# Patient Record
Sex: Female | Born: 1970
Health system: Southern US, Community
[De-identification: ages and names within clinical notes are randomized; demographics above are authoritative.]

## PROBLEM LIST (undated history)

## (undated) DIAGNOSIS — Z803 Family history of malignant neoplasm of breast: Secondary | ICD-10-CM

## (undated) DIAGNOSIS — Z923 Personal history of irradiation: Secondary | ICD-10-CM

## (undated) DIAGNOSIS — C50919 Malignant neoplasm of unspecified site of unspecified female breast: Secondary | ICD-10-CM

## (undated) DIAGNOSIS — O24419 Gestational diabetes mellitus in pregnancy, unspecified control: Secondary | ICD-10-CM

## (undated) HISTORY — PX: BREAST EXCISIONAL BIOPSY: SUR124

## (undated) HISTORY — DX: Malignant neoplasm of unspecified site of unspecified female breast: C50.919

## (undated) HISTORY — PX: BREAST BIOPSY: SHX20

## (undated) HISTORY — DX: Family history of malignant neoplasm of breast: Z80.3

---

## 2018-03-30 DIAGNOSIS — C50919 Malignant neoplasm of unspecified site of unspecified female breast: Secondary | ICD-10-CM

## 2018-03-30 HISTORY — DX: Malignant neoplasm of unspecified site of unspecified female breast: C50.919

## 2018-08-03 ENCOUNTER — Other Ambulatory Visit: Payer: Self-pay | Admitting: Family Medicine

## 2018-08-03 DIAGNOSIS — N6310 Unspecified lump in the right breast, unspecified quadrant: Secondary | ICD-10-CM

## 2018-08-09 ENCOUNTER — Other Ambulatory Visit: Payer: Self-pay

## 2018-08-12 ENCOUNTER — Other Ambulatory Visit: Payer: Self-pay

## 2018-08-30 ENCOUNTER — Ambulatory Visit
Admission: RE | Admit: 2018-08-30 | Discharge: 2018-08-30 | Disposition: A | Discharge status: Home / Self Care | Payer: Self-pay | Source: Ambulatory Visit | Attending: Family Medicine | Admitting: Family Medicine

## 2018-08-30 ENCOUNTER — Other Ambulatory Visit: Payer: Self-pay | Admitting: Family Medicine

## 2018-08-30 ENCOUNTER — Other Ambulatory Visit: Payer: Self-pay

## 2018-08-30 DIAGNOSIS — N6310 Unspecified lump in the right breast, unspecified quadrant: Secondary | ICD-10-CM

## 2018-08-30 DIAGNOSIS — R921 Mammographic calcification found on diagnostic imaging of breast: Secondary | ICD-10-CM

## 2018-08-30 IMAGING — MG DIGITAL DIAGNOSTIC BILATERAL MAMMOGRAM WITH TOMO AND CAD
8 of 11 series · 8 of 27 positions shown · non-contrast
Comparison: Outside mammogram [DATE]

CLINICAL DATA: 48-year-old patient presents for palpable area of
concern in the axillary tail of the right breast. She has cyclical
bilateral breast tenderness. Most recent mammogram [DATE].

EXAM:
DIGITAL DIAGNOSTIC BILATERAL MAMMOGRAM WITH CAD AND TOMO
ULTRASOUND RIGHT BREAST

[L CC (1 of 2)]
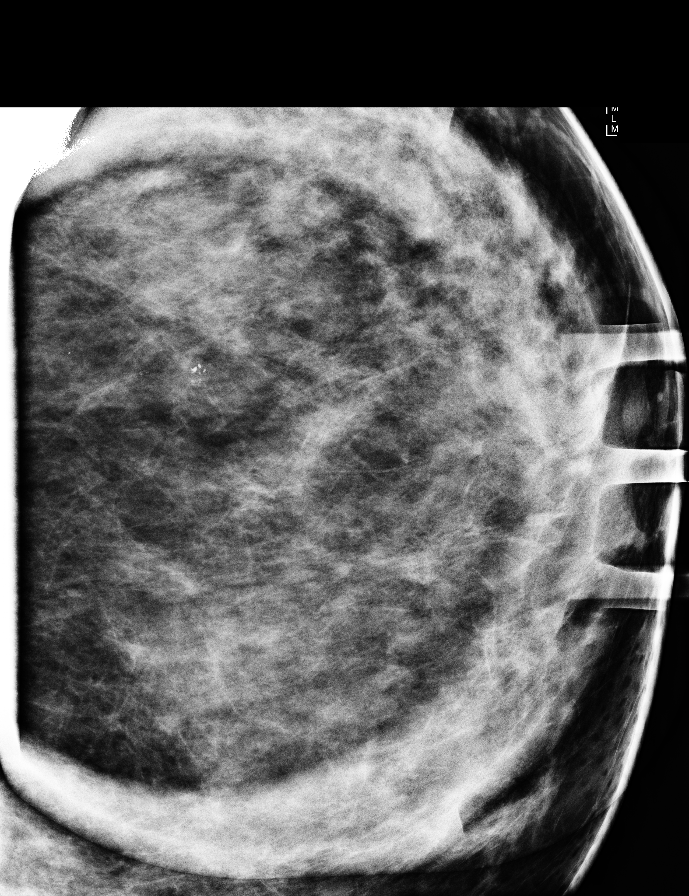

[L ML]
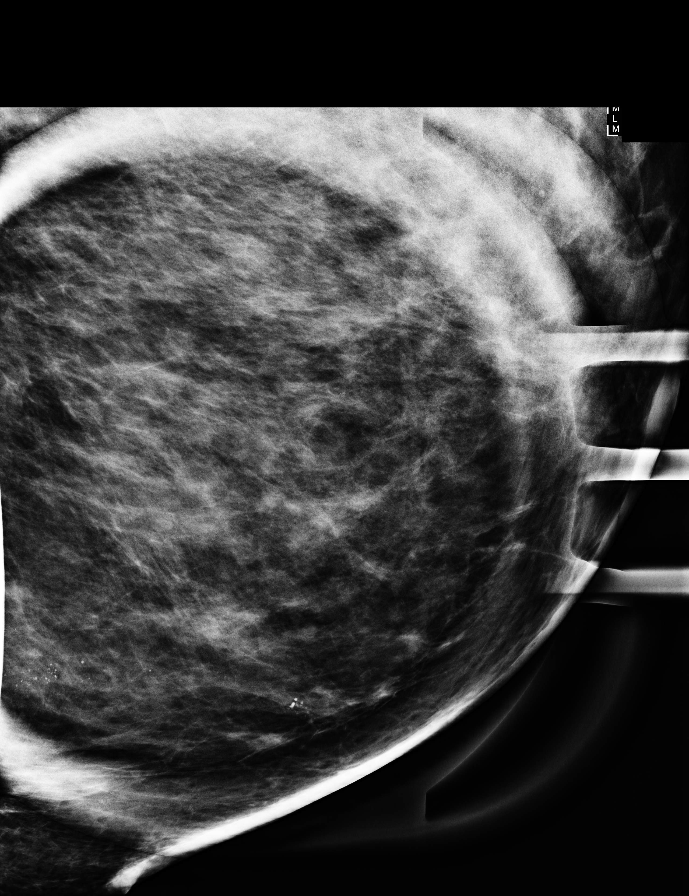

[L CC (2 of 2)]
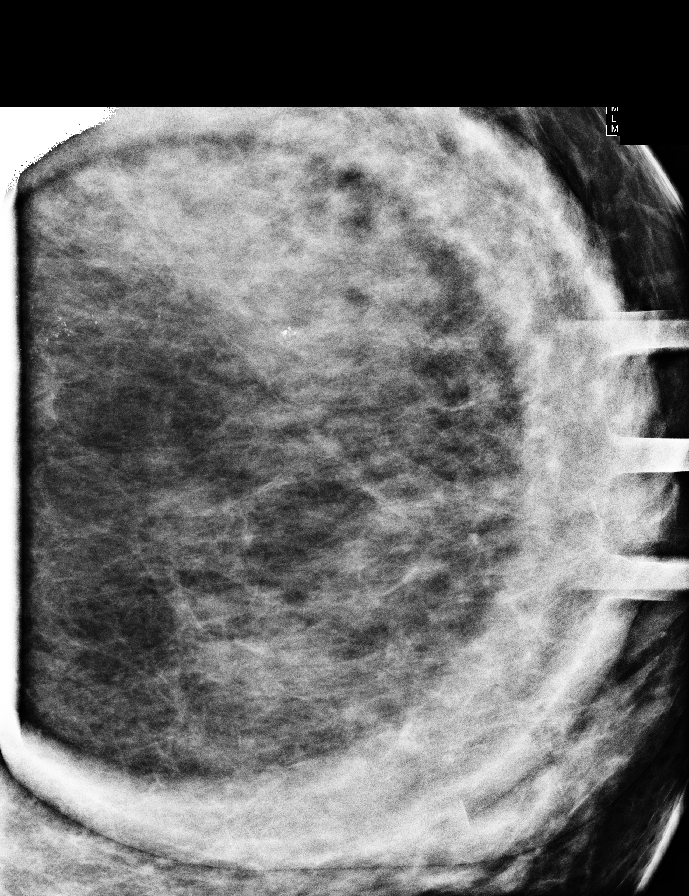

[R TAN synth-2D]
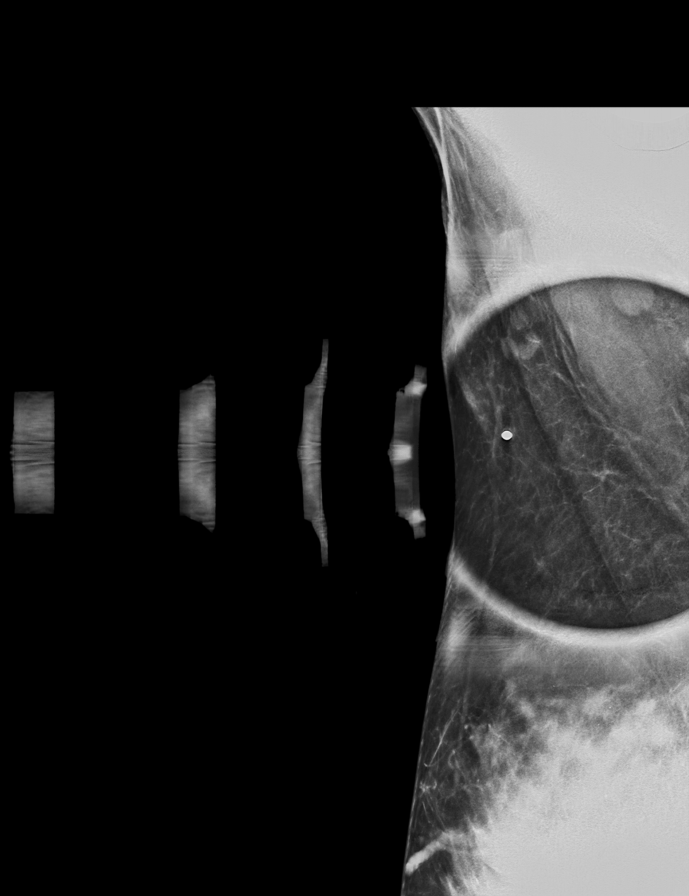

[R CC synth-2D]
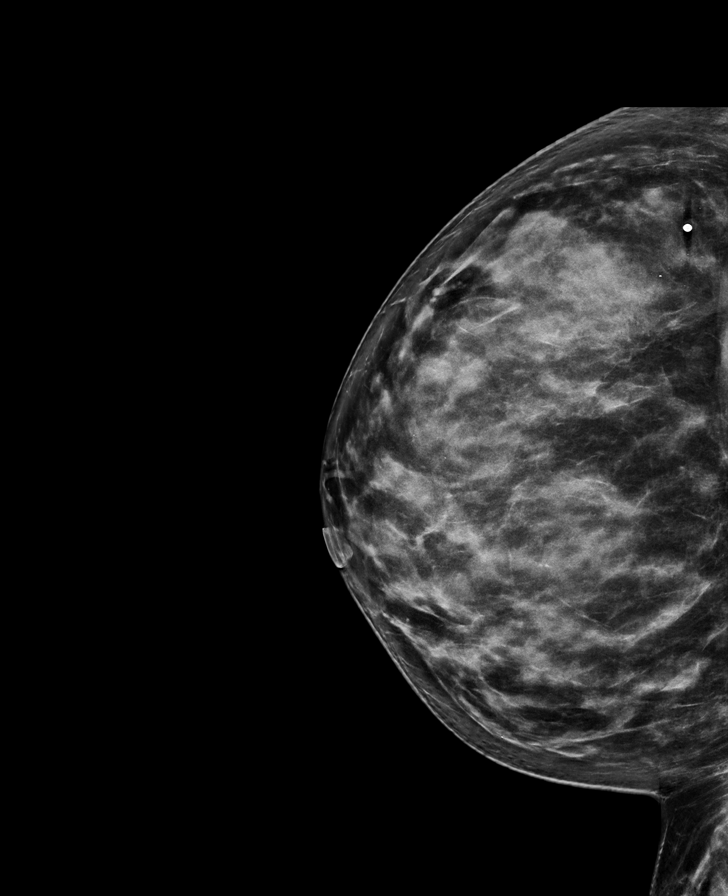

[L MLO synth-2D]
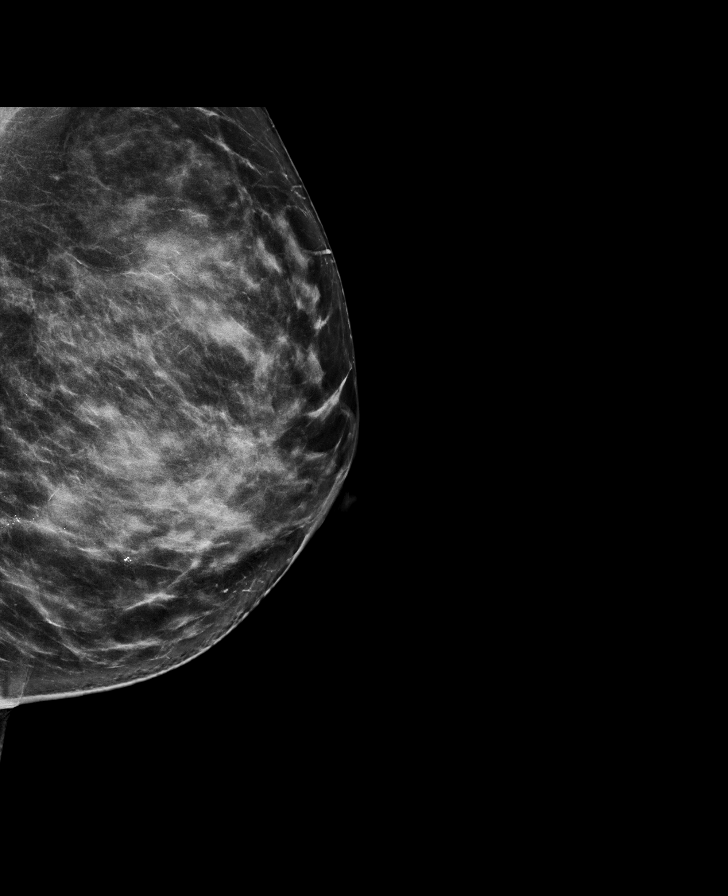

[L CC synth-2D]
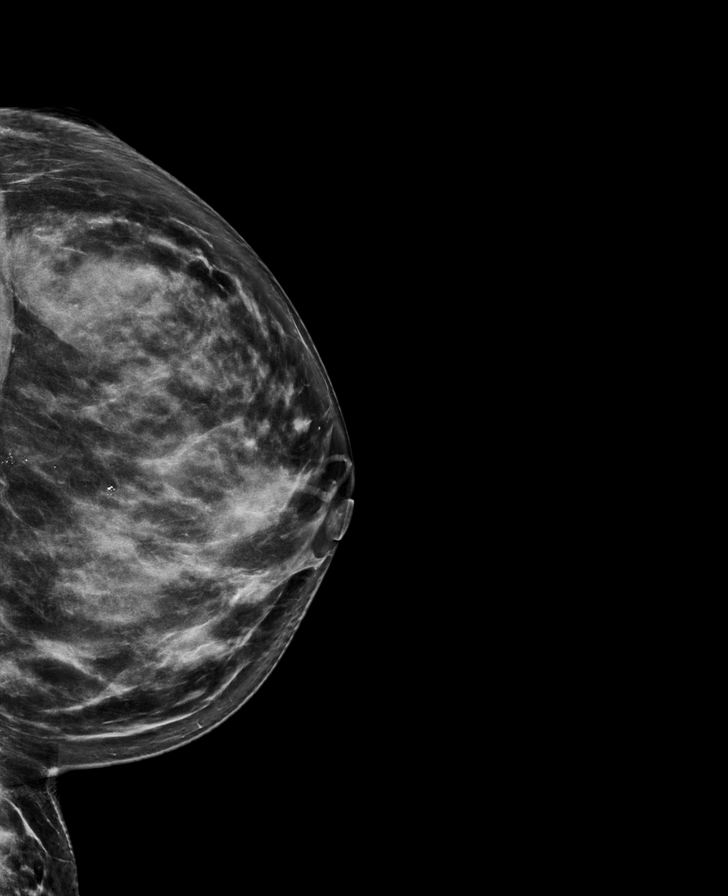

[L CC tomo · tomo slice 43/84.0]
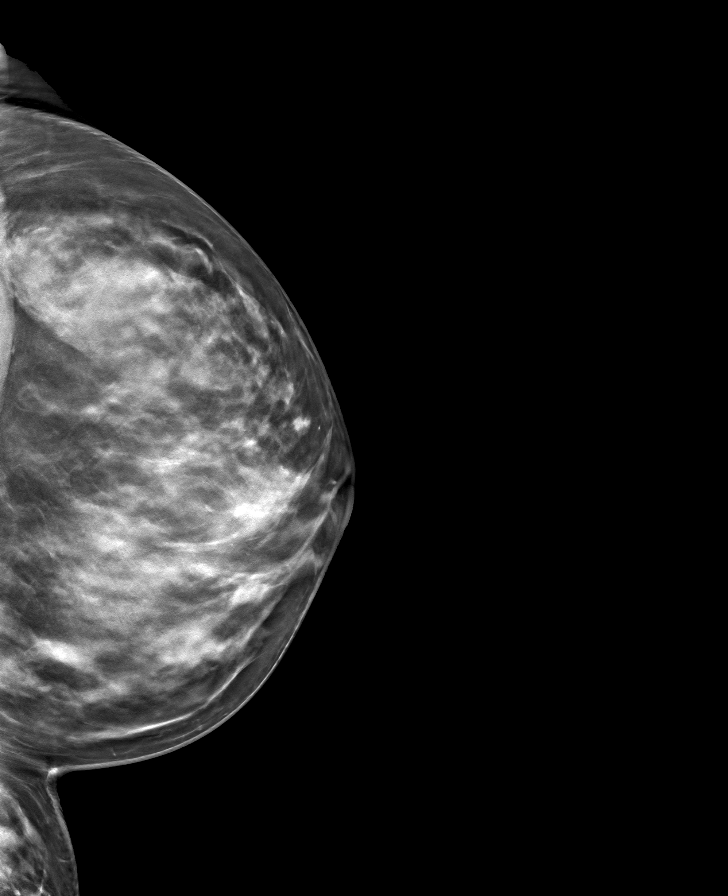

[8 of 27 positions shown; findings below may reference images not displayed]

ACR Breast Density Category c: The breast tissue is heterogeneously
dense, which may obscure small masses.
FINDINGS: Metallic skin marker was placed in the region of patient concern in
the right axillary tail. Spot compression view of this region shows
normal predominately fatty tissue and more superiorly are normal
appearing of right axillary lymph nodes. No mass, suspicious
microcalcification, or architectural distortion is identified in the
right breast.

No mass or distortion is identified on the left. Magnification views
were performed of calcifications identified posteriorly in the
inferior central/slightly outer left breast. There is a 0.3 cm group
of coarse heterogeneous calcifications in the middle third of the
breast parenchyma. Directly posterior to this is a larger group of
heterogeneous calcifications, spanning 1.2 cm, and there are a few
calcifications positioned in between the two discrete groups. These
calcifications are in a ductal distribution and in total span 4 cm
anterior to posterior diameter.

Mammographic images were processed with CAD.

On physical exam, I palpate a smooth, oval mobile mass in
approximately 11 o'clock position right breast 11 cm from the
nipple.

Targeted ultrasound is performed, showing a circumscribed oval fatty
mass parallel to the chest wall at approximately 11 o'clock position
11 cm from the nipple. This mass measures 2.9 x 3.1 x 0.7 cm. This
palpable mass has imaging features consistent with a benign lipoma.
IMPRESSION: Suspicious heterogeneous microcalcifications in a ductal
distribution in the inferior central/slightly outer left breast for
which ductal carcinoma in situ cannot be excluded.

3.1 cm benign palpable lipoma in the axillary tail of the right
breast.

No evidence of malignancy in the right breast.

RECOMMENDATION:
Two left breast stereotactic biopsies are recommended to sample the
anterior and posterior extent of the suspicious microcalcifications.
These biopsies are being scheduled for the patient.

I have discussed the findings and recommendations with the patient.
Results were also provided in writing at the conclusion of the
visit. If applicable, a reminder letter will be sent to the patient
regarding the next appointment.

BI-RADS CATEGORY  4: Suspicious.

## 2018-08-30 IMAGING — US ULTRASOUND RIGHT BREAST LIMITED
1 series · 6 of 6 positions shown · non-contrast
Comparison: Outside mammogram [DATE]

CLINICAL DATA: 48-year-old patient presents for palpable area of
concern in the axillary tail of the right breast. She has cyclical
bilateral breast tenderness. Most recent mammogram [DATE].

EXAM:
DIGITAL DIAGNOSTIC BILATERAL MAMMOGRAM WITH CAD AND TOMO
ULTRASOUND RIGHT BREAST

[Series 1: ultrasound right breast limited · 0.06mm/px · 6 of 6 slices shown]
[im 1/6]
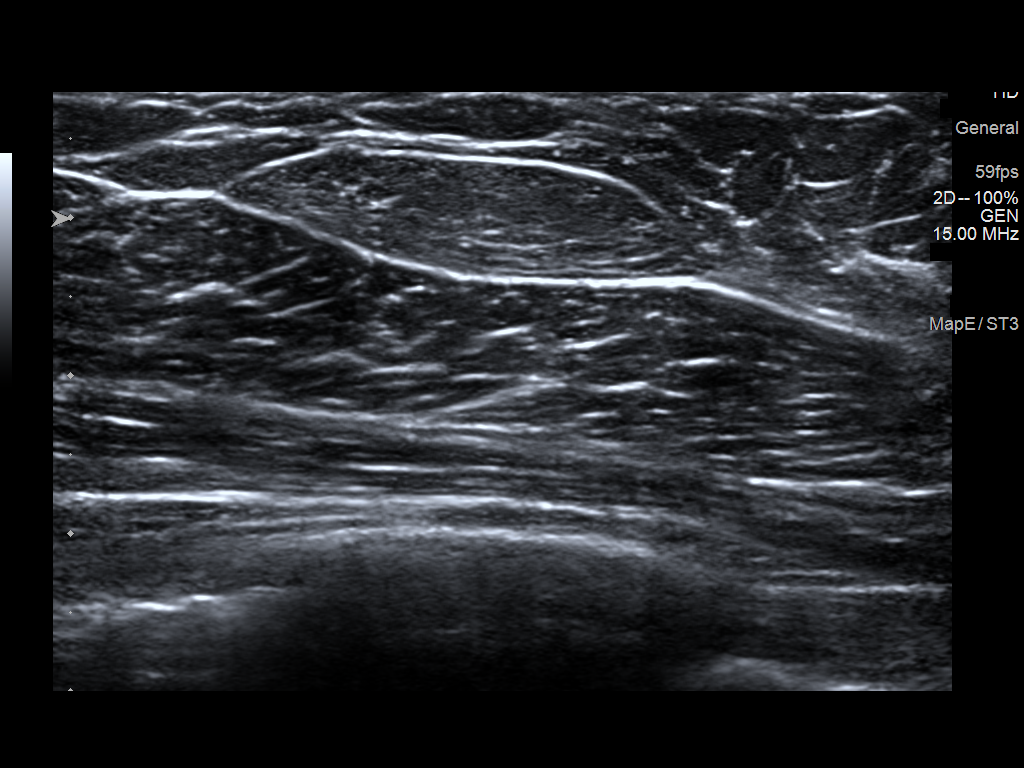
[im 2/6]
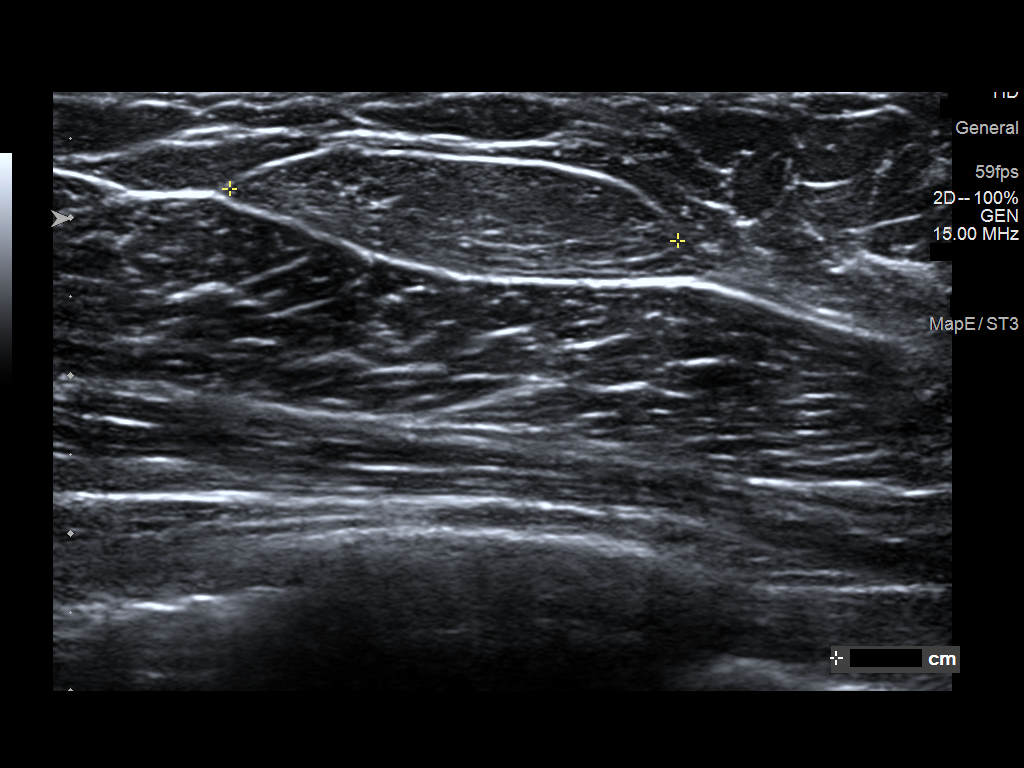
[im 3/6]
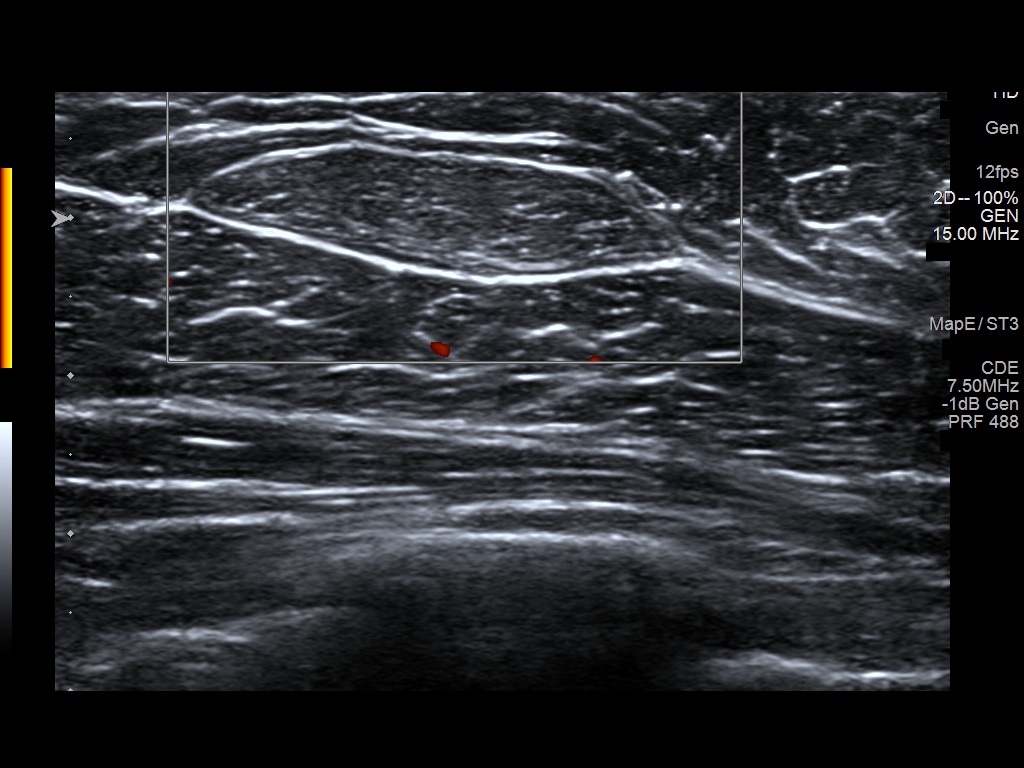
[im 4/6]
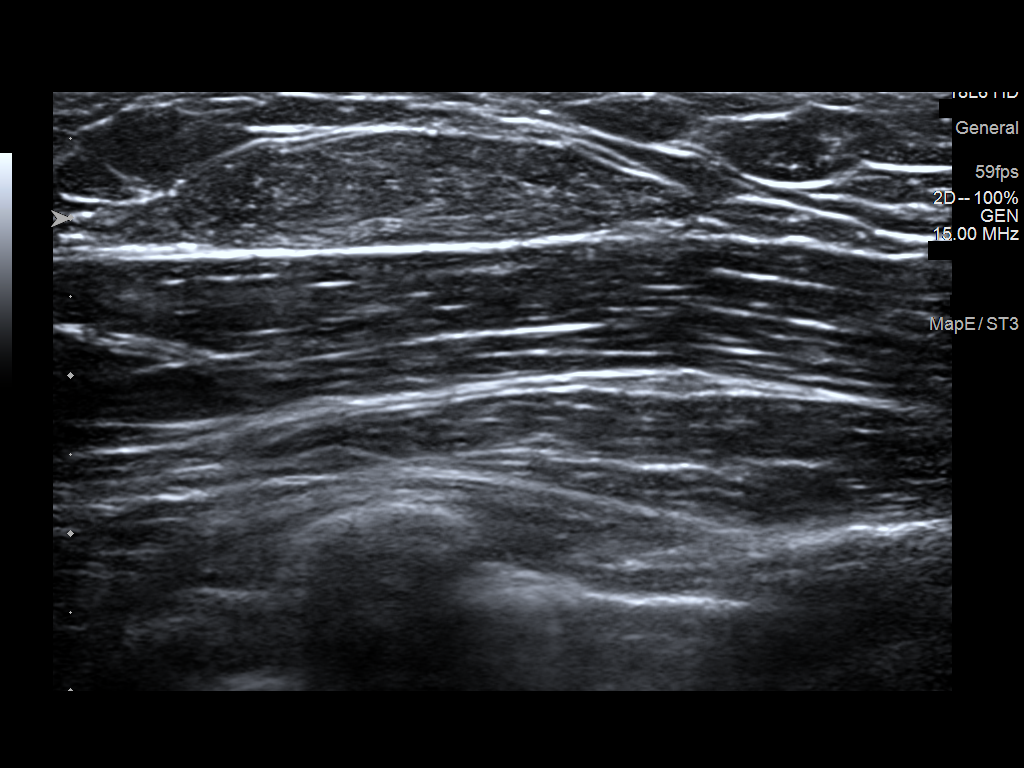
[im 5/6]
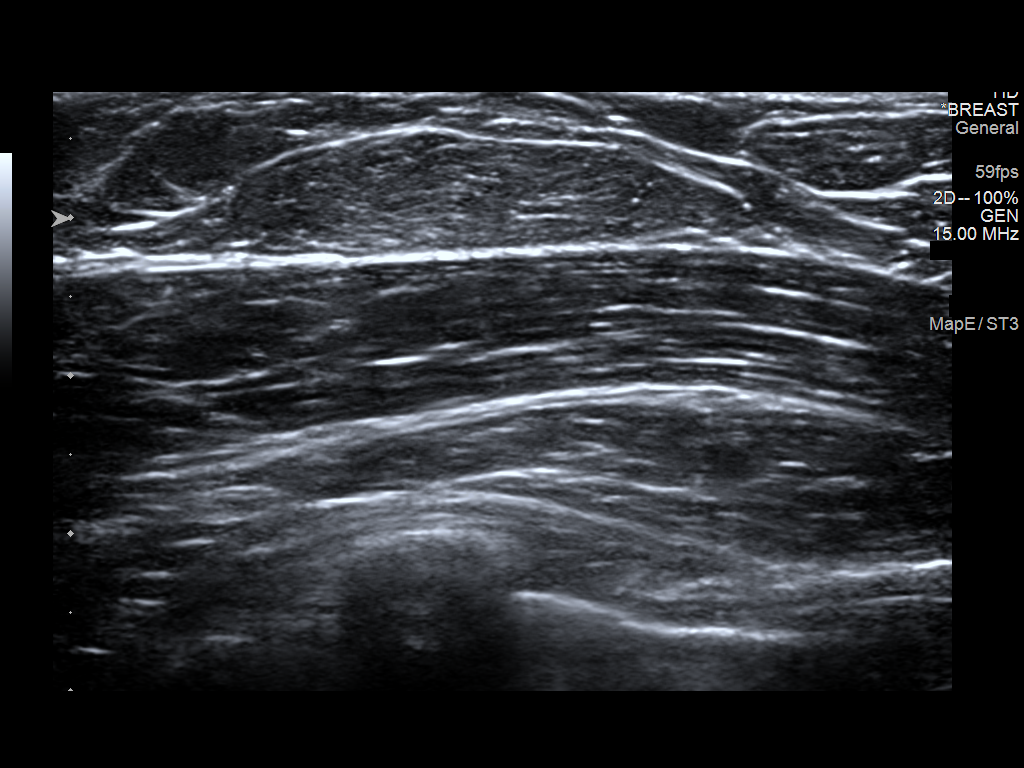
[im 6/6]
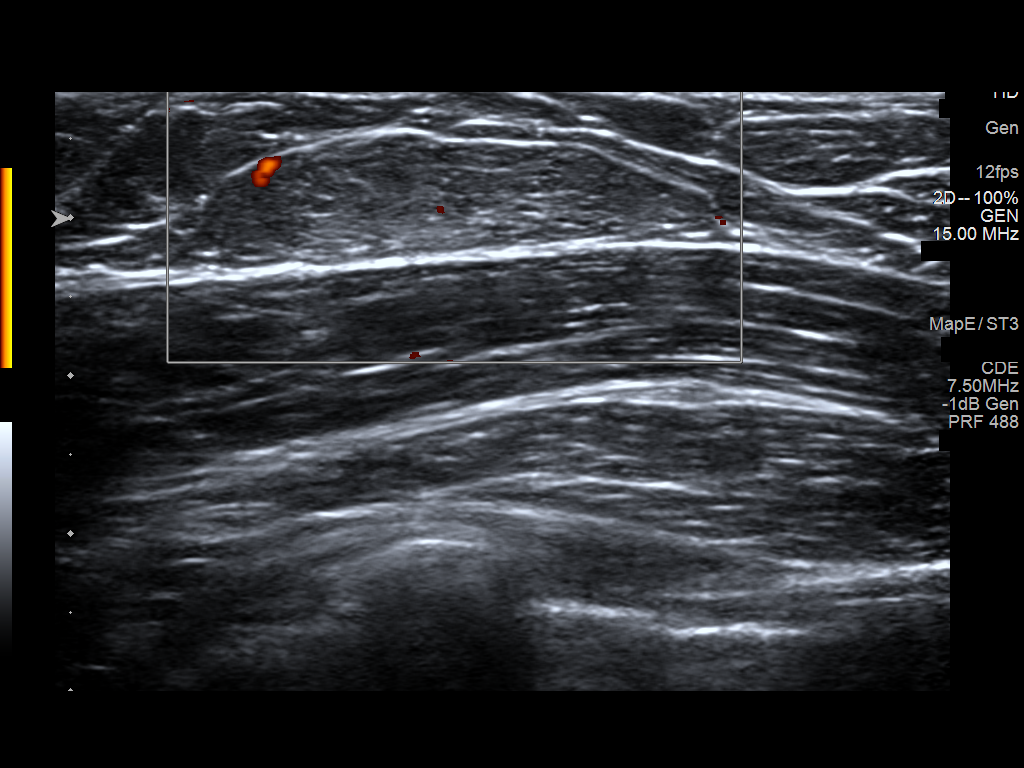

[6 of 6 positions shown; findings below may reference images not displayed]

ACR Breast Density Category c: The breast tissue is heterogeneously
dense, which may obscure small masses.
FINDINGS: Metallic skin marker was placed in the region of patient concern in
the right axillary tail. Spot compression view of this region shows
normal predominately fatty tissue and more superiorly are normal
appearing of right axillary lymph nodes. No mass, suspicious
microcalcification, or architectural distortion is identified in the
right breast.

No mass or distortion is identified on the left. Magnification views
were performed of calcifications identified posteriorly in the
inferior central/slightly outer left breast. There is a 0.3 cm group
of coarse heterogeneous calcifications in the middle third of the
breast parenchyma. Directly posterior to this is a larger group of
heterogeneous calcifications, spanning 1.2 cm, and there are a few
calcifications positioned in between the two discrete groups. These
calcifications are in a ductal distribution and in total span 4 cm
anterior to posterior diameter.

Mammographic images were processed with CAD.

On physical exam, I palpate a smooth, oval mobile mass in
approximately 11 o'clock position right breast 11 cm from the
nipple.

Targeted ultrasound is performed, showing a circumscribed oval fatty
mass parallel to the chest wall at approximately 11 o'clock position
11 cm from the nipple. This mass measures 2.9 x 3.1 x 0.7 cm. This
palpable mass has imaging features consistent with a benign lipoma.
IMPRESSION: Suspicious heterogeneous microcalcifications in a ductal
distribution in the inferior central/slightly outer left breast for
which ductal carcinoma in situ cannot be excluded.

3.1 cm benign palpable lipoma in the axillary tail of the right
breast.

No evidence of malignancy in the right breast.

RECOMMENDATION:
Two left breast stereotactic biopsies are recommended to sample the
anterior and posterior extent of the suspicious microcalcifications.
These biopsies are being scheduled for the patient.

I have discussed the findings and recommendations with the patient.
Results were also provided in writing at the conclusion of the
visit. If applicable, a reminder letter will be sent to the patient
regarding the next appointment.

BI-RADS CATEGORY  4: Suspicious.

## 2018-09-01 ENCOUNTER — Other Ambulatory Visit: Payer: Self-pay

## 2018-09-01 ENCOUNTER — Ambulatory Visit
Admission: RE | Admit: 2018-09-01 | Discharge: 2018-09-01 | Disposition: A | Discharge status: Home / Self Care | Payer: 59 | Source: Ambulatory Visit | Attending: Family Medicine | Admitting: Family Medicine

## 2018-09-01 DIAGNOSIS — R921 Mammographic calcification found on diagnostic imaging of breast: Secondary | ICD-10-CM

## 2018-09-01 IMAGING — MG MM BREAST BX W LOC DEV EA AD LESION IMG BX SPEC STEREO GUIDE*L*
5 of 8 series · 5 of 24 positions shown · non-contrast
Comparison: Previous exams.
COMPARISON: Previous exams.

Addendum:
CLINICAL DATA: Patient presents for stereotactic core needle biopsy
of 2 groups of microcalcifications over the lower central left
breast

EXAM:
LEFT BREAST STEREOTACTIC CORE NEEDLE BIOPSY

[L LM (1 of 4)]
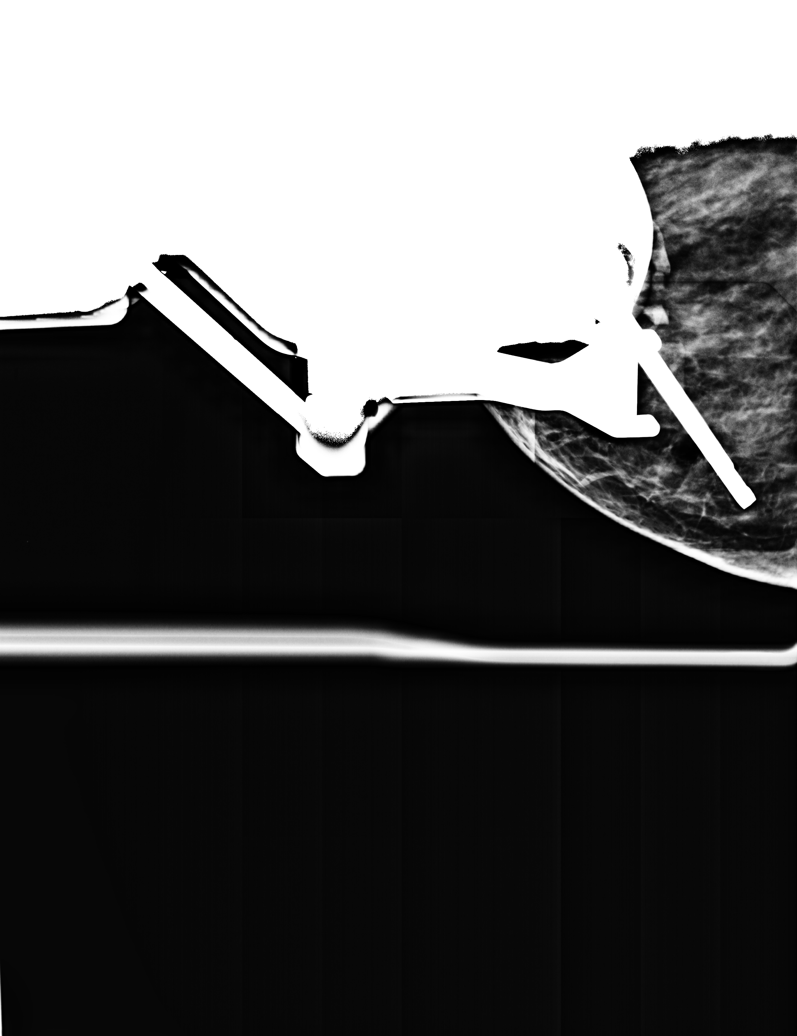

[L LM (2 of 4)]
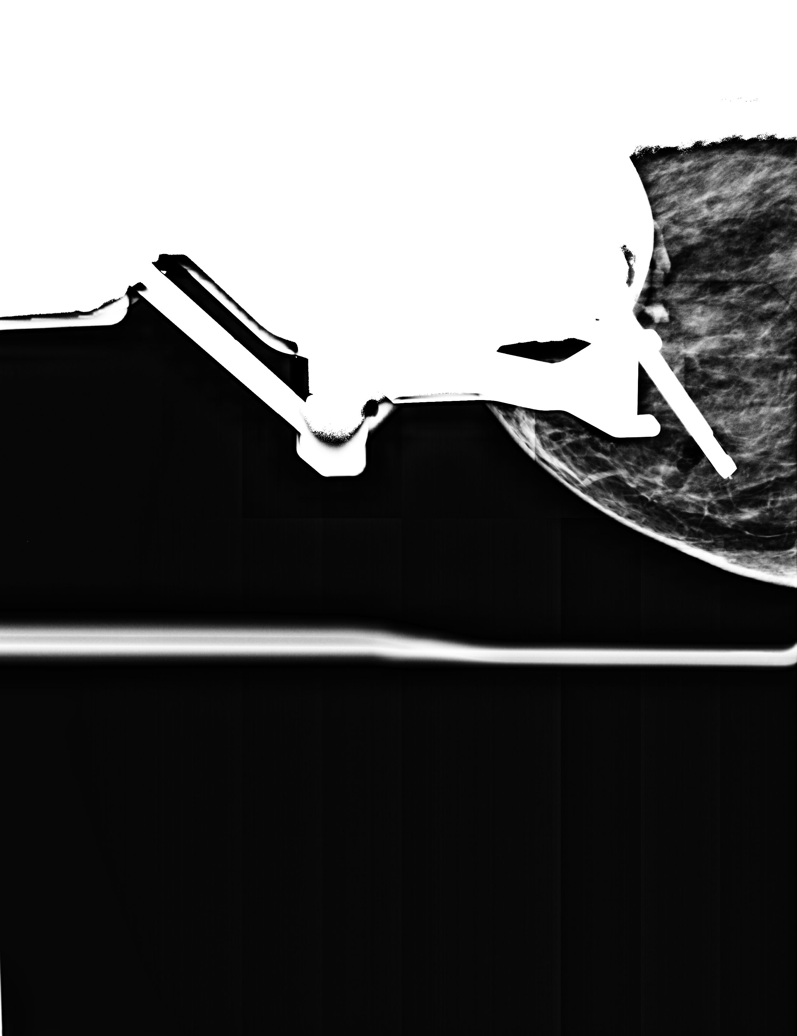

[L LM (3 of 4)]
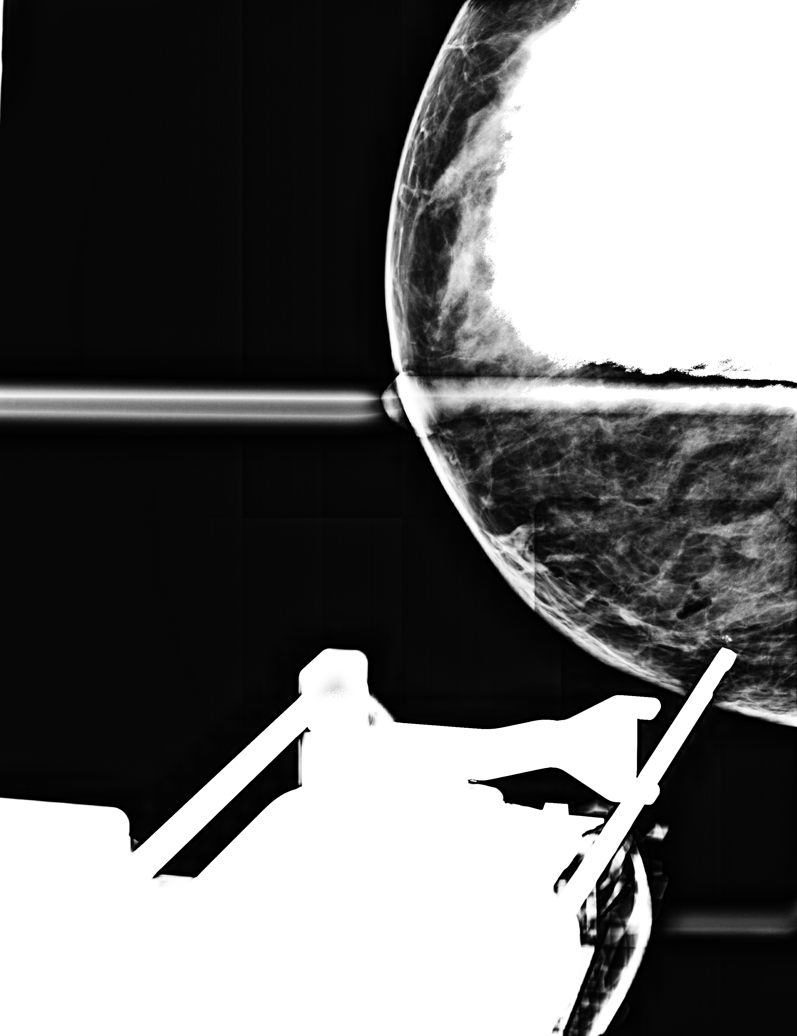

[L LM (4 of 4)]
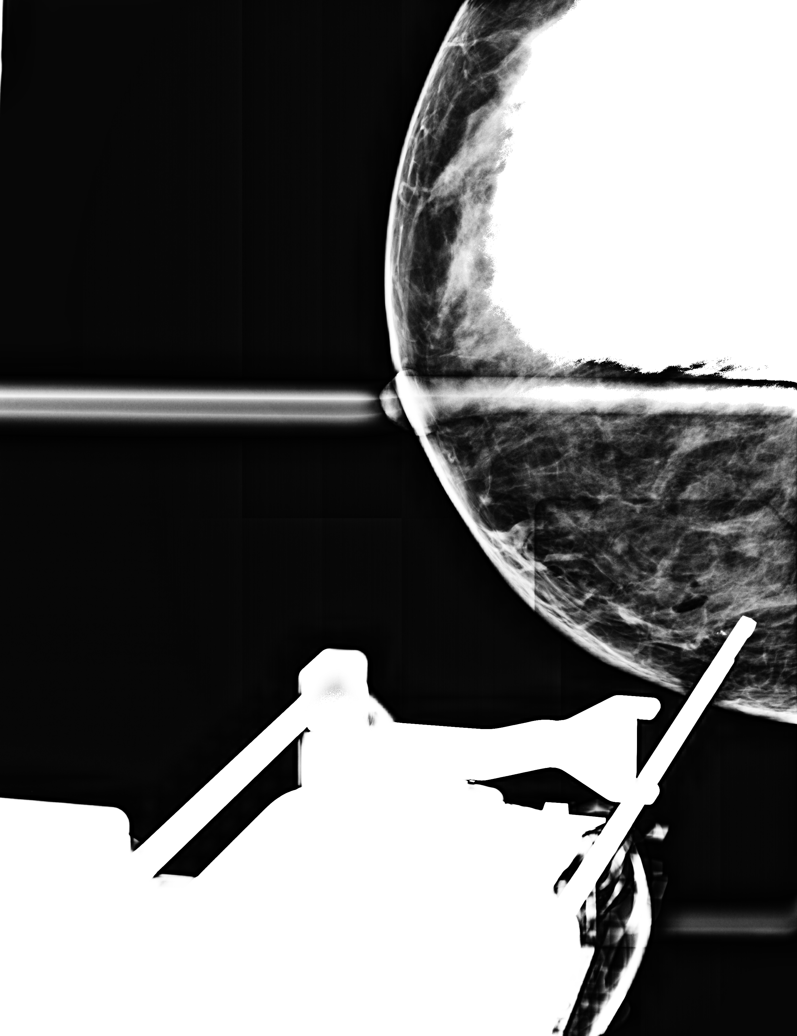

[L LM tomo · tomo slice 29/56.0]
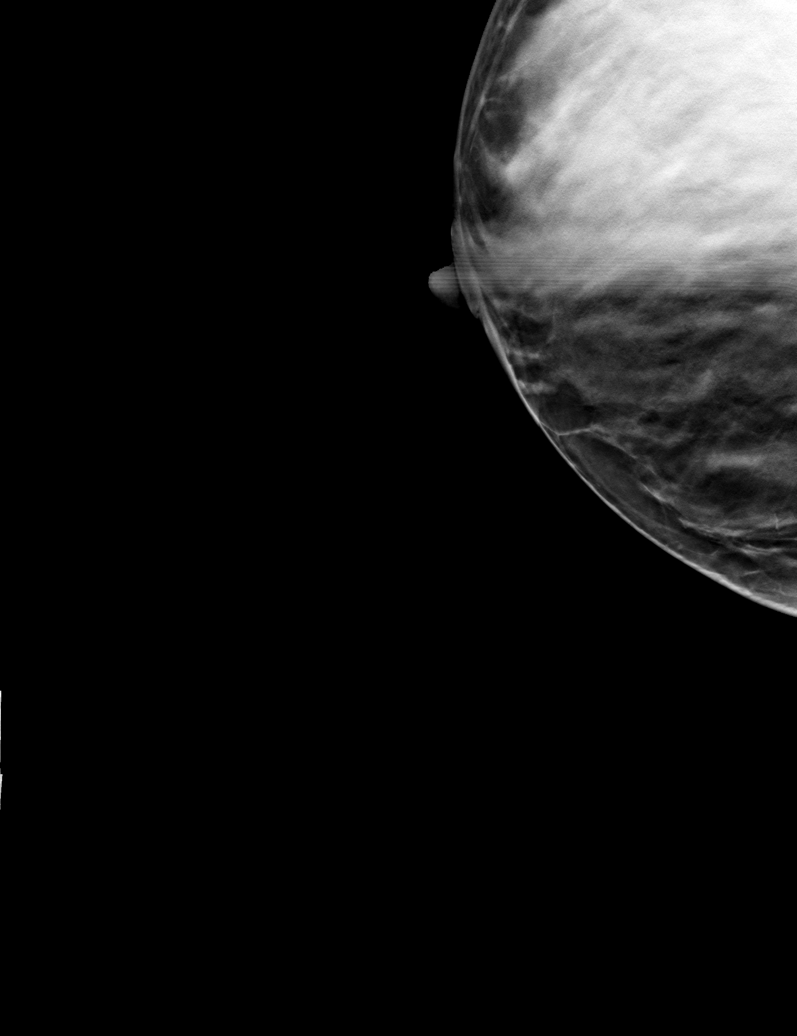

[5 of 24 positions shown; findings below may reference images not displayed]



Using sterile technique and 1% Lidocaine as local anesthetic, under
stereotactic guidance, a 9 gauge vacuum assisted device was used to
perform core needle biopsy of the targeted microcalcifications over
the posterior third of the lower central left breast using a lateral
to medial approach. Specimen radiograph was performed showing
multiple of the targeted microcalcifications. Specimens with
calcifications are identified for pathology.

Lesion quadrant: Left lower inner quadrant (6-7 o'clock position).

At the conclusion of the procedure, a coil shaped tissue marker clip
was deployed into the biopsy cavity. Follow-up 2-view mammogram was
performed and dictated separately.

Using sterile technique and 1% Lidocaine as local anesthetic, under
stereotactic guidance, a 9 gauge vacuum assisted device was used to
perform core needle biopsy of the targeted microcalcifications over
the middle third of the lower central left breast using a lateral to
medial approach. Specimen radiograph was performed showing multiple
of the targeted microcalcifications. Specimens with calcifications
are identified for pathology.

Lesion quadrant: Left lower inner quadrant (6-7 o'clock position).

At the conclusion of the procedure, a X shaped tissue marker clip
was deployed into the biopsy cavity. Follow-up 2-view mammogram was
performed and dictated separately.
IMPRESSION: Stereotactic-guided biopsy of 2 groups of microcalcifications over
the lower central left breast as described. No apparent
complications.

ADDENDUM:
Pathology revealed DUCTAL CARCINOMA IN SITU, CALCIFICATIONS of the
Left breast, both locations, lower central, anterior and posterior.
This was found to be concordant by Dr. CALEN.

Pathology results were discussed with the patient and her husband by
telephone. The patient reported doing well after the biopsies with
tenderness at the sites. Post biopsy instructions and care were
reviewed and questions were answered. The patient was encouraged to
call The [REDACTED] for any additional
concerns.

The patient was referred to [REDACTED]
[REDACTED] at [REDACTED] on
[DATE]

Pathology results reported by CALEN, RN on [DATE].



Using sterile technique and 1% Lidocaine as local anesthetic, under
stereotactic guidance, a 9 gauge vacuum assisted device was used to
perform core needle biopsy of the targeted microcalcifications over
the posterior third of the lower central left breast using a lateral
to medial approach. Specimen radiograph was performed showing
multiple of the targeted microcalcifications. Specimens with
calcifications are identified for pathology.

Lesion quadrant: Left lower inner quadrant (6-7 o'clock position).

At the conclusion of the procedure, a coil shaped tissue marker clip
was deployed into the biopsy cavity. Follow-up 2-view mammogram was
performed and dictated separately.

Using sterile technique and 1% Lidocaine as local anesthetic, under
stereotactic guidance, a 9 gauge vacuum assisted device was used to
perform core needle biopsy of the targeted microcalcifications over
the middle third of the lower central left breast using a lateral to
medial approach. Specimen radiograph was performed showing multiple
of the targeted microcalcifications. Specimens with calcifications
are identified for pathology.

Lesion quadrant: Left lower inner quadrant (6-7 o'clock position).

At the conclusion of the procedure, a X shaped tissue marker clip
was deployed into the biopsy cavity. Follow-up 2-view mammogram was
performed and dictated separately.
IMPRESSION: Stereotactic-guided biopsy of 2 groups of microcalcifications over
the lower central left breast as described. No apparent
complications.

## 2018-09-01 IMAGING — MG STEREOTACTIC CORE NEEDLE BIOPSY
3 series · 3 of 3 positions shown · non-contrast
Comparison: Previous exams.
COMPARISON: Previous exams.

Addendum:
CLINICAL DATA: Patient presents for stereotactic core needle biopsy
of 2 groups of microcalcifications over the lower central left
breast

EXAM:
LEFT BREAST STEREOTACTIC CORE NEEDLE BIOPSY

[L (1 of 3)]
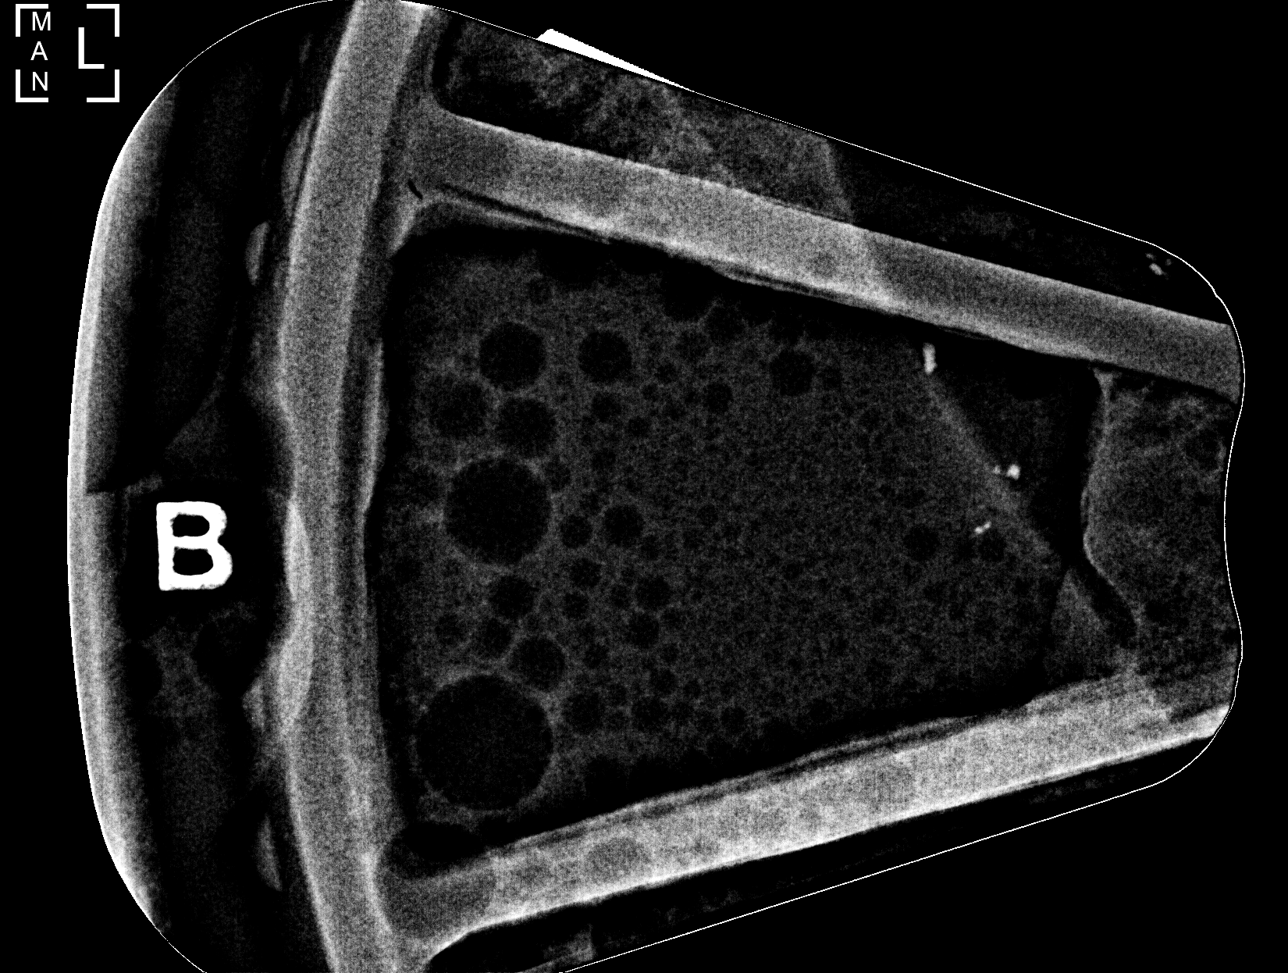

[L (2 of 3)]
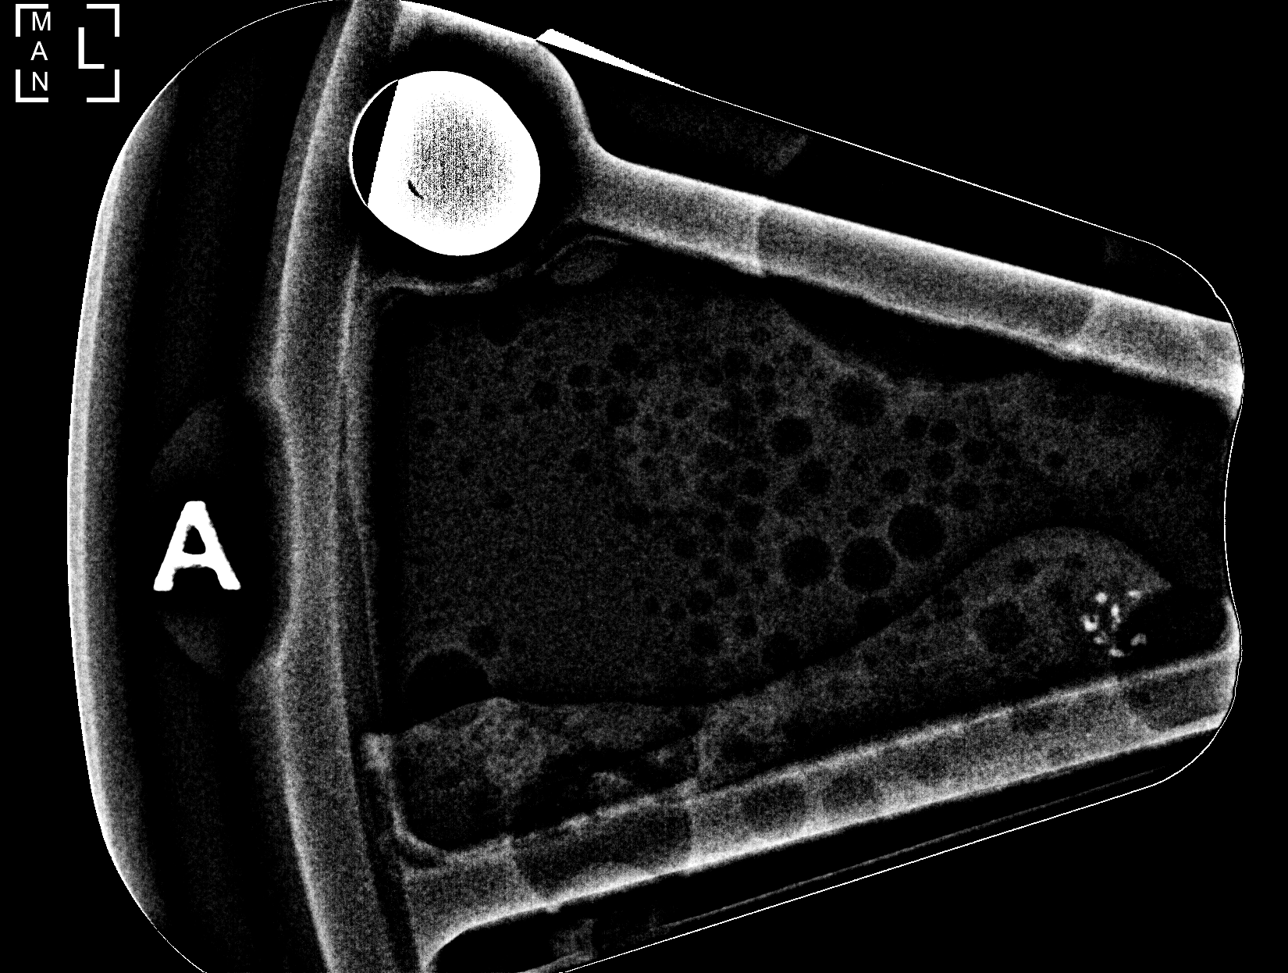

[L (3 of 3)]
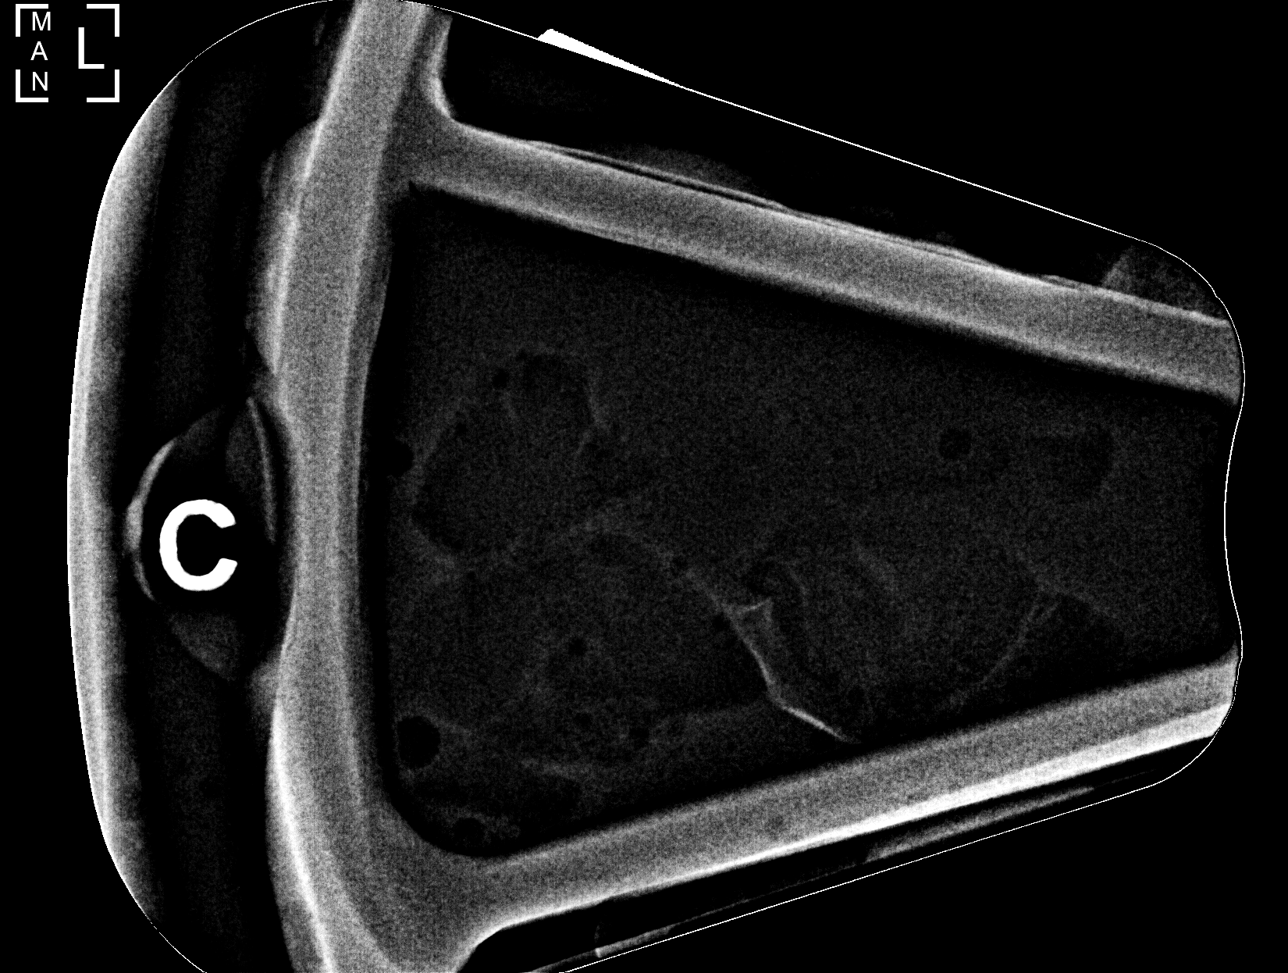

[3 of 3 positions shown; findings below may reference images not displayed]



Using sterile technique and 1% Lidocaine as local anesthetic, under
stereotactic guidance, a 9 gauge vacuum assisted device was used to
perform core needle biopsy of the targeted microcalcifications over
the posterior third of the lower central left breast using a lateral
to medial approach. Specimen radiograph was performed showing
multiple of the targeted microcalcifications. Specimens with
calcifications are identified for pathology.

Lesion quadrant: Left lower inner quadrant (6-7 o'clock position).

At the conclusion of the procedure, a coil shaped tissue marker clip
was deployed into the biopsy cavity. Follow-up 2-view mammogram was
performed and dictated separately.

Using sterile technique and 1% Lidocaine as local anesthetic, under
stereotactic guidance, a 9 gauge vacuum assisted device was used to
perform core needle biopsy of the targeted microcalcifications over
the middle third of the lower central left breast using a lateral to
medial approach. Specimen radiograph was performed showing multiple
of the targeted microcalcifications. Specimens with calcifications
are identified for pathology.

Lesion quadrant: Left lower inner quadrant (6-7 o'clock position).

At the conclusion of the procedure, a X shaped tissue marker clip
was deployed into the biopsy cavity. Follow-up 2-view mammogram was
performed and dictated separately.
IMPRESSION: Stereotactic-guided biopsy of 2 groups of microcalcifications over
the lower central left breast as described. No apparent
complications.

ADDENDUM:
Pathology revealed DUCTAL CARCINOMA IN SITU, CALCIFICATIONS of the
Left breast, both locations, lower central, anterior and posterior.
This was found to be concordant by Dr. CALEN.

Pathology results were discussed with the patient and her husband by
telephone. The patient reported doing well after the biopsies with
tenderness at the sites. Post biopsy instructions and care were
reviewed and questions were answered. The patient was encouraged to
call The [REDACTED] for any additional
concerns.

The patient was referred to [REDACTED]
[REDACTED] at [REDACTED] on
[DATE]

Pathology results reported by CALEN, RN on [DATE].



Using sterile technique and 1% Lidocaine as local anesthetic, under
stereotactic guidance, a 9 gauge vacuum assisted device was used to
perform core needle biopsy of the targeted microcalcifications over
the posterior third of the lower central left breast using a lateral
to medial approach. Specimen radiograph was performed showing
multiple of the targeted microcalcifications. Specimens with
calcifications are identified for pathology.

Lesion quadrant: Left lower inner quadrant (6-7 o'clock position).

At the conclusion of the procedure, a coil shaped tissue marker clip
was deployed into the biopsy cavity. Follow-up 2-view mammogram was
performed and dictated separately.

Using sterile technique and 1% Lidocaine as local anesthetic, under
stereotactic guidance, a 9 gauge vacuum assisted device was used to
perform core needle biopsy of the targeted microcalcifications over
the middle third of the lower central left breast using a lateral to
medial approach. Specimen radiograph was performed showing multiple
of the targeted microcalcifications. Specimens with calcifications
are identified for pathology.

Lesion quadrant: Left lower inner quadrant (6-7 o'clock position).

At the conclusion of the procedure, a X shaped tissue marker clip
was deployed into the biopsy cavity. Follow-up 2-view mammogram was
performed and dictated separately.
IMPRESSION: Stereotactic-guided biopsy of 2 groups of microcalcifications over
the lower central left breast as described. No apparent
complications.

## 2018-09-01 IMAGING — MG MM BREAST BX W LOC DEV EA AD LESION IMG BX SPEC STEREO GUIDE*L*
6 series · 6 of 6 positions shown · non-contrast
Comparison: Previous exams.
COMPARISON: Previous exams.

Addendum:
CLINICAL DATA: Patient presents for stereotactic core needle biopsy
of 2 groups of microcalcifications over the lower central left
breast

EXAM:
LEFT BREAST STEREOTACTIC CORE NEEDLE BIOPSY

[L (1 of 6)]
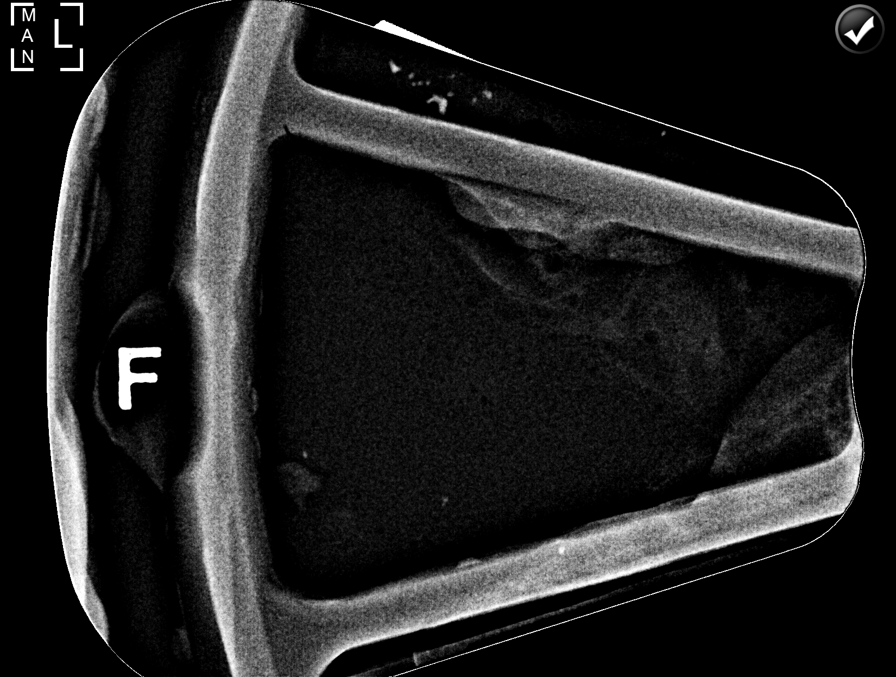

[L (2 of 6)]
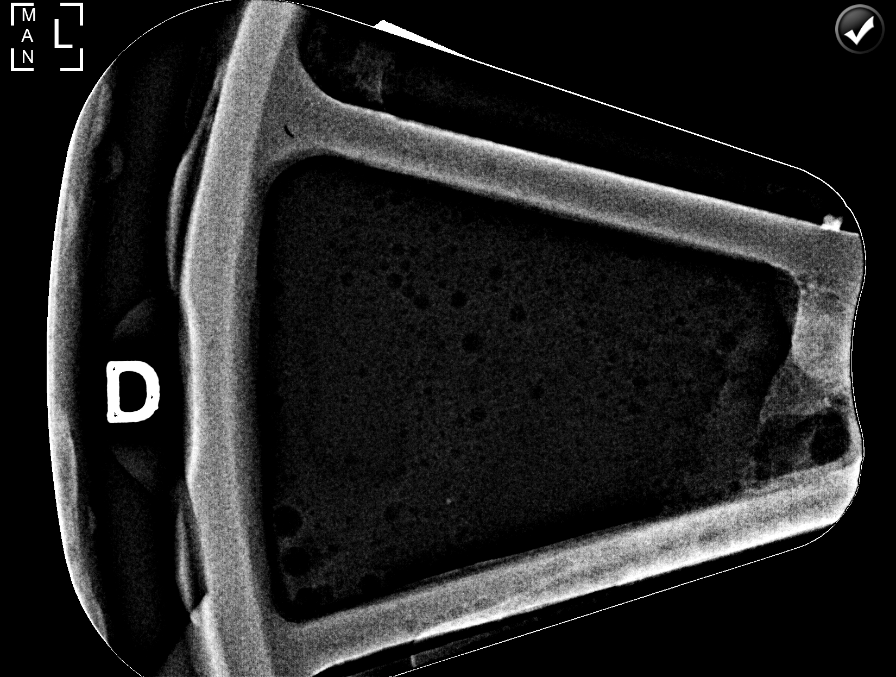

[L (3 of 6)]
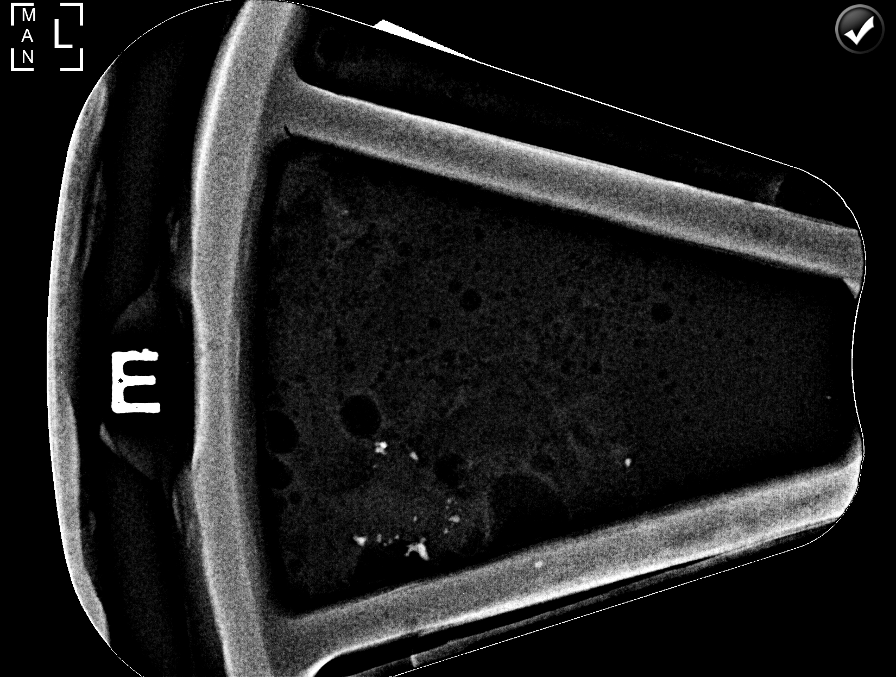

[L (4 of 6)]
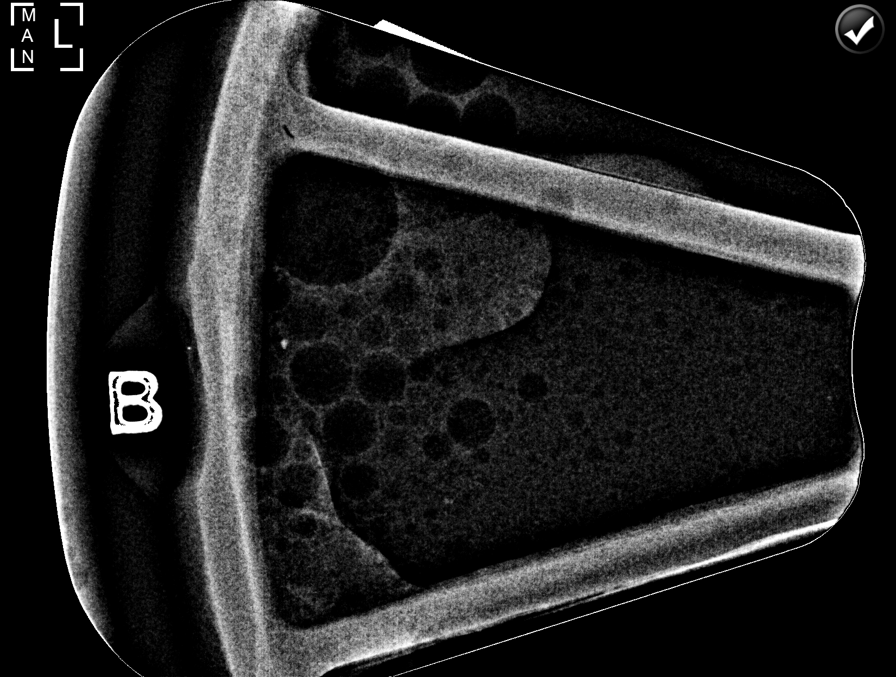

[L (5 of 6)]
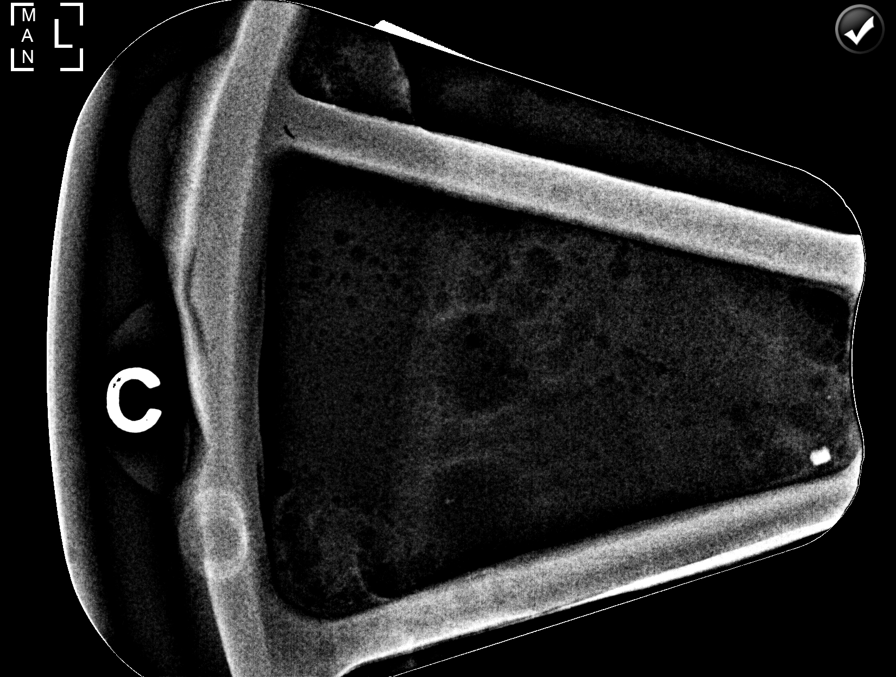

[L (6 of 6)]
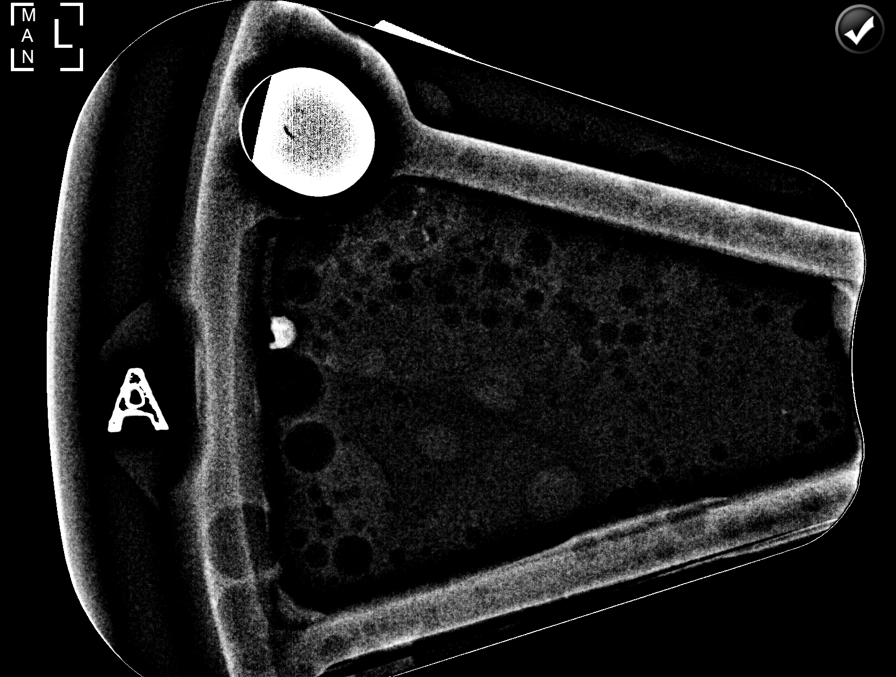

[6 of 6 positions shown; findings below may reference images not displayed]



Using sterile technique and 1% Lidocaine as local anesthetic, under
stereotactic guidance, a 9 gauge vacuum assisted device was used to
perform core needle biopsy of the targeted microcalcifications over
the posterior third of the lower central left breast using a lateral
to medial approach. Specimen radiograph was performed showing
multiple of the targeted microcalcifications. Specimens with
calcifications are identified for pathology.

Lesion quadrant: Left lower inner quadrant (6-7 o'clock position).

At the conclusion of the procedure, a coil shaped tissue marker clip
was deployed into the biopsy cavity. Follow-up 2-view mammogram was
performed and dictated separately.

Using sterile technique and 1% Lidocaine as local anesthetic, under
stereotactic guidance, a 9 gauge vacuum assisted device was used to
perform core needle biopsy of the targeted microcalcifications over
the middle third of the lower central left breast using a lateral to
medial approach. Specimen radiograph was performed showing multiple
of the targeted microcalcifications. Specimens with calcifications
are identified for pathology.

Lesion quadrant: Left lower inner quadrant (6-7 o'clock position).

At the conclusion of the procedure, a X shaped tissue marker clip
was deployed into the biopsy cavity. Follow-up 2-view mammogram was
performed and dictated separately.
IMPRESSION: Stereotactic-guided biopsy of 2 groups of microcalcifications over
the lower central left breast as described. No apparent
complications.

ADDENDUM:
Pathology revealed DUCTAL CARCINOMA IN SITU, CALCIFICATIONS of the
Left breast, both locations, lower central, anterior and posterior.
This was found to be concordant by Dr. CALEN.

Pathology results were discussed with the patient and her husband by
telephone. The patient reported doing well after the biopsies with
tenderness at the sites. Post biopsy instructions and care were
reviewed and questions were answered. The patient was encouraged to
call The [REDACTED] for any additional
concerns.

The patient was referred to [REDACTED]
[REDACTED] at [REDACTED] on
[DATE]

Pathology results reported by CALEN, RN on [DATE].



Using sterile technique and 1% Lidocaine as local anesthetic, under
stereotactic guidance, a 9 gauge vacuum assisted device was used to
perform core needle biopsy of the targeted microcalcifications over
the posterior third of the lower central left breast using a lateral
to medial approach. Specimen radiograph was performed showing
multiple of the targeted microcalcifications. Specimens with
calcifications are identified for pathology.

Lesion quadrant: Left lower inner quadrant (6-7 o'clock position).

At the conclusion of the procedure, a coil shaped tissue marker clip
was deployed into the biopsy cavity. Follow-up 2-view mammogram was
performed and dictated separately.

Using sterile technique and 1% Lidocaine as local anesthetic, under
stereotactic guidance, a 9 gauge vacuum assisted device was used to
perform core needle biopsy of the targeted microcalcifications over
the middle third of the lower central left breast using a lateral to
medial approach. Specimen radiograph was performed showing multiple
of the targeted microcalcifications. Specimens with calcifications
are identified for pathology.

Lesion quadrant: Left lower inner quadrant (6-7 o'clock position).

At the conclusion of the procedure, a X shaped tissue marker clip
was deployed into the biopsy cavity. Follow-up 2-view mammogram was
performed and dictated separately.
IMPRESSION: Stereotactic-guided biopsy of 2 groups of microcalcifications over
the lower central left breast as described. No apparent
complications.

## 2018-09-01 IMAGING — MG MM CLIP PLACEMENT
9 of 14 series · 9 of 38 positions shown · non-contrast
Comparison: Previous exam(s).

CLINICAL DATA: Patient is post stereotactic core needle biopsy of 2
groups of microcalcifications over the lower central left breast.

EXAM:
DIAGNOSTIC left MAMMOGRAM POST stereotactic BIOPSY

[L LMO (1 of 4)]
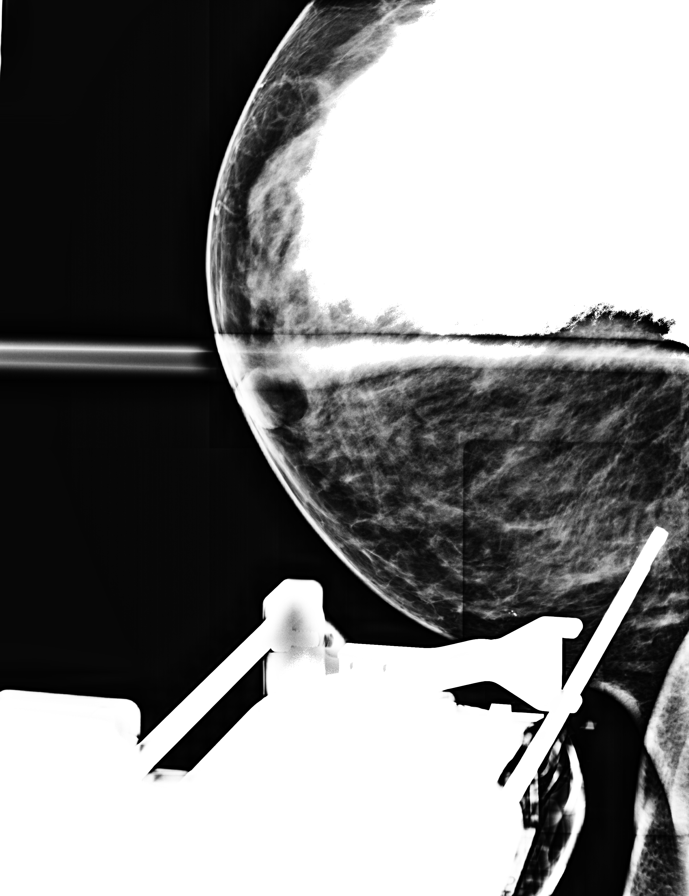

[L LM (1 of 2)]
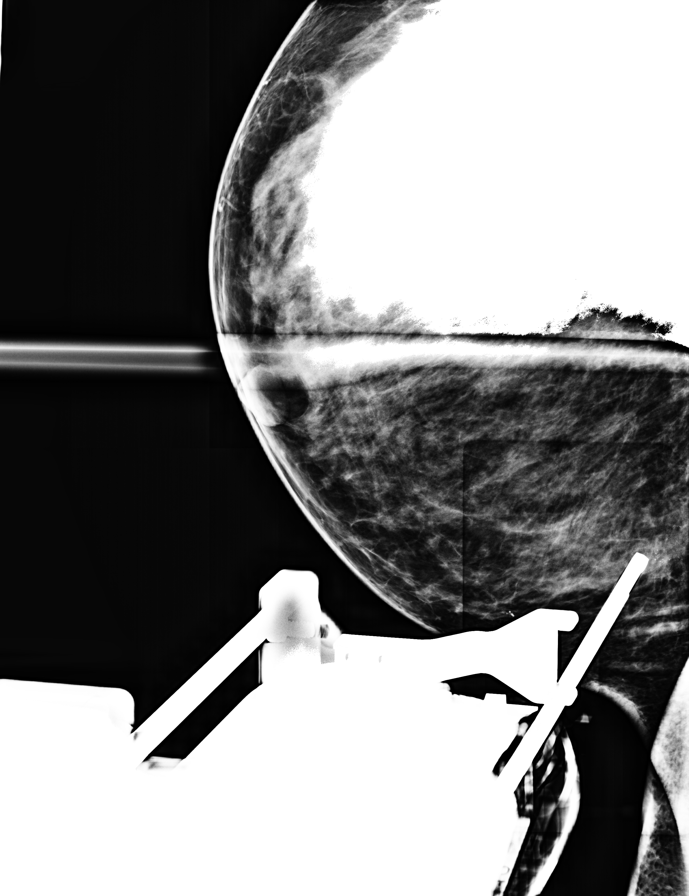

[L LMO (2 of 4)]
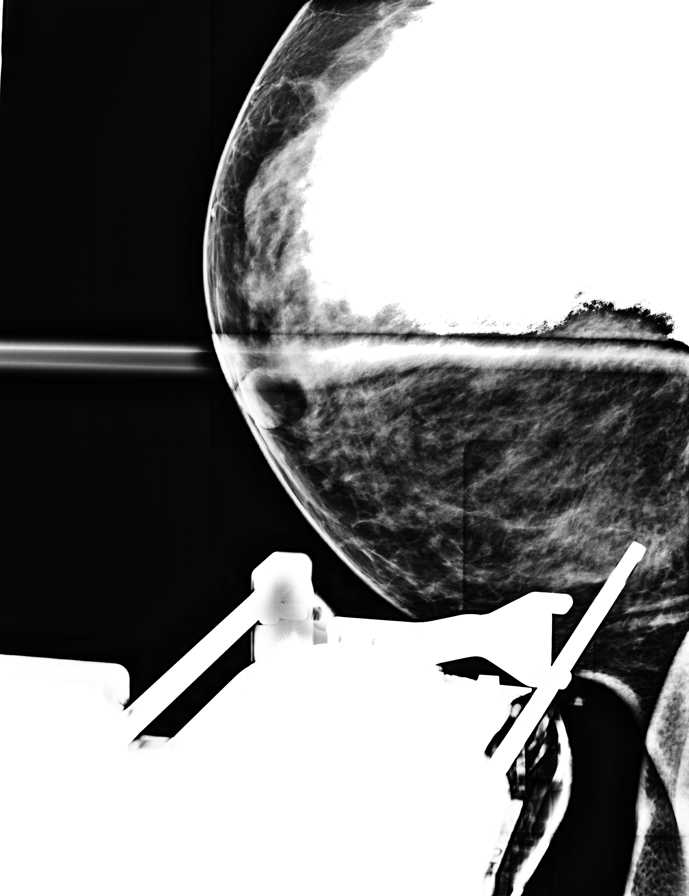

[L LMO (3 of 4)]
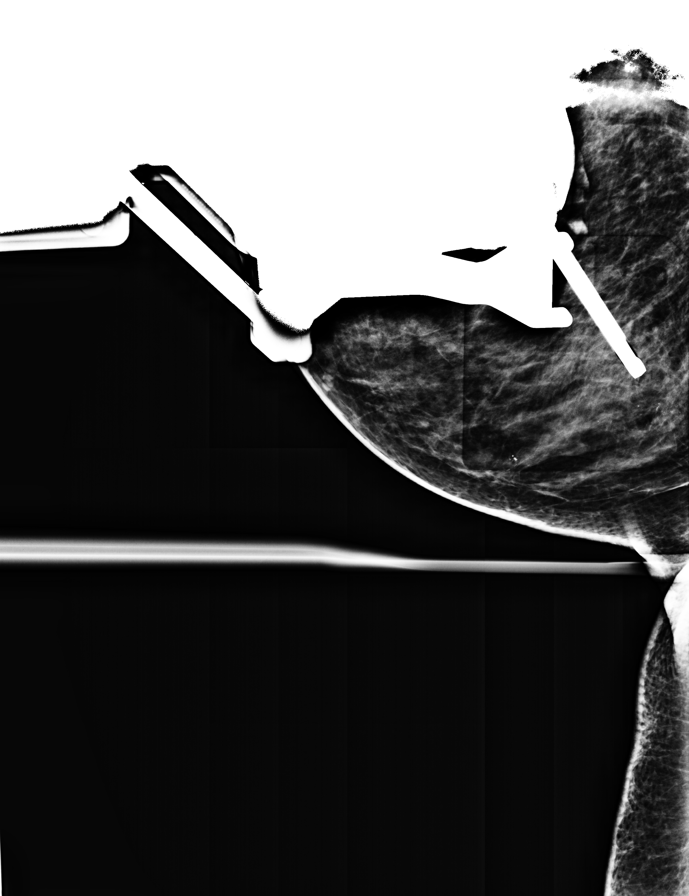

[L LMO (4 of 4)]
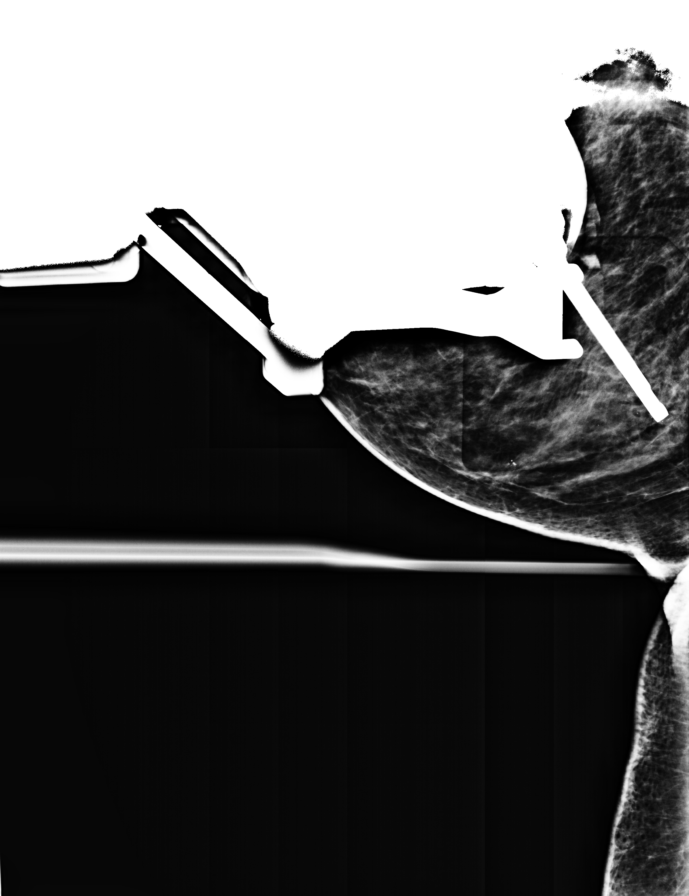

[L LM (2 of 2)]
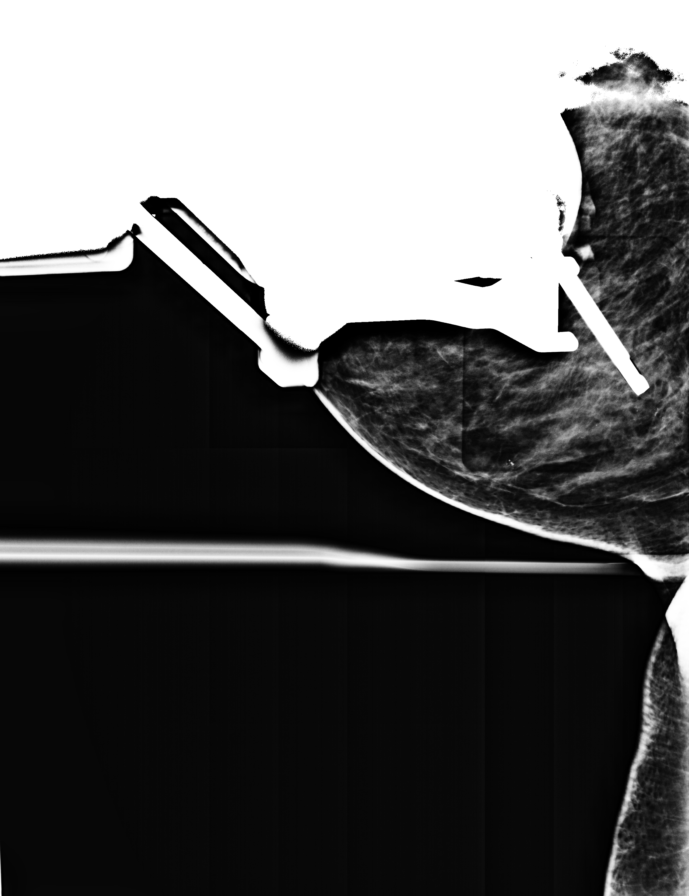

[L LM synth-2D]
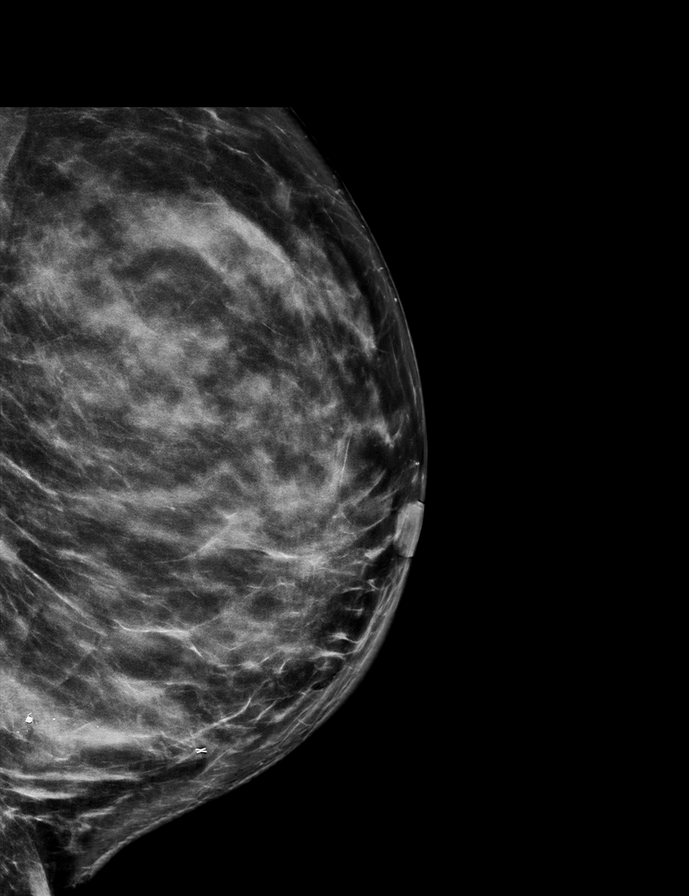

[L CC synth-2D]
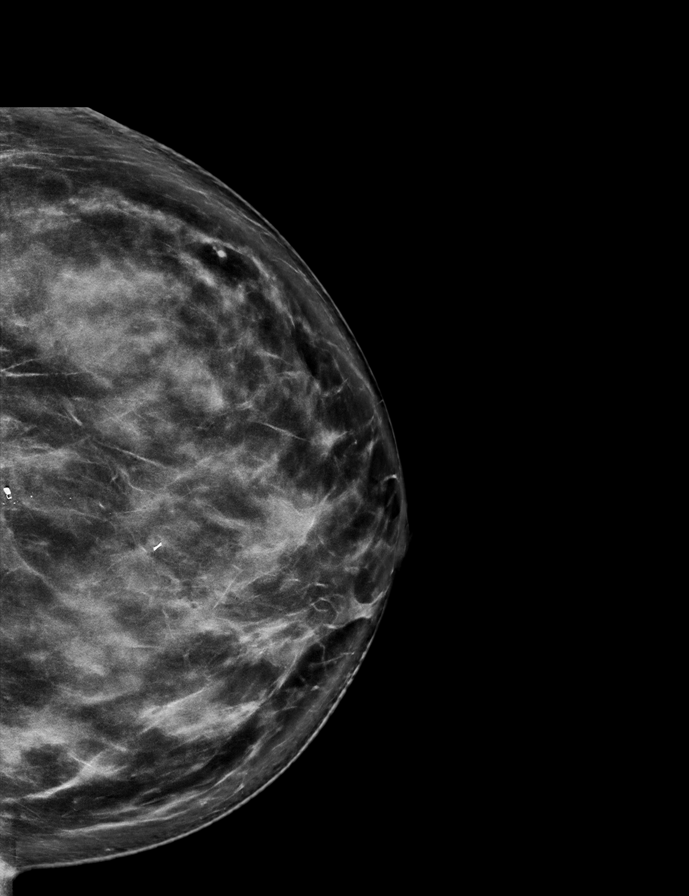

[L LMO TOMO_SCOUT BREAST TOMOSYNTHESIS IM tomo · tomo slice 38/75.0]
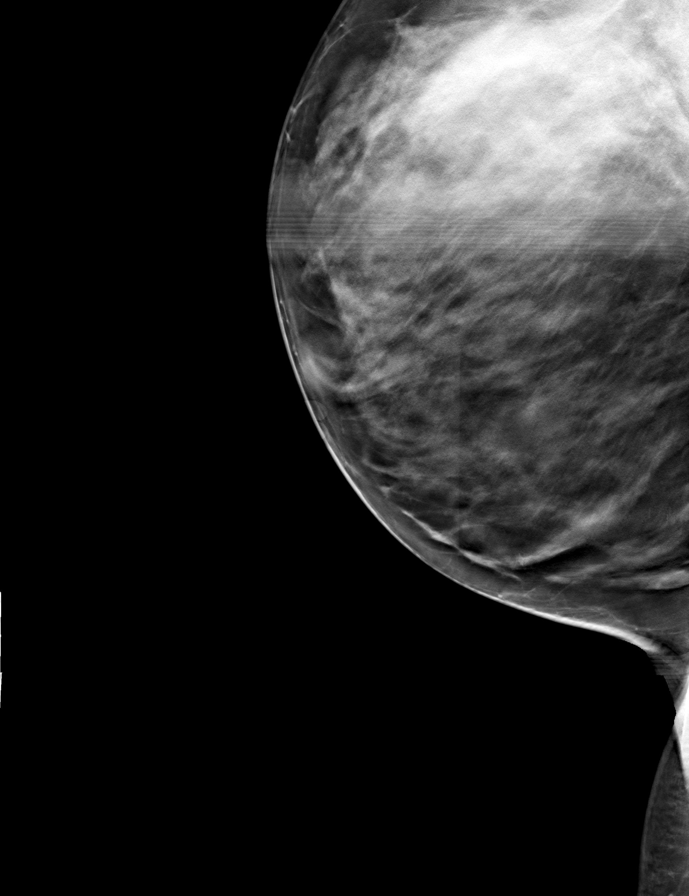

[9 of 38 positions shown; findings below may reference images not displayed]

FINDINGS: Mammographic images were obtained following stereotactic guided
biopsy of 2 groups of microcalcifications over the lower central
left breast. Exam demonstrates satisfactory placement of a coil
shaped metallic clip over the more posterior group of
microcalcifications and satisfactory placement of an X shaped
metallic clip over the more anterior group of microcalcifications.
IMPRESSION: Satisfactory clip placement post stereotactic core biopsy of 2
groups of microcalcifications over the lower central left breast.

Final Assessment: Post Procedure Mammograms for Marker Placement

## 2018-09-02 ENCOUNTER — Encounter: Payer: Self-pay | Admitting: *Deleted

## 2018-09-05 ENCOUNTER — Other Ambulatory Visit: Payer: Self-pay | Admitting: *Deleted

## 2018-09-05 ENCOUNTER — Telehealth: Payer: Self-pay | Admitting: Oncology

## 2018-09-05 DIAGNOSIS — D0512 Intraductal carcinoma in situ of left breast: Secondary | ICD-10-CM

## 2018-09-05 NOTE — Telephone Encounter (Signed)
Spoke to patient to confirm morning [facility] appointment for 6/10, packet emailed to patient

## 2018-09-06 NOTE — Progress Notes (Signed)
[place]  Telephone:([phone]) [phone] Fax:(336) [phone]     ID: Shelley Andrews DOB: 10-13-1970  MR#: [id number]  BPZ#:[id number]  Patient Care Team: Aretta Nip, MD as PCP - General (Family Medicine) Rockwell Germany, RN as Oncology Nurse Navigator Mauro Kaufmann, RN as Oncology Nurse Navigator [place] Carreno, Virgie Dad, MD as Consulting Physician (Oncology) Stark Klein, MD as Consulting Physician (General Surgery) Eppie Gibson, MD as Attending Physician (Radiation Oncology) Chauncey Cruel, MD OTHER MD:  CHIEF COMPLAINT: Ductal carcinoma in situ, estrogen receptor negative  CURRENT TREATMENT: Awaiting definitive surgery   HISTORY OF CURRENT ILLNESS: "Shelley Andrews" presented with a palpable area of concern in the axillary tail of the right breast, as well as cyclical bilateral breast tenderness. She underwent bilateral diagnostic mammography with tomography and right breast ultrasonography at [facility] on 08/30/2018 showing: breast density category C; suspicious heterogeneous microcalcifications in a ductal distribution in the inferior central/slightly outer left breast spanning a total of 4 cm; 3.1 cm benign palpable lipoma in the axillary tail of the right breast; no evidence of malignancy in the right breast, normal appearing lymph nodes.  Accordingly on 09/01/2018 she proceeded to biopsy of the left breast area in question. The pathology from this procedure ([id number]) showed: ductal carcinoma in situ with calcifications, grade 3 [per discussion at conference]. Prognostic indicators significant for: estrogen receptor, 0% negative and progesterone receptor, 0% negative.   The patient's subsequent history is as detailed below.   INTERVAL HISTORY: Shelley Andrews was evaluated in the multidisciplinary breast cancer clinic on 09/07/2018. Her case was also presented at the multidisciplinary breast cancer conference on the same day. At that time a preliminary plan was  proposed: MRI, consider genetics; no antiestrogens, consider adjuvant radiation   REVIEW OF SYSTEMS: Shelley Andrews reports having no current issues aside from the palpable lump in her breast. The patient denies unusual headaches, visual changes, nausea, vomiting, stiff neck, dizziness, or gait imbalance. There has been no cough, phlegm production, or pleurisy, no chest pain or pressure, and no change in bowel or bladder habits. The patient denies fever, rash, bleeding, unexplained fatigue or unexplained weight loss. A detailed review of systems was otherwise entirely negative.   PAST MEDICAL HISTORY: Past Medical History:  Diagnosis Date   Breast cancer ([place])    Family history of breast cancer     PAST SURGICAL HISTORY: Past Surgical History:  Procedure Laterality Date   CESAREAN SECTION      FAMILY HISTORY: Family History  Problem Relation Age of Onset   Stroke Mother    Lung cancer Father        unsure, possibly TB and not lung ca   Breast cancer Maternal Aunt    Patient's father was 88 years old when he died from lung problems. Patient is unsure if it was lung cancer or tuberculosis as he was a smoker and "had water in his lungs." Patient's mother died from a stroke at age 33. The patient notes a family hx of breast cancer in a maternal aunt who was diagnosed with breast cancer at an older age.  The patient has 9 siblings, 2 brothers and 7 sisters.  None of them have had cancer that she knows of.  She denies a family history of ovarian cancer.  GYNECOLOGIC HISTORY:  Patient's last menstrual period was 09/02/2018 (approximate).  Her periods are regular. Menarche: 48 years old Age at first live birth: 48 years old [place] P 1 LMP 09/02/2018, regular Contraceptive never used  SOCIAL HISTORY: (updated 09/07/2018)  [place]on[place] originally from [place]. She is currently working in Therapist, art. She is married. Husband Legrand Como is a Hotel manager. He was born in [place], but they moved here due  to expenses. She lives at home with her husband and son. Son Edison Nasuti is 73 years old.    ADVANCED DIRECTIVES: not in place; in the absence of any documents to the contrary her husband is her healthcare power of attorney   HEALTH MAINTENANCE: Social History   Tobacco Use   Smoking status: Former Smoker    Packs/day: 1.00    Last attempt to quit: 07/08/2018    Years since quitting: 0.1   Smokeless tobacco: Never Used  Substance Use Topics   Alcohol use: Not Currently   Drug use: Never     Colonoscopy: never done  PAP: 11/09/2011  Bone density: never done   No Known Allergies  No current outpatient medications on file.   No current facility-administered medications for this visit.     OBJECTIVE: Middle-aged Asian woman in no acute distress  Vitals:   09/07/18 0826  BP: 108/64  Pulse: 79  Resp: 18  Temp: 98.2 F (36.8 C)  SpO2: 99%     Body mass index is 23.92 kg/m.   Wt Readings from Last 3 Encounters:  09/07/18 122 lb 8 oz (55.6 kg)      ECOG FS:0 - Asymptomatic  Ocular: Sclerae unicteric, pupils round and equal Wearing a mask Lymphatic: No cervical or supraclavicular adenopathy Lungs no rales or rhonchi Heart regular rate and rhythm Abd soft, nontender, positive bowel sounds MSK no focal spinal tenderness, no joint edema Neuro: non-focal, well-oriented, appropriate affect Breasts: There is a lipoma in the axillary tail which I found difficult to palpate.  The right breast is otherwise unremarkable.  The left breast is status post recent biopsy.  There is a palpable mass associated with the Steri-Strips but this may be secondary to the procedure.  There are no skin or nipple changes of concern otherwise.  Both axillae are benign.   LAB RESULTS:  CMP     Component Value Date/Time   NA 141 09/07/2018 0804   K 3.6 09/07/2018 0804   CL 106 09/07/2018 0804   CO2 23 09/07/2018 0804   GLUCOSE 96 09/07/2018 0804   BUN 11 09/07/2018 0804   CREATININE 0.82  09/07/2018 0804   CALCIUM 9.1 09/07/2018 0804   PROT 7.7 09/07/2018 0804   ALBUMIN 4.4 09/07/2018 0804   AST 19 09/07/2018 0804   ALT 20 09/07/2018 0804   ALKPHOS 52 09/07/2018 0804   BILITOT 0.6 09/07/2018 0804   GFRNONAA >60 09/07/2018 0804   GFRAA >60 09/07/2018 0804    No results found for: TOTALPROTELP, ALBUMINELP, A1GS, A2GS, BETS, BETA2SER, GAMS, MSPIKE, SPEI  No results found for: KPAFRELGTCHN, LAMBDASER, KAPLAMBRATIO  Lab Results  Component Value Date   WBC 5.9 09/07/2018   NEUTROABS 3.6 09/07/2018   HGB 13.4 09/07/2018   HCT 41.1 09/07/2018   MCV 92.8 09/07/2018   PLT 321 09/07/2018    _0 @  No results found for: LABCA2  No components found for: [id number]  No results for input(s): INR in the last 168 hours.  No results found for: LABCA2  No results found for: TIR443  No results found for: XVQ008  No results found for: QPY195  No results found for: CA2729  No components found for: HGQUANT  No results found for: CEA1 / No results found for: CEA1  No results found for: AFPTUMOR  No results found for: [place]  No results found for: PSA1  Appointment on 09/07/2018  Component Date Value Ref Range Status   Sodium 09/07/2018 141  135 - 145 mmol/L Final   Potassium 09/07/2018 3.6  3.5 - 5.1 mmol/L Final   Chloride 09/07/2018 106  98 - 111 mmol/L Final   CO2 09/07/2018 23  22 - 32 mmol/L Final   Glucose, Bld 09/07/2018 96  70 - 99 mg/dL Final   BUN 09/07/2018 11  6 - 20 mg/dL Final   Creatinine 09/07/2018 0.82  0.44 - 1.00 mg/dL Final   Calcium 09/07/2018 9.1  8.9 - 10.3 mg/dL Final   Total Protein 09/07/2018 7.7  6.5 - 8.1 g/dL Final   Albumin 09/07/2018 4.4  3.5 - 5.0 g/dL Final   AST 09/07/2018 19  15 - 41 U/L Final   ALT 09/07/2018 20  0 - 44 U/L Final   Alkaline Phosphatase 09/07/2018 52  38 - 126 U/L Final   Total Bilirubin 09/07/2018 0.6  0.3 - 1.2 mg/dL Final   GFR, Est Non Af Am 09/07/2018 >60  >60 mL/min  Final   GFR, Est AFR Am 09/07/2018 >60  >60 mL/min Final   Anion gap 09/07/2018 12  5 - 15 Final   Performed at [facility], [place] [address], [place], [place] [address]   WBC Count 09/07/2018 5.9  4.0 - 10.5 K/uL Final   RBC 09/07/2018 4.43  3.87 - 5.11 MIL/uL Final   Hemoglobin 09/07/2018 13.4  12.0 - 15.0 g/dL Final   HCT 09/07/2018 41.1  36.0 - 46.0 % Final   MCV 09/07/2018 92.8  80.0 - 100.0 fL Final   MCH 09/07/2018 30.2  26.0 - 34.0 pg Final   MCHC 09/07/2018 32.6  30.0 - 36.0 g/dL Final   RDW 09/07/2018 12.5  11.5 - 15.5 % Final   Platelet Count 09/07/2018 321  150 - 400 K/uL Final   nRBC 09/07/2018 0.0  0.0 - 0.2 % Final   Neutrophils Relative % 09/07/2018 60  % Final   Neutro Abs 09/07/2018 3.6  1.7 - 7.7 K/uL Final   Lymphocytes Relative 09/07/2018 33  % Final   Lymphs Abs 09/07/2018 2.0  0.7 - 4.0 K/uL Final   Monocytes Relative 09/07/2018 5  % Final   Monocytes Absolute 09/07/2018 0.3  0.1 - 1.0 K/uL Final   Eosinophils Relative 09/07/2018 1  % Final   Eosinophils Absolute 09/07/2018 0.0  0.0 - 0.5 K/uL Final   Basophils Relative 09/07/2018 1  % Final   Basophils Absolute 09/07/2018 0.0  0.0 - 0.1 K/uL Final   Immature Granulocytes 09/07/2018 0  % Final   Abs Immature Granulocytes 09/07/2018 0.01  0.00 - 0.07 K/uL Final   Performed at [facility], [place] [address], [place], [place] [address]    (this displays the last labs from the last 3 days)  No results found for: TOTALPROTELP, ALBUMINELP, A1GS, A2GS, BETS, BETA2SER, GAMS, MSPIKE, SPEI (this displays SPEP labs)  No results found for: KPAFRELGTCHN, LAMBDASER, KAPLAMBRATIO (kappa/lambda light chains)  No results found for: HGBA, HGBA2QUANT, HGBFQUANT, HGBSQUAN (Hemoglobinopathy evaluation)   No results found for: LDH  No results found for: IRON, TIBC, IRONPCTSAT (Iron and TIBC)  No results found for: FERRITIN  Urinalysis No results  found for: COLORURINE, APPEARANCEUR, LABSPEC, [place], GLUCOSEU, [place], BILIRUBINUR, [place], [place], UROBILINOGEN, NITRITE, LEUKOCYTESUR   STUDIES: US Breast Ltd Uni Right Inc  Axilla  Result Date: 08/30/2018 CLINICAL DATA:  48 year old patient presents for palpable area of concern in the axillary tail of the right breast. She has cyclical bilateral breast tenderness. Most recent mammogram June 2018. EXAM: DIGITAL DIAGNOSTIC BILATERAL MAMMOGRAM WITH CAD AND TOMO ULTRASOUND RIGHT BREAST COMPARISON:  Outside mammogram August 29, 2016 ACR Breast Density Category c: The breast tissue is heterogeneously dense, which may obscure small masses. FINDINGS: Metallic skin marker was placed in the region of patient concern in the right axillary tail. Spot compression view of this region shows normal predominately fatty tissue and more superiorly are normal appearing of right axillary lymph nodes. No mass, suspicious microcalcification, or architectural distortion is identified in the right breast. No mass or distortion is identified on the left. Magnification views were performed of calcifications identified posteriorly in the inferior central/slightly outer left breast. There is a 0.3 cm group of coarse heterogeneous calcifications in the middle third of the breast parenchyma. Directly posterior to this is a larger group of heterogeneous calcifications, spanning 1.2 cm, and there are a few calcifications positioned in between the two discrete groups. These calcifications are in a ductal distribution and in total span 4 cm anterior to posterior diameter. Mammographic images were processed with CAD. On physical exam, I palpate a smooth, oval mobile mass in approximately 11 o'clock position right breast 11 cm from the nipple. Targeted ultrasound is performed, showing a circumscribed oval fatty mass parallel to the chest wall at approximately 11 o'clock position 11 cm from the nipple. This mass measures 2.9 x 3.1 x 0.7 cm.  This palpable mass has imaging features consistent with a benign lipoma. IMPRESSION: Suspicious heterogeneous microcalcifications in a ductal distribution in the inferior central/slightly outer left breast for which ductal carcinoma in situ cannot be excluded. 3.1 cm benign palpable lipoma in the axillary tail of the right breast. No evidence of malignancy in the right breast. RECOMMENDATION: Two left breast stereotactic biopsies are recommended to sample the anterior and posterior extent of the suspicious microcalcifications. These biopsies are being scheduled for the patient. I have discussed the findings and recommendations with the patient. Results were also provided in writing at the conclusion of the visit. If applicable, a reminder letter will be sent to the patient regarding the next appointment. BI-RADS CATEGORY  4: Suspicious. Electronically Signed   By: Curlene Dolphin M.D.   On: 08/30/2018 08:46   Mm Diag Breast Tomo Bilateral  Result Date: 08/30/2018 CLINICAL DATA:  48 year old patient presents for palpable area of concern in the axillary tail of the right breast. She has cyclical bilateral breast tenderness. Most recent mammogram June 2018. EXAM: DIGITAL DIAGNOSTIC BILATERAL MAMMOGRAM WITH CAD AND TOMO ULTRASOUND RIGHT BREAST COMPARISON:  Outside mammogram August 29, 2016 ACR Breast Density Category c: The breast tissue is heterogeneously dense, which may obscure small masses. FINDINGS: Metallic skin marker was placed in the region of patient concern in the right axillary tail. Spot compression view of this region shows normal predominately fatty tissue and more superiorly are normal appearing of right axillary lymph nodes. No mass, suspicious microcalcification, or architectural distortion is identified in the right breast. No mass or distortion is identified on the left. Magnification views were performed of calcifications identified posteriorly in the inferior central/slightly outer left breast. There  is a 0.3 cm group of coarse heterogeneous calcifications in the middle third of the breast parenchyma. Directly posterior to this is a larger group of heterogeneous calcifications, spanning 1.2 cm, and there are a few  calcifications positioned in between the two discrete groups. These calcifications are in a ductal distribution and in total span 4 cm anterior to posterior diameter. Mammographic images were processed with CAD. On physical exam, I palpate a smooth, oval mobile mass in approximately 11 o'clock position right breast 11 cm from the nipple. Targeted ultrasound is performed, showing a circumscribed oval fatty mass parallel to the chest wall at approximately 11 o'clock position 11 cm from the nipple. This mass measures 2.9 x 3.1 x 0.7 cm. This palpable mass has imaging features consistent with a benign lipoma. IMPRESSION: Suspicious heterogeneous microcalcifications in a ductal distribution in the inferior central/slightly outer left breast for which ductal carcinoma in situ cannot be excluded. 3.1 cm benign palpable lipoma in the axillary tail of the right breast. No evidence of malignancy in the right breast. RECOMMENDATION: Two left breast stereotactic biopsies are recommended to sample the anterior and posterior extent of the suspicious microcalcifications. These biopsies are being scheduled for the patient. I have discussed the findings and recommendations with the patient. Results were also provided in writing at the conclusion of the visit. If applicable, a reminder letter will be sent to the patient regarding the next appointment. BI-RADS CATEGORY  4: Suspicious. Electronically Signed   By: Curlene Dolphin M.D.   On: 08/30/2018 08:46   Mm Clip Placement Left  Result Date: 09/01/2018 CLINICAL DATA:  Patient is post stereotactic core needle biopsy of 2 groups of microcalcifications over the lower central left breast. EXAM: DIAGNOSTIC left MAMMOGRAM POST stereotactic BIOPSY COMPARISON:  Previous  exam(s). FINDINGS: Mammographic images were obtained following stereotactic guided biopsy of 2 groups of microcalcifications over the lower central left breast. Exam demonstrates satisfactory placement of a coil shaped metallic clip over the more posterior group of microcalcifications and satisfactory placement of an X shaped metallic clip over the more anterior group of microcalcifications. IMPRESSION: Satisfactory clip placement post stereotactic core biopsy of 2 groups of microcalcifications over the lower central left breast. Final Assessment: Post Procedure Mammograms for Marker Placement Electronically Signed   By: Marin Olp M.D.   On: 09/01/2018 09:55   Mm Lt Breast Bx W Loc Dev 1st Lesion Image Bx Spec Stereo Guide  Addendum Date: 09/02/2018   ADDENDUM REPORT: 09/02/2018 13:36 ADDENDUM: Pathology revealed DUCTAL CARCINOMA IN SITU, CALCIFICATIONS of the Left breast, both locations, lower central, anterior and posterior. This was found to be concordant by Dr. Marin Olp. Pathology results were discussed with the patient and her husband by telephone. The patient reported doing well after the biopsies with tenderness at the sites. Post biopsy instructions and care were reviewed and questions were answered. The patient was encouraged to call [facility] for any additional concerns. The patient was referred to [facility] on September 07, 2018 Pathology results reported by Terie Purser, RN on 09/02/2018. Electronically Signed   By: Marin Olp M.D.   On: 09/02/2018 13:36   Result Date: 09/02/2018 CLINICAL DATA:  Patient presents for stereotactic core needle biopsy of 2 groups of microcalcifications over the lower central left breast EXAM: LEFT BREAST STEREOTACTIC CORE NEEDLE BIOPSY COMPARISON:  Previous exams. FINDINGS: The patient and I discussed the procedure of stereotactic-guided biopsy including  benefits and alternatives. We discussed the high likelihood of a successful procedure. We discussed the risks of the procedure including infection, bleeding, tissue injury, clip migration, and inadequate sampling. Informed written consent was given. The usual  time out protocol was performed immediately prior to the procedure. Using sterile technique and 1% Lidocaine as local anesthetic, under stereotactic guidance, a 9 gauge vacuum assisted device was used to perform core needle biopsy of the targeted microcalcifications over the posterior third of the lower central left breast using a lateral to medial approach. Specimen radiograph was performed showing multiple of the targeted microcalcifications. Specimens with calcifications are identified for pathology. Lesion quadrant: Left lower inner quadrant (6-7 o'clock position). At the conclusion of the procedure, a coil shaped tissue marker clip was deployed into the biopsy cavity. Follow-up 2-view mammogram was performed and dictated separately. Using sterile technique and 1% Lidocaine as local anesthetic, under stereotactic guidance, a 9 gauge vacuum assisted device was used to perform core needle biopsy of the targeted microcalcifications over the middle third of the lower central left breast using a lateral to medial approach. Specimen radiograph was performed showing multiple of the targeted microcalcifications. Specimens with calcifications are identified for pathology. Lesion quadrant: Left lower inner quadrant (6-7 o'clock position). At the conclusion of the procedure, a X shaped tissue marker clip was deployed into the biopsy cavity. Follow-up 2-view mammogram was performed and dictated separately. IMPRESSION: Stereotactic-guided biopsy of 2 groups of microcalcifications over the lower central left breast as described. No apparent complications. Electronically Signed: By: Marin Olp M.D. On: 09/01/2018 10:03   Mm Lt Breast Bx W Loc Dev Ea Ad Lesion Img Bx  Spec Stereo Guide  Addendum Date: 09/02/2018   ADDENDUM REPORT: 09/02/2018 13:36 ADDENDUM: Pathology revealed DUCTAL CARCINOMA IN SITU, CALCIFICATIONS of the Left breast, both locations, lower central, anterior and posterior. This was found to be concordant by Dr. Marin Olp. Pathology results were discussed with the patient and her husband by telephone. The patient reported doing well after the biopsies with tenderness at the sites. Post biopsy instructions and care were reviewed and questions were answered. The patient was encouraged to call [facility] for any additional concerns. The patient was referred to [facility] on September 07, 2018 Pathology results reported by Terie Purser, RN on 09/02/2018. Electronically Signed   By: Marin Olp M.D.   On: 09/02/2018 13:36   Result Date: 09/02/2018 CLINICAL DATA:  Patient presents for stereotactic core needle biopsy of 2 groups of microcalcifications over the lower central left breast EXAM: LEFT BREAST STEREOTACTIC CORE NEEDLE BIOPSY COMPARISON:  Previous exams. FINDINGS: The patient and I discussed the procedure of stereotactic-guided biopsy including benefits and alternatives. We discussed the high likelihood of a successful procedure. We discussed the risks of the procedure including infection, bleeding, tissue injury, clip migration, and inadequate sampling. Informed written consent was given. The usual time out protocol was performed immediately prior to the procedure. Using sterile technique and 1% Lidocaine as local anesthetic, under stereotactic guidance, a 9 gauge vacuum assisted device was used to perform core needle biopsy of the targeted microcalcifications over the posterior third of the lower central left breast using a lateral to medial approach. Specimen radiograph was performed showing multiple of the targeted microcalcifications. Specimens with  calcifications are identified for pathology. Lesion quadrant: Left lower inner quadrant (6-7 o'clock position). At the conclusion of the procedure, a coil shaped tissue marker clip was deployed into the biopsy cavity. Follow-up 2-view mammogram was performed and dictated separately. Using sterile technique and 1% Lidocaine as local anesthetic, under stereotactic guidance, a 9 gauge vacuum assisted device was used to  perform core needle biopsy of the targeted microcalcifications over the middle third of the lower central left breast using a lateral to medial approach. Specimen radiograph was performed showing multiple of the targeted microcalcifications. Specimens with calcifications are identified for pathology. Lesion quadrant: Left lower inner quadrant (6-7 o'clock position). At the conclusion of the procedure, a X shaped tissue marker clip was deployed into the biopsy cavity. Follow-up 2-view mammogram was performed and dictated separately. IMPRESSION: Stereotactic-guided biopsy of 2 groups of microcalcifications over the lower central left breast as described. No apparent complications. Electronically Signed: By: Marin Olp M.D. On: 09/01/2018 10:03    ELIGIBLE FOR AVAILABLE RESEARCH PROTOCOL:no  ASSESSMENT: 48 y.o. [place], [place] status post left breast biopsy 09/01/2022 ductal carcinoma in situ, grade 3, measuring clinically 4.1 cm, estrogen and progesterone receptor negative  (1) definitive surgery pending  (2) adjuvant radiation as appropriate  (3) genetics testing drawn 09/07/2018  PLAN: I spent approximately 60 minutes face to face with Shelley Andrews  with more than 50% of that time spent in counseling and coordination of care. Specifically we reviewed the biology of the patient's diagnosis and the specifics of her situation.  Shelley Andrews understands that in noninvasive ductal carcinoma, also called ductal carcinoma in situ ("DCIS") the breast cancer cells remain trapped in the ducts were they  started. They cannot travel to a vital organ. For that reason these cancers in themselves are not life-threatening.  If the whole breast is removed then all the ducts are removed and since the cancer cells are trapped in the ducts, the cure rate with mastectomy for noninvasive breast cancer is approximately 99%. Nevertheless we recommend lumpectomy if cosmetically feasible, because there is no survival advantage to mastectomy and because the cosmetic result is generally superior with breast conservation.  Since the patient is planning on keeping her breasts, there will be some risk of recurrence. The recurrence can only be in the same breast since, again, the cells are trapped in the ducts. There is no connection from one breast to the other. The risk of local recurrence is cut by more than half with radiation, which is standard in this situation.  Most breast cancers are estrogen receptor positive, meaning that they "eat" estrogen and are estrogen dependent.  However that is not the case here and antiestrogens would not be useful in the treatment of this cancer.  They could be used prophylactically to prevent another breast cancer from developing in the future.  Accordingly the overall plan is for surgery, followed by radiation, then a discussion of anti-estrogens.  Shelley Andrews does qualify for genetics testing. In patients who carry a deleterious mutation [for example in a  BRCA gene], the risk of a new breast cancer developing in the future may be sufficiently great that the patient may choose bilateral mastectomies. However if she wishes to keep her breasts in that situation it is safe to do so. That would require intensified screening, which generally means not only yearly mammography but a yearly breast MRI as well.   The plan then is to start with MRI and genetics and follow-up with definitive surgery, which may be curative.  She will see me in a few months to discuss antiestrogen  prophylaxis  Shelley Andrews has a good understanding of the overall plan. She agrees with it. She knows the goal of treatment in her case is cure. She will call with any problems that may develop before her next visit here.   Chauncey Cruel, MD   09/07/2018  11:04 AM Medical Oncology and Hematology [facility] [address] [place], [place] [address] Tel. [phone]    Fax. [phone]   This document serves as a record of services personally performed by Lurline Del, MD. It was created on his behalf by Wilburn Mylar, a trained medical scribe. The creation of this record is based on the scribe's personal observations and the provider's statements to them.   I, Lurline Del MD, have reviewed the above documentation for accuracy and completeness, and I agree with the above.

## 2018-09-07 ENCOUNTER — Other Ambulatory Visit: Payer: Self-pay

## 2018-09-07 ENCOUNTER — Other Ambulatory Visit: Payer: Self-pay | Admitting: *Deleted

## 2018-09-07 ENCOUNTER — Encounter: Payer: Self-pay | Admitting: Radiation Oncology

## 2018-09-07 ENCOUNTER — Encounter: Payer: Self-pay | Admitting: Oncology

## 2018-09-07 ENCOUNTER — Encounter: Payer: Self-pay | Admitting: Licensed Clinical Social Worker

## 2018-09-07 ENCOUNTER — Ambulatory Visit (HOSPITAL_BASED_OUTPATIENT_CLINIC_OR_DEPARTMENT_OTHER): Payer: 59 | Admitting: Licensed Clinical Social Worker

## 2018-09-07 ENCOUNTER — Ambulatory Visit
Admission: RE | Admit: 2018-09-07 | Discharge: 2018-09-07 | Disposition: A | Discharge status: Home / Self Care | Payer: 59 | Source: Ambulatory Visit | Attending: Radiation Oncology | Admitting: Radiation Oncology

## 2018-09-07 ENCOUNTER — Inpatient Hospital Stay: Payer: 59

## 2018-09-07 ENCOUNTER — Ambulatory Visit: Payer: 59 | Admitting: Physical Therapy

## 2018-09-07 ENCOUNTER — Inpatient Hospital Stay: Payer: 59 | Attending: Oncology | Admitting: Oncology

## 2018-09-07 VITALS — BP 108/64 | HR 79 | Temp 98.2°F | Resp 18 | Ht 60.0 in | Wt 122.5 lb

## 2018-09-07 DIAGNOSIS — Z803 Family history of malignant neoplasm of breast: Secondary | ICD-10-CM

## 2018-09-07 DIAGNOSIS — D171 Benign lipomatous neoplasm of skin and subcutaneous tissue of trunk: Secondary | ICD-10-CM | POA: Insufficient documentation

## 2018-09-07 DIAGNOSIS — Z1371 Encounter for nonprocreative screening for genetic disease carrier status: Secondary | ICD-10-CM | POA: Diagnosis not present

## 2018-09-07 DIAGNOSIS — Z809 Family history of malignant neoplasm, unspecified: Secondary | ICD-10-CM | POA: Diagnosis not present

## 2018-09-07 DIAGNOSIS — D0512 Intraductal carcinoma in situ of left breast: Secondary | ICD-10-CM

## 2018-09-07 DIAGNOSIS — Z87891 Personal history of nicotine dependence: Secondary | ICD-10-CM

## 2018-09-07 LAB — CBC WITH DIFFERENTIAL (CANCER CENTER ONLY)
Abs Immature Granulocytes: 0.01 10*3/uL (ref 0.00–0.07)
Basophils Absolute: 0 10*3/uL (ref 0.0–0.1)
Basophils Relative: 1 %
Eosinophils Absolute: 0 10*3/uL (ref 0.0–0.5)
Eosinophils Relative: 1 %
HCT: 41.1 % (ref 36.0–46.0)
Hemoglobin: 13.4 g/dL (ref 12.0–15.0)
Immature Granulocytes: 0 %
Lymphocytes Relative: 33 %
Lymphs Abs: 2 10*3/uL (ref 0.7–4.0)
MCH: 30.2 pg (ref 26.0–34.0)
MCHC: 32.6 g/dL (ref 30.0–36.0)
MCV: 92.8 fL (ref 80.0–100.0)
Monocytes Absolute: 0.3 10*3/uL (ref 0.1–1.0)
Monocytes Relative: 5 %
Neutro Abs: 3.6 10*3/uL (ref 1.7–7.7)
Neutrophils Relative %: 60 %
Platelet Count: 321 10*3/uL (ref 150–400)
RBC: 4.43 MIL/uL (ref 3.87–5.11)
RDW: 12.5 % (ref 11.5–15.5)
WBC Count: 5.9 10*3/uL (ref 4.0–10.5)
nRBC: 0 % (ref 0.0–0.2)

## 2018-09-07 LAB — CMP (CANCER CENTER ONLY)
ALT: 20 U/L (ref 0–44)
AST: 19 U/L (ref 15–41)
Albumin: 4.4 g/dL (ref 3.5–5.0)
Alkaline Phosphatase: 52 U/L (ref 38–126)
Anion gap: 12 (ref 5–15)
BUN: 11 mg/dL (ref 6–20)
CO2: 23 mmol/L (ref 22–32)
Calcium: 9.1 mg/dL (ref 8.9–10.3)
Chloride: 106 mmol/L (ref 98–111)
Creatinine: 0.82 mg/dL (ref 0.44–1.00)
GFR, Est AFR Am: >60 mL/min (ref >60)
GFR, Estimated: >60 mL/min (ref >60)
Glucose, Bld: 96 mg/dL (ref 70–99)
Potassium: 3.6 mmol/L (ref 3.5–5.1)
Sodium: 141 mmol/L (ref 135–145)
Total Bilirubin: 0.6 mg/dL (ref 0.3–1.2)
Total Protein: 7.7 g/dL (ref 6.5–8.1)

## 2018-09-07 NOTE — Progress Notes (Signed)
REFERRING PROVIDER: Chauncey Cruel, MD [address] [place], [place] [address]  PRIMARY PROVIDER:  No primary care provider on file.  PRIMARY REASON FOR VISIT:  1. Ductal carcinoma in situ (DCIS) of left breast   2. Family history of breast cancer    I connected with Ms. Paganelli on 09/07/2018 at 9:30 AM EDT by Jackquline Denmark and verified that I am speaking with the correct person using two identifiers.    Patient location:clinic Provider location: clinic  HISTORY OF PRESENT ILLNESS:   Shelley Andrews, a 48 y.o. female, was seen for a [place] cancer genetics consultation at the request of Dr. No ref. provider found due to a personal and family history of cancer.  Ms. Shults presents to clinic today to discuss the possibility of a hereditary predisposition to cancer, genetic testing, and to further clarify her future cancer risks, as well as potential cancer risks for family members.   In 2020, at the age of 43, Ms. Cavey was diagnosed with ductal carcinoma in situ of the left breast, ER-, PR-. The treatment plan includes surgery, possible radiation and antiestrogens.  RISK FACTORS:  Menarche was at age 17.  First live birth at age 70.  OCP use for approximately 0 years.  Premenopausal. Any excessive radiation exposure in the past: no  Past Medical History:  Diagnosis Date  . Family history of breast cancer     Past Surgical History:  Procedure Laterality Date  . CESAREAN SECTION      Social History   Socioeconomic History  . Marital status: Unknown    Spouse name: Not on file  . Number of children: Not on file  . Years of education: Not on file  . Highest education level: Not on file  Occupational History  . Not on file  Social Needs  . Financial resource strain: Not on file  . Food insecurity:    Worry: Not on file    Inability: Not on file  . Transportation needs:    Medical: Not on file    Non-medical: Not on file  Tobacco Use  . Smoking status: Former  Smoker    Last attempt to quit: 07/08/2018    Years since quitting: 0.1  Substance and Sexual Activity  . Alcohol use: Not Currently  . Drug use: Never  . Sexual activity: Not on file  Lifestyle  . Physical activity:    Days per week: Not on file    Minutes per session: Not on file  . Stress: Not on file  Relationships  . Social connections:    Talks on phone: Not on file    Gets together: Not on file    Attends religious service: Not on file    Active member of club or organization: Not on file    Attends meetings of clubs or organizations: Not on file    Relationship status: Not on file  Other Topics Concern  . Not on file  Social History Narrative  . Not on file     FAMILY HISTORY:  We obtained a detailed, 4-generation family history.  Significant diagnoses are listed below: Family History  Problem Relation Age of Onset  . Lung cancer Father   . Breast cancer Maternal Aunt    Ms. Edrington has one son, age 51. She has 2 brothers and 7 sisters, no history of cancer. No cancers in nieces/nephews.  Ms. Kinner mother died at 71, no history of cancer. The patient had 2 maternal aunts, 1  maternal uncle. One of her aunts had breast cancer, unsure of age of diagnosis. No cancers in cousins she is aware of. Her maternal grandmother died at 23, she is unsure of grandfather's age of death. She has limited knowledge about this side of the family in general.  Ms. Kerkman father died at 63 and possibly had lung cancer, he did have a history of smoking and COPD. The patient had 2 paternal uncles, 6 paternal aunts, no cancers she is aware of. No cancers in paternal cousins. Her paternal grandfather died at 49, unsure of age of death for her paternal grandmother. Again, she has limited knowledge about this side of the family.   Ms. Hofmeister is unaware of previous family history of genetic testing for hereditary cancer risks. Patient is from [place]. There is no reported Ashkenazi Jewish  ancestry. There is no known consanguinity.  GENETIC COUNSELING ASSESSMENT: Ms. Hiraldo is a 48 y.o. female with a personal and family which is somewhat suggestive of a hereditary cancer syndrome and predisposition to cancer. We, therefore, discussed and recommended the following at today's visit.   DISCUSSION: We discussed that 5 - 10% of breast cancer is hereditary, with most cases associated with the BRCA1/BRCA2 genes.  There are other genes that can be associated with hereditary breast cancer syndromes.We reviewed the characteristics, features and inheritance patterns of hereditary cancer syndromes. We also discussed genetic testing, including the appropriate family members to test, the process of testing, insurance coverage and turn-around-time for results. We discussed the implications of a negative, positive and/or variant of uncertain significant result. We recommended Ms. Eckstrom pursue genetic testing for the [facility] STAT Breast Cancer Panel + Common Hereditary Cancers gene panel.   The STAT Breast cancer panel offered by [facility] includes sequencing and rearrangement analysis for the following 9 genes:  ATM, BRCA1, BRCA2, CDH1, CHEK2, PALB2, PTEN, STK11 and TP53.    The Common Hereditary Cancers Panel offered by [facility] includes sequencing and/or deletion duplication testing of the following 48 genes: APC, ATM, AXIN2, BARD1, BMPR1A, BRCA1, BRCA2, BRIP1, CDH1, CDKN2A (p14ARF), CDKN2A (p16INK4a), CKD4, CHEK2, CTNNA1, DICER1, EPCAM (Deletion/duplication testing only), GREM1 (promoter region deletion/duplication testing only), KIT, MEN1, MLH1, MSH2, MSH3, MSH6, MUTYH, NBN, NF1, NHTL1, PALB2, PDGFRA, PMS2, POLD1, POLE, PTEN, RAD50, RAD51C, RAD51D, RNF43, SDHB, SDHC, SDHD, SMAD4, SMARCA4. STK11, TP53, TSC1, TSC2, and VHL.  The following genes were evaluated for sequence changes only: SDHA and HOXB13 c.251G>A variant only.  Based on Ms. Villela's personal and family history of cancer, she meets medical  criteria for genetic testing. Despite that she meets criteria, she may still have an out of pocket cost.   PLAN: After considering the risks, benefits, and limitations, Ms. Quant provided informed consent to pursue genetic testing and the blood sample was sent to [facility] for analysis of the Common Hereditary Cancers Panel. Initial results should be available within approximately 1 weeks' time, at which point they will be disclosed by telephone to Ms. Brownlee, as will any additional recommendations warranted by these results. Ms. Uriarte will receive a summary of her genetic counseling visit and a copy of her results once available. This information will also be available in Epic.   Lastly, we encouraged Ms. Mutz to remain in contact with cancer genetics annually so that we can continuously update the family history and inform her of any changes in cancer genetics and testing that may be of benefit for this family.   Ms. Rossano questions were answered to her satisfaction today. Our  contact information was provided should additional questions or concerns arise. Thank you for the referral and allowing [place] to share in the care of your patient.   Faith Rogue, MS Genetic Counselor [email or web address] Phone: [phone]  The patient was seen for a total of 20 minutes in virtual genetic counseling.  Drs. Magrinat, Lindi Adie and/or Burr Medico were available for discussion regarding this case.

## 2018-09-07 NOTE — Progress Notes (Addendum)
Radiation Oncology         [phone]) [phone] ________________________________  Initial outpatient Consultation-patient seen in person  Name: Shelley Andrews MRN: [id number]  Date: 09/07/2018  DOB: [birth year]  LM:[id number], Bill Salinas, MD  Stark Klein, MD   REFERRING PHYSICIAN: Stark Klein, MD  DIAGNOSIS:    ICD-10-CM   1. Ductal carcinoma in situ (DCIS) of left breast D05.12    Cancer Staging Ductal carcinoma in situ (DCIS) of left breast Staging form: Breast, AJCC 8th Edition - Clinical stage from 09/07/2018: Stage 0 (cTis (DCIS), cN0, cM0, ER-, PR-, HER2: Not Assessed) - Unsigned   CHIEF COMPLAINT: Here to discuss management of LEFT breast DCIS  HISTORY OF PRESENT ILLNESS::Shelley Andrews is a 48 y.o. female who presented with a palpable mass in the right axillary tail.  Further work-up determined this to be a lipoma.  However left breast mammogram revealed suspicious calcifications in the central/outer left breast.  2 biopsies in this area were performed.  The pathology revealed high-grade DCIS, ER PR negative.    She reports that she is otherwise in her usual state of health.  PREVIOUS RADIATION THERAPY: No  PAST MEDICAL HISTORY:  has a past medical history of Breast cancer ([place]) and Family history of breast cancer.    PAST SURGICAL HISTORY: Past Surgical History:  Procedure Laterality Date   CESAREAN SECTION      FAMILY HISTORY: family history includes Breast cancer in her maternal aunt; Lung cancer in her father; Stroke in her mother.  SOCIAL HISTORY:  reports that she quit smoking about 2 months ago. She smoked 1.00 pack per day. She has never used smokeless tobacco. She reports previous alcohol use. She reports that she does not use drugs.  ALLERGIES: Patient has no known allergies.  MEDICATIONS:  No current outpatient medications on file.   No current facility-administered medications for this encounter.     REVIEW OF SYSTEMS: A 10+ POINT REVIEW OF  SYSTEMS WAS OBTAINED including neurology, dermatology, psychiatry, cardiac, respiratory, lymph, extremities, GI, GU, Musculoskeletal, constitutional, breasts, reproductive, HEENT.  All pertinent positives are noted in the HPI.  All others are negative.   PHYSICAL EXAM:  Vitals with Age-Percentiles 09/07/2018  Length 920.1 cm  Systolic 007  Diastolic 64  Pulse 79  Respiration 18  Weight 55.566 kg  BMI 23.92  VISIT REPORT    General: Alert and oriented, in no acute distress Skin notable for bruising in lower outer quadrant of left breast Extremities without any edema Psychiatric: Judgment and insight are intact. Affect is appropriate. Breasts: There is swelling and firmness in the lower outer quadrant of the left breast consistent with biopsy changes.  There is a soft tissue mass consistent with a lipoma that is approximately 4cm in dimension in the right upper outer quadrant /axilla  ECOG = 0  0 - Asymptomatic (Fully active, able to carry on all predisease activities without restriction)  1 - Symptomatic but completely ambulatory (Restricted in physically strenuous activity but ambulatory and able to carry out work of a light or sedentary nature. For example, light housework, office work)  2 - Symptomatic, <50% in bed during the day (Ambulatory and capable of all self care but unable to carry out any work activities. Up and about more than 50% of waking hours)  3 - Symptomatic, >50% in bed, but not bedbound (Capable of only limited self-care, confined to bed or chair 50% or more of waking hours)  4 - Bedbound (Completely disabled. Cannot carry on any  self-care. Totally confined to bed or chair)  5 - Death   Eustace Pen MM, Creech RH, Tormey DC, et al. [phone]). "Toxicity and response criteria of the [facility]". [place] Oncol. 5 (6): 649-55   LABORATORY DATA:  Lab Results  Component Value Date   WBC 5.9 09/07/2018   HGB 13.4 09/07/2018   HCT 41.1 09/07/2018    MCV 92.8 09/07/2018   PLT 321 09/07/2018   CMP     Component Value Date/Time   NA 141 09/07/2018 0804   K 3.6 09/07/2018 0804   CL 106 09/07/2018 0804   CO2 23 09/07/2018 0804   GLUCOSE 96 09/07/2018 0804   BUN 11 09/07/2018 0804   CREATININE 0.82 09/07/2018 0804   CALCIUM 9.1 09/07/2018 0804   PROT 7.7 09/07/2018 0804   ALBUMIN 4.4 09/07/2018 0804   AST 19 09/07/2018 0804   ALT 20 09/07/2018 0804   ALKPHOS 52 09/07/2018 0804   BILITOT 0.6 09/07/2018 0804   GFRNONAA >60 09/07/2018 0804   GFRAA >60 09/07/2018 0804         RADIOGRAPHY: US Breast Ltd Uni Right Inc Axilla  Result Date: 08/30/2018 CLINICAL DATA:  48 year old patient presents for palpable area of concern in the axillary tail of the right breast. She has cyclical bilateral breast tenderness. Most recent mammogram June 2018. EXAM: DIGITAL DIAGNOSTIC BILATERAL MAMMOGRAM WITH CAD AND TOMO ULTRASOUND RIGHT BREAST COMPARISON:  Outside mammogram August 29, 2016 ACR Breast Density Category c: The breast tissue is heterogeneously dense, which may obscure small masses. FINDINGS: Metallic skin marker was placed in the region of patient concern in the right axillary tail. Spot compression view of this region shows normal predominately fatty tissue and more superiorly are normal appearing of right axillary lymph nodes. No mass, suspicious microcalcification, or architectural distortion is identified in the right breast. No mass or distortion is identified on the left. Magnification views were performed of calcifications identified posteriorly in the inferior central/slightly outer left breast. There is a 0.3 cm group of coarse heterogeneous calcifications in the middle third of the breast parenchyma. Directly posterior to this is a larger group of heterogeneous calcifications, spanning 1.2 cm, and there are a few calcifications positioned in between the two discrete groups. These calcifications are in a ductal distribution and in total span  4 cm anterior to posterior diameter. Mammographic images were processed with CAD. On physical exam, I palpate a smooth, oval mobile mass in approximately 11 o'clock position right breast 11 cm from the nipple. Targeted ultrasound is performed, showing a circumscribed oval fatty mass parallel to the chest wall at approximately 11 o'clock position 11 cm from the nipple. This mass measures 2.9 x 3.1 x 0.7 cm. This palpable mass has imaging features consistent with a benign lipoma. IMPRESSION: Suspicious heterogeneous microcalcifications in a ductal distribution in the inferior central/slightly outer left breast for which ductal carcinoma in situ cannot be excluded. 3.1 cm benign palpable lipoma in the axillary tail of the right breast. No evidence of malignancy in the right breast. RECOMMENDATION: Two left breast stereotactic biopsies are recommended to sample the anterior and posterior extent of the suspicious microcalcifications. These biopsies are being scheduled for the patient. I have discussed the findings and recommendations with the patient. Results were also provided in writing at the conclusion of the visit. If applicable, a reminder letter will be sent to the patient regarding the next appointment. BI-RADS CATEGORY  4: Suspicious. Electronically Signed   By: Manuela Schwartz  Turner M.D.   On: 08/30/2018 08:46   Mm Diag Breast Tomo Bilateral  Result Date: 08/30/2018 CLINICAL DATA:  48 year old patient presents for palpable area of concern in the axillary tail of the right breast. She has cyclical bilateral breast tenderness. Most recent mammogram June 2018. EXAM: DIGITAL DIAGNOSTIC BILATERAL MAMMOGRAM WITH CAD AND TOMO ULTRASOUND RIGHT BREAST COMPARISON:  Outside mammogram August 29, 2016 ACR Breast Density Category c: The breast tissue is heterogeneously dense, which may obscure small masses. FINDINGS: Metallic skin marker was placed in the region of patient concern in the right axillary tail. Spot compression view of  this region shows normal predominately fatty tissue and more superiorly are normal appearing of right axillary lymph nodes. No mass, suspicious microcalcification, or architectural distortion is identified in the right breast. No mass or distortion is identified on the left. Magnification views were performed of calcifications identified posteriorly in the inferior central/slightly outer left breast. There is a 0.3 cm group of coarse heterogeneous calcifications in the middle third of the breast parenchyma. Directly posterior to this is a larger group of heterogeneous calcifications, spanning 1.2 cm, and there are a few calcifications positioned in between the two discrete groups. These calcifications are in a ductal distribution and in total span 4 cm anterior to posterior diameter. Mammographic images were processed with CAD. On physical exam, I palpate a smooth, oval mobile mass in approximately 11 o'clock position right breast 11 cm from the nipple. Targeted ultrasound is performed, showing a circumscribed oval fatty mass parallel to the chest wall at approximately 11 o'clock position 11 cm from the nipple. This mass measures 2.9 x 3.1 x 0.7 cm. This palpable mass has imaging features consistent with a benign lipoma. IMPRESSION: Suspicious heterogeneous microcalcifications in a ductal distribution in the inferior central/slightly outer left breast for which ductal carcinoma in situ cannot be excluded. 3.1 cm benign palpable lipoma in the axillary tail of the right breast. No evidence of malignancy in the right breast. RECOMMENDATION: Two left breast stereotactic biopsies are recommended to sample the anterior and posterior extent of the suspicious microcalcifications. These biopsies are being scheduled for the patient. I have discussed the findings and recommendations with the patient. Results were also provided in writing at the conclusion of the visit. If applicable, a reminder letter will be sent to the  patient regarding the next appointment. BI-RADS CATEGORY  4: Suspicious. Electronically Signed   By: Curlene Dolphin M.D.   On: 08/30/2018 08:46   Mm Clip Placement Left  Result Date: 09/01/2018 CLINICAL DATA:  Patient is post stereotactic core needle biopsy of 2 groups of microcalcifications over the lower central left breast. EXAM: DIAGNOSTIC left MAMMOGRAM POST stereotactic BIOPSY COMPARISON:  Previous exam(s). FINDINGS: Mammographic images were obtained following stereotactic guided biopsy of 2 groups of microcalcifications over the lower central left breast. Exam demonstrates satisfactory placement of a coil shaped metallic clip over the more posterior group of microcalcifications and satisfactory placement of an X shaped metallic clip over the more anterior group of microcalcifications. IMPRESSION: Satisfactory clip placement post stereotactic core biopsy of 2 groups of microcalcifications over the lower central left breast. Final Assessment: Post Procedure Mammograms for Marker Placement Electronically Signed   By: Marin Olp M.D.   On: 09/01/2018 09:55   Mm Lt Breast Bx W Loc Dev 1st Lesion Image Bx Spec Stereo Guide  Addendum Date: 09/02/2018   ADDENDUM REPORT: 09/02/2018 13:36 ADDENDUM: Pathology revealed DUCTAL CARCINOMA IN SITU, CALCIFICATIONS of the Left breast, both locations,  lower central, anterior and posterior. This was found to be concordant by Dr. Marin Olp. Pathology results were discussed with the patient and her husband by telephone. The patient reported doing well after the biopsies with tenderness at the sites. Post biopsy instructions and care were reviewed and questions were answered. The patient was encouraged to call [facility] for any additional concerns. The patient was referred to [facility] on September 07, 2018 Pathology results reported by Terie Purser, RN on 09/02/2018.  Electronically Signed   By: Marin Olp M.D.   On: 09/02/2018 13:36   Result Date: 09/02/2018 CLINICAL DATA:  Patient presents for stereotactic core needle biopsy of 2 groups of microcalcifications over the lower central left breast EXAM: LEFT BREAST STEREOTACTIC CORE NEEDLE BIOPSY COMPARISON:  Previous exams. FINDINGS: The patient and I discussed the procedure of stereotactic-guided biopsy including benefits and alternatives. We discussed the high likelihood of a successful procedure. We discussed the risks of the procedure including infection, bleeding, tissue injury, clip migration, and inadequate sampling. Informed written consent was given. The usual time out protocol was performed immediately prior to the procedure. Using sterile technique and 1% Lidocaine as local anesthetic, under stereotactic guidance, a 9 gauge vacuum assisted device was used to perform core needle biopsy of the targeted microcalcifications over the posterior third of the lower central left breast using a lateral to medial approach. Specimen radiograph was performed showing multiple of the targeted microcalcifications. Specimens with calcifications are identified for pathology. Lesion quadrant: Left lower inner quadrant (6-7 o'clock position). At the conclusion of the procedure, a coil shaped tissue marker clip was deployed into the biopsy cavity. Follow-up 2-view mammogram was performed and dictated separately. Using sterile technique and 1% Lidocaine as local anesthetic, under stereotactic guidance, a 9 gauge vacuum assisted device was used to perform core needle biopsy of the targeted microcalcifications over the middle third of the lower central left breast using a lateral to medial approach. Specimen radiograph was performed showing multiple of the targeted microcalcifications. Specimens with calcifications are identified for pathology. Lesion quadrant: Left lower inner quadrant (6-7 o'clock position). At the conclusion of the  procedure, a X shaped tissue marker clip was deployed into the biopsy cavity. Follow-up 2-view mammogram was performed and dictated separately. IMPRESSION: Stereotactic-guided biopsy of 2 groups of microcalcifications over the lower central left breast as described. No apparent complications. Electronically Signed: By: Marin Olp M.D. On: 09/01/2018 10:03   Mm Lt Breast Bx W Loc Dev Ea Ad Lesion Img Bx Spec Stereo Guide  Addendum Date: 09/02/2018   ADDENDUM REPORT: 09/02/2018 13:36 ADDENDUM: Pathology revealed DUCTAL CARCINOMA IN SITU, CALCIFICATIONS of the Left breast, both locations, lower central, anterior and posterior. This was found to be concordant by Dr. Marin Olp. Pathology results were discussed with the patient and her husband by telephone. The patient reported doing well after the biopsies with tenderness at the sites. Post biopsy instructions and care were reviewed and questions were answered. The patient was encouraged to call [facility] for any additional concerns. The patient was referred to [facility] at [facility] on September 07, 2018 Pathology results reported by Terie Purser, RN on 09/02/2018. Electronically Signed   By: Marin Olp M.D.   On: 09/02/2018 13:36   Result Date: 09/02/2018 CLINICAL DATA:  Patient presents for stereotactic core needle biopsy of 2  groups of microcalcifications over the lower central left breast EXAM: LEFT BREAST STEREOTACTIC CORE NEEDLE BIOPSY COMPARISON:  Previous exams. FINDINGS: The patient and I discussed the procedure of stereotactic-guided biopsy including benefits and alternatives. We discussed the high likelihood of a successful procedure. We discussed the risks of the procedure including infection, bleeding, tissue injury, clip migration, and inadequate sampling. Informed written consent was given. The usual time out protocol was performed immediately prior to the  procedure. Using sterile technique and 1% Lidocaine as local anesthetic, under stereotactic guidance, a 9 gauge vacuum assisted device was used to perform core needle biopsy of the targeted microcalcifications over the posterior third of the lower central left breast using a lateral to medial approach. Specimen radiograph was performed showing multiple of the targeted microcalcifications. Specimens with calcifications are identified for pathology. Lesion quadrant: Left lower inner quadrant (6-7 o'clock position). At the conclusion of the procedure, a coil shaped tissue marker clip was deployed into the biopsy cavity. Follow-up 2-view mammogram was performed and dictated separately. Using sterile technique and 1% Lidocaine as local anesthetic, under stereotactic guidance, a 9 gauge vacuum assisted device was used to perform core needle biopsy of the targeted microcalcifications over the middle third of the lower central left breast using a lateral to medial approach. Specimen radiograph was performed showing multiple of the targeted microcalcifications. Specimens with calcifications are identified for pathology. Lesion quadrant: Left lower inner quadrant (6-7 o'clock position). At the conclusion of the procedure, a X shaped tissue marker clip was deployed into the biopsy cavity. Follow-up 2-view mammogram was performed and dictated separately. IMPRESSION: Stereotactic-guided biopsy of 2 groups of microcalcifications over the lower central left breast as described. No apparent complications. Electronically Signed: By: Marin Olp M.D. On: 09/01/2018 10:03      IMPRESSION/PLAN: The patient has been discussed at multidisciplinary tumor board and I saw her in the multidisciplinary breast clinic.  Recommendations have been made for breast MRI.  Depending on these results she may be a candidate for lumpectomy.  If the lumpectomy is performed I do recommend adjuvant radiotherapy.  It was a pleasure meeting the  patient today. We discussed the risks, benefits, and side effects of radiotherapy.  We discussed that radiation would take approximately 4 weeks to complete and that I would give the patient a few weeks to heal following surgery before starting treatment planning. We spoke about acute effects including skin irritation and fatigue as well as much less common late effects including internal organ injury or irritation. We spoke about the latest technology that is used to minimize the risk of late effects for patients undergoing radiotherapy to the breast or chest wall. No guarantees of treatment were given. The patient is enthusiastic about proceeding with treatment. I look forward to participating in the patient's care if warranted.  I will await her referral back to me for postoperative follow-up and eventual CT simulation/treatment planning.   __________________________________________   Eppie Gibson, MD

## 2018-09-08 ENCOUNTER — Telehealth: Payer: Self-pay | Admitting: Oncology

## 2018-09-08 NOTE — Telephone Encounter (Signed)
I talk with patient regarding schedule  

## 2018-09-09 ENCOUNTER — Telehealth: Payer: Self-pay

## 2018-09-09 NOTE — Telephone Encounter (Signed)
Nutrition  RD working remotely.  Patient with new diagnosis of breast cancer DCIS.  Chart reviewed.  Patient attended [facility] on 6/10.    Spoke with patient via phone and phone handed off to husband as patient reports she is working and can't talk right now.  Introduce myself to husband and service at [facility].  Patient was given nutrition packet on 6/10.  Contact information provided.   Husband appreciative of call.  Requested note placed in chart to indicate calls need to be made to patient before 12:30 if possible as she works from 1-9pm and unable to except calls at that time.  RD will send message to nurse navigators regarding request.  Shawnette Augello B. Zenia Resides, [place], [place] Registered Dietitian [phone] (pager)

## 2018-09-13 ENCOUNTER — Ambulatory Visit: Payer: Self-pay | Admitting: Licensed Clinical Social Worker

## 2018-09-13 ENCOUNTER — Telehealth: Payer: Self-pay | Admitting: Licensed Clinical Social Worker

## 2018-09-13 ENCOUNTER — Encounter: Payer: Self-pay | Admitting: General Practice

## 2018-09-13 ENCOUNTER — Encounter: Payer: Self-pay | Admitting: Licensed Clinical Social Worker

## 2018-09-13 DIAGNOSIS — Z1379 Encounter for other screening for genetic and chromosomal anomalies: Secondary | ICD-10-CM | POA: Insufficient documentation

## 2018-09-13 DIAGNOSIS — D0512 Intraductal carcinoma in situ of left breast: Secondary | ICD-10-CM

## 2018-09-13 DIAGNOSIS — Z803 Family history of malignant neoplasm of breast: Secondary | ICD-10-CM

## 2018-09-13 NOTE — Telephone Encounter (Signed)
Revealed negative genetic testing. This normal result is reassuring and indicates that it is unlikely Shelley Andrews's cancer is due to a hereditary cause.  It is unlikely that there is an increased risk of another cancer due to a mutation in one of these genes.  However, genetic testing is not perfect, and cannot definitively rule out a hereditary cause.  It will be important for her to keep in contact with genetics to learn if any additional testing may be needed in the future.

## 2018-09-13 NOTE — Progress Notes (Signed)
Miranda presented to [facility] to introduce [place] team/resources, completing distress screen per protocol.  The patient scored a [unspecified] on the Psychosocial Distress Thermometer which indicates [unspecified] distress.   ONCBCN DISTRESS SCREENING 09/13/2018  Screening Type Initial Screening  Practical problem type Housing;Insurance;Work/school  Family Problem type Partner;Children  Emotional problem type Adjusting to appearance changes  Information Concerns Type Lack of info about diagnosis;Lack of info about treatment;Lack of info about complementary therapy choices;Lack of info about maintaining fitness  Physical Problem type Pain  Referral to support programs Yes    Follow up needed: Yes.  Due to pt's preference for calls before 12:30pm, I plan to make a morning f/u call to assess for logistical and psychosocial needs, as well as to share information about [place] team and resources.   [place], [place], [facility] Pager [phone] Voicemail [phone]

## 2018-09-13 NOTE — Progress Notes (Signed)
HPI:  [name] was previously seen in the [facility] clinic due to a personal and family history of cancer and concerns regarding a hereditary predisposition to cancer. Please refer to our prior cancer genetics clinic note for more information regarding our discussion, assessment and recommendations, at the time. [name] recent genetic test results were disclosed to her, as were recommendations warranted by these results. These results and recommendations are discussed in more detail below.  CANCER HISTORY:  Oncology History   No history exists.    FAMILY HISTORY:  We obtained a detailed, 4-generation family history.  Significant diagnoses are listed below: Family History  Problem Relation Age of Onset  . Stroke Mother   . Lung cancer Father        unsure, possibly TB and not lung ca  . Breast cancer Maternal Aunt    [name] has one son, age 32. She has 2 brothers and 7 sisters, no history of cancer. No cancers in nieces/nephews.  [name] mother died at 53, no history of cancer. The patient had 2 maternal aunts, 1 maternal uncle. One of her aunts had breast cancer, unsure of age of diagnosis. No cancers in cousins she is aware of. Her maternal grandmother died at 63, she is unsure of grandfather's age of death. She has limited knowledge about this side of the family in general.  [name] father died at 32 and possibly had lung cancer, he did have a history of smoking and COPD. The patient had 2 paternal uncles, 6 paternal aunts, no cancers she is aware of. No cancers in paternal cousins. Her paternal grandfather died at 70, unsure of age of death for her paternal grandmother. Again, she has limited knowledge about this side of the family.   [name] is unaware of previous family history of genetic testing for hereditary cancer risks. Patient is from [place]. There is no reported Ashkenazi Jewish ancestry. There is no known consanguinity.  GENETIC  TEST RESULTS: Genetic testing reported out on 09/13/2018 through the [facility] STAT + Common Hereditary  cancer panel found no pathogenic mutations.   The STAT Breast cancer panel offered by [facility] includes sequencing and rearrangement analysis for the following 9 genes:  ATM, BRCA1, BRCA2, CDH1, CHEK2, PALB2, PTEN, STK11 and TP53.    The Common Hereditary Cancers Panel offered by [facility] includes sequencing and/or deletion duplication testing of the following 47 genes: APC, ATM, AXIN2, BARD1, BMPR1A, BRCA1, BRCA2, BRIP1, CDH1, CDKN2A (p14ARF), CDKN2A (p16INK4a), CKD4, CHEK2, CTNNA1, DICER1, EPCAM (Deletion/duplication testing only), GREM1 (promoter region deletion/duplication testing only), KIT, MEN1, MLH1, MSH2, MSH3, MSH6, MUTYH, NBN, NF1, NHTL1, PALB2, PDGFRA, PMS2, POLD1, POLE, PTEN, RAD50, RAD51C, RAD51D,  SDHB, SDHC, SDHD, SMAD4, SMARCA4. STK11, TP53, TSC1, TSC2, and VHL.  The following genes were evaluated for sequence changes only: SDHA and HOXB13 c.251G>A variant only.  The test report has been scanned into EPIC and is located under the Molecular Pathology section of the Results Review tab.  A portion of the result report is included below for reference.     We discussed with [name] that because current genetic testing is not perfect, it is possible there may be a gene mutation in one of these genes that current testing cannot detect, but that chance is small.  We also discussed, that there could be another gene that has not yet been discovered, or that we have not yet tested, that is responsible for the cancer diagnoses in the family. It is  also possible there is a hereditary cause for the cancer in the family that [name] did not inherit and therefore was not identified in her testing.  Therefore, it is important to remain in touch with cancer genetics in the future so that we can continue to offer [name] the most up to date genetic testing.   ADDITIONAL GENETIC TESTING:We  discussed with [name] that her genetic testing was fairly extensive.  If there are genes identified to increase cancer risk that can be analyzed in the future, we would be happy to discuss and coordinate this testing at that time.    CANCER SCREENING RECOMMENDATIONS: [name] test result is considered negative (normal).  This means that we have not identified a hereditary cause for her  personal and family history of cancer at this time. Most cancers happen by chance and this negative test suggests that her cancer may fall into this category.    While reassuring, this does not definitively rule out a hereditary predisposition to cancer. It is still possible that there could be genetic mutations that are undetectable by current technology. There could be genetic mutations in genes that have not been tested or identified to increase cancer risk.  Therefore, it is recommended she continue to follow the cancer management and screening guidelines provided by her oncology and primary healthcare provider.   An individual's cancer risk and medical management are not determined by genetic test results alone. Overall cancer risk assessment incorporates additional factors, including personal medical history, family history, and any available genetic information that may result in a personalized plan for cancer prevention and surveillance  RECOMMENDATIONS FOR FAMILY MEMBERS:  Relatives in this family might be at some increased risk of developing cancer, over the general population risk, simply due to the family history of cancer.  We recommended female relatives in this family have a yearly mammogram beginning at age 6, or 17 years younger than the earliest onset of cancer, an annual clinical breast exam, and perform monthly breast self-exams. Female relatives in this family should also have a gynecological exam as recommended by their primary provider. All family members should have a colonoscopy by age 72,  or as directed by their physicians.  FOLLOW-UP: Lastly, we discussed with [name] that cancer genetics is a rapidly advancing field and it is possible that new genetic tests will be appropriate for her and/or her family members in the future. We encouraged her to remain in contact with cancer genetics on an annual basis so we can update her personal and family histories and let her know of advances in cancer genetics that may benefit this family.   Our contact number was provided. [name] questions were answered to her satisfaction, and she knows she is welcome to call us at anytime with additional questions or concerns.   Faith Rogue, MS Genetic Counselor [email or web address] Phone: [phone]

## 2018-09-14 ENCOUNTER — Encounter: Payer: Self-pay | Admitting: General Practice

## 2018-09-14 ENCOUNTER — Other Ambulatory Visit: Payer: Self-pay

## 2018-09-14 ENCOUNTER — Ambulatory Visit (HOSPITAL_COMMUNITY)
Admission: RE | Admit: 2018-09-14 | Discharge: 2018-09-14 | Disposition: A | Discharge status: Home / Self Care | Payer: 59 | Source: Ambulatory Visit | Attending: General Surgery | Admitting: General Surgery

## 2018-09-14 DIAGNOSIS — D0512 Intraductal carcinoma in situ of left breast: Secondary | ICD-10-CM | POA: Diagnosis present

## 2018-09-14 IMAGING — MR MR BILATERAL BREAST WITHOUT AND WITH CONTRAST
6 of 15 series · 18 of 48 positions shown · IV contrast (Yes)
Comparison: Previous exam(s).

CLINICAL DATA: 48-year-old female with recently diagnosed left
breast ductal carcinoma in situ post stereotactic guided biopsy of 2
sites of calcifications in the lower central left breast. The biopsy
marking clips are located approximately 3.9 cm apart.

LABS:  Not applicable
EXAM:
BILATERAL BREAST MRI WITH AND WITHOUT CONTRAST
TECHNIQUE: Multiplanar, multisequence MR images of both breasts were obtained
prior to and following the intravenous administration of 5 ml of
Gadavist

[Series 1: (phone_number) · axial · 7.0mm · 1.56mm/px · 1 of 25 slices shown]
[im 1/25]
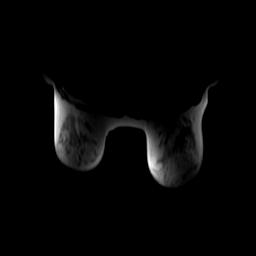

[Series 3: T1 · axial · non-contrast · 2.0mm · 0.70mm/px · z∈[-148,+99]mm · 5 of 248 slices shown]
[im 1/248]
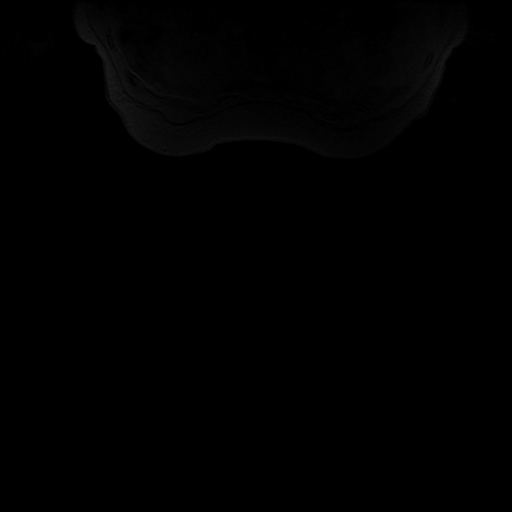
[im 62/248]
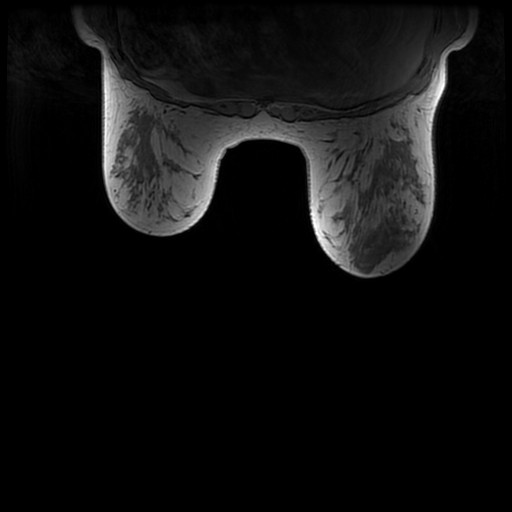
[im 124/248]
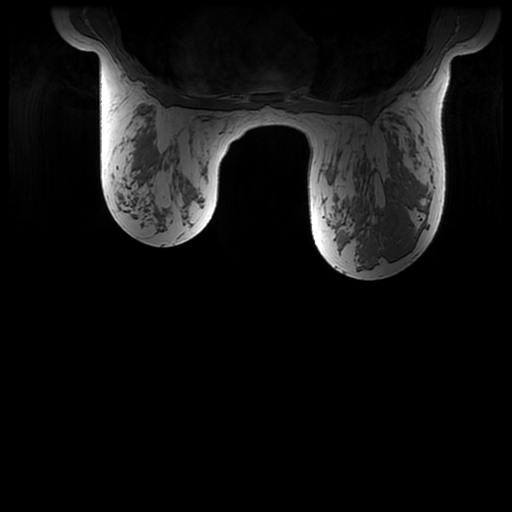
[im 186/248]
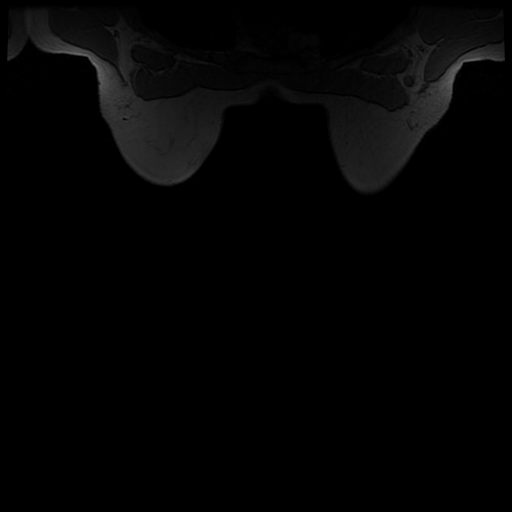
[im 248/248]
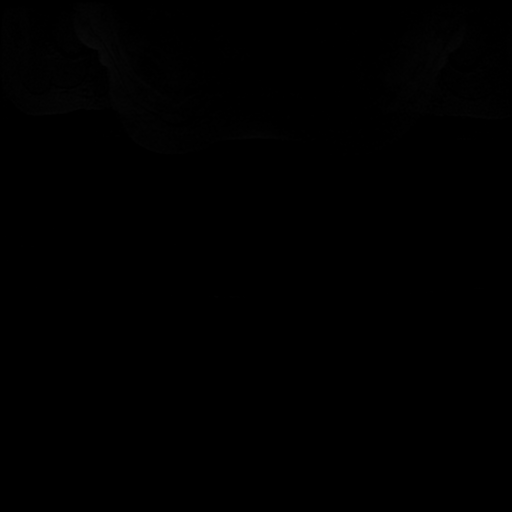

[Series 4: T2 · axial · 3.0mm · 0.70mm/px · 1 of 83 slices shown]
[im 1/83]
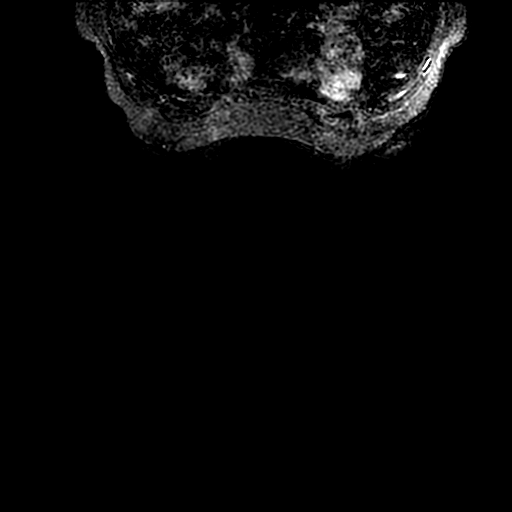

[Series 600: T1 fat-sat · axial · 2.0mm · 0.70mm/px · z∈[-148,+99]mm · 5 of 248 slices shown (1 of 3)]
[im 1/248]
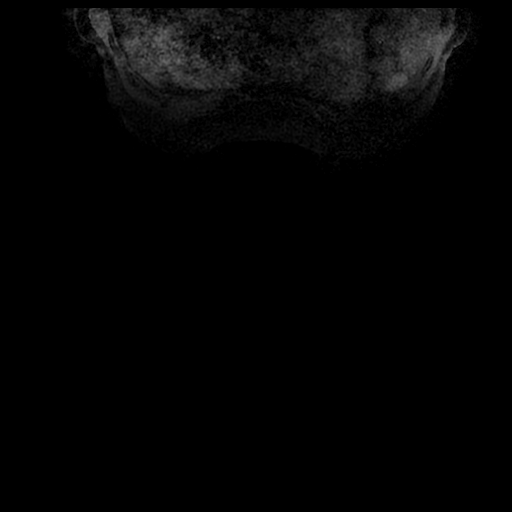
[im 62/248]
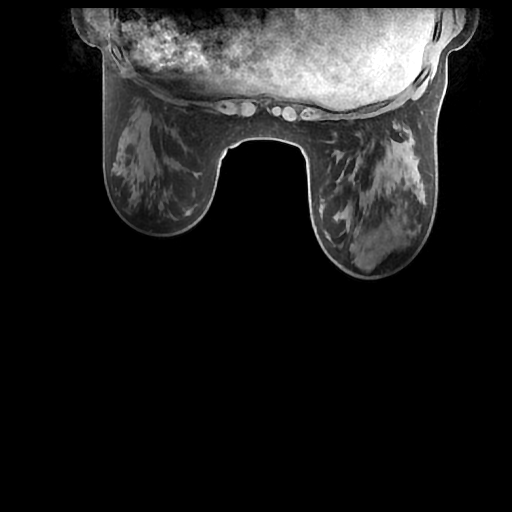
[im 124/248]
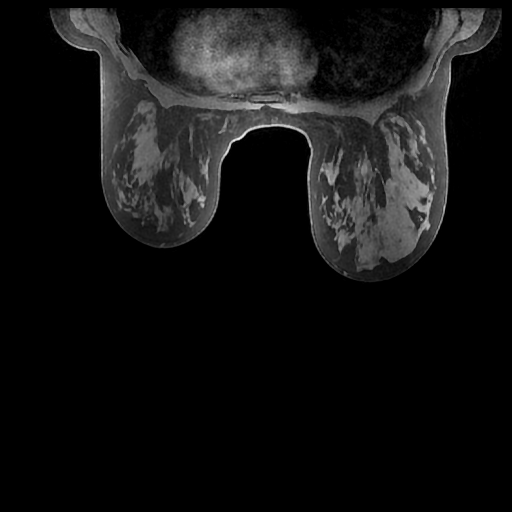
[im 186/248]
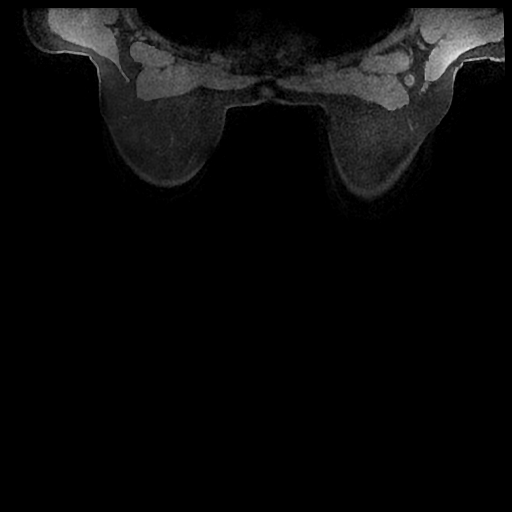
[im 248/248]
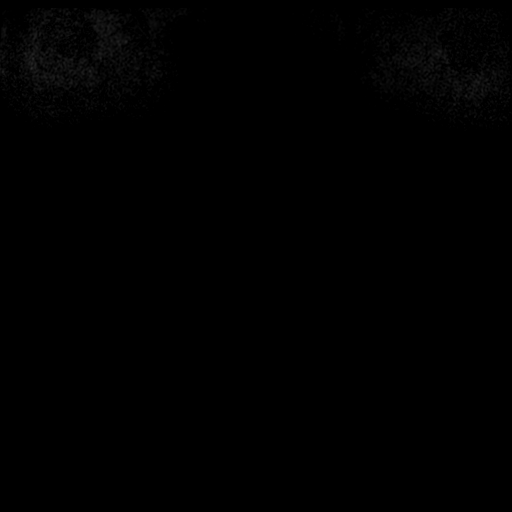

[Series 601: T1 fat-sat · axial · 2.0mm · 0.70mm/px · z∈[-148,+99]mm · 5 of 248 slices shown (2 of 3)]
[im 1/248]
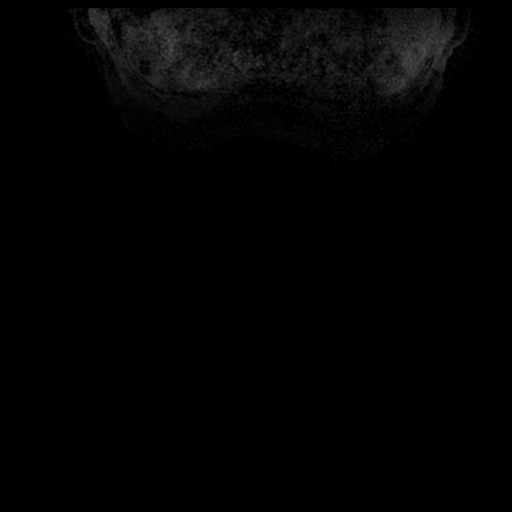
[im 62/248]
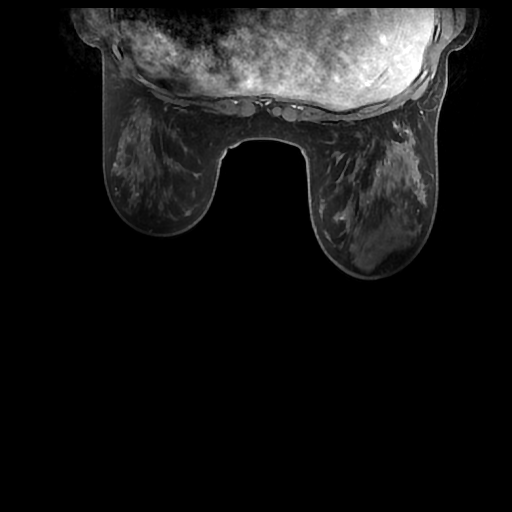
[im 124/248]
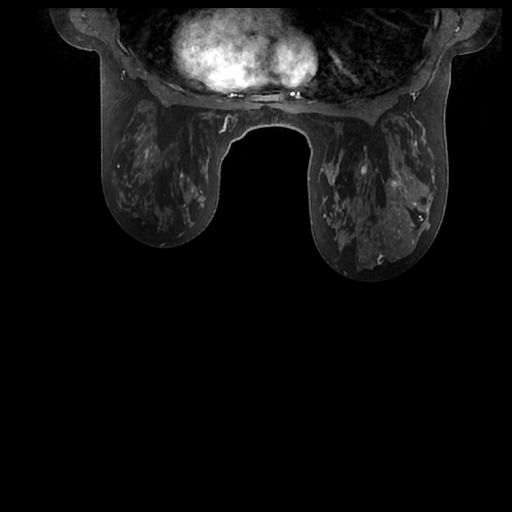
[im 186/248]
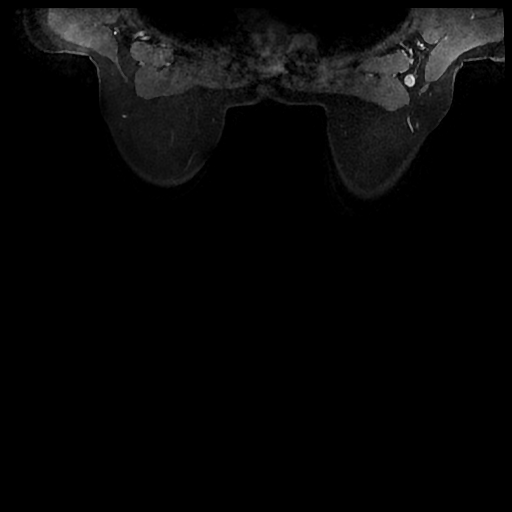
[im 248/248]
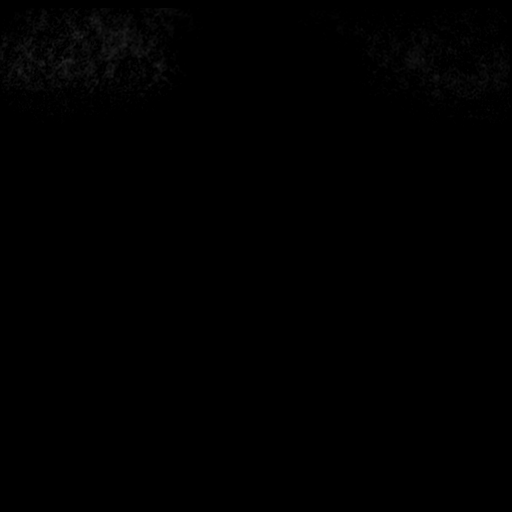

[Series 602: T1 fat-sat · axial · 2.0mm · 0.70mm/px · 1 of 248 slices shown (3 of 3)]
[im 1/248]
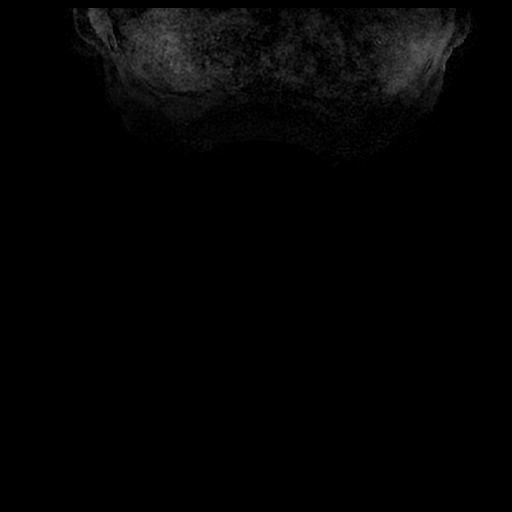

[18 of 48 positions shown; findings below may reference images not displayed]

Three-dimensional MR images were rendered by post-processing of the
original MR data on an independent workstation. The
three-dimensional MR images were interpreted, and findings are
reported in the following complete MRI report for this study. Three
dimensional images were evaluated at the independent [employer]
workstation
FINDINGS: Breast composition: d.  Extreme fibroglandular tissue.

Background parenchymal enhancement: Mild.

Right breast: There is an irregular 0.5 cm enhancing mass in the
upper central anterior right breast on subtraction image 121. There
is also an irregular enhancing mass in the lower outer quadrant of
the right breast measuring approximately 0.6 cm on subtraction image
177. There is also linear oriented non mass enhancement in the lower
outer quadrant of the right breast seen on subtraction images 137
through 152.

Left breast: Biopsy related changes are present in the lower outer
quadrant of the left breast post stereotactic guided biopsy at the 2
sites of recently diagnosed ductal carcinoma in situ. There is
stippled non mass enhancement between the 2 biopsy marking clips
with the sites of biopsy and intervening non mass enhancement
together measuring up to 4 cm. There are a few small enhancing
nodules located approximately 2.6 cm superior to the biopsy proven
sites of malignancy in the lower outer quadrant, with a small 0.5 cm
irregular mass present on subtraction image 162 and an additional
0.5 cm enhancing mass seen on subtraction image 169.

Lymph nodes: No abnormal appearing lymph nodes.

Ancillary findings:  None.
IMPRESSION: 1. The 2 sites of biopsy-proven DCIS in the lower outer quadrant of
the left breast with intervening stippled non mass enhancement all
together measures up to 4 cm.

2. There are 2 additional suspicious small enhancing masses located
approximately 2.6 cm superior to the biopsy proven sites of disease
in the left breast.

3. Indeterminate enhancing masses in upper central anterior right
breast and lower outer quadrant of the right breast as well as
linear oriented non mass enhancement in the lower outer quadrant of
the right breast.

RECOMMENDATION:
1. If breast conservation is being considered, then recommend the
patient return for second-look ultrasound and possible biopsy of the
two 0.5 cm masses seen in the lower outer quadrant of the left
breast. If these cannot be visualized and characterized
sonographically, then recommend MRI guided biopsy of these masses.

2. Recommend second-look ultrasound and possible biopsy of the 2
masses seen in the upper central anterior right breast and lower
outer quadrant of the right breast. If these cannot be visualized
sonographically, then recommend MRI guided biopsy of these 2 masses
as well as MRI guided biopsy of the linear oriented non mass
enhancement in the lower outer quadrant of the right breast.

BI-RADS CATEGORY  4: Suspicious.

## 2018-09-14 MED ORDER — GADOBUTROL 1 MMOL/ML IV SOLN
5.0000 mL | Freq: Once | INTRAVENOUS | Status: AC | PRN
  Administered 2018-09-14: 5 mL via INTRAVENOUS

## 2018-09-14 NOTE — Progress Notes (Signed)
[facility] CSW Progress Note  Referral sent for Hotel manager.  Staff message sent to [facility] re need for financial assistance.  Email sent to patient w outside financial assistance options.  Edwyna Shell, LCSW Clinical Social Worker Phone:  [phone]

## 2018-09-14 NOTE — Progress Notes (Signed)
[facility] Spiritual Care Note  Reached Shelley Andrews by phone as [place] follow-up. Per pt, she is originally from [place], started learning English in fifth grade, and moved to the [place] three years ago. She and her husband have one son together, age 49. Per pt, her support is limited to her husband and her SIL. Social isolation is a challenge, particularly re coping with dx. Per pt, her biggest worry is finances re tx.  Provided pastoral presence, empathic listening, normalization of feelings, beginning introduction to [facility] team/programming resources, and referral to [employer], who has shared further info re support programs and financial advocate. Will continue to provide emotional support and encouragement to engage in programming to build social/support network. Plan to f/u by phone tomorrow per pt request.   [name], [place], [facility] Pager [phone] Voicemail [phone]

## 2018-09-14 NOTE — Progress Notes (Signed)
[facility] CSW Progress Notes  Referral from chaplain received, called patient.  "I am just most worried about how much I have to pay for the cancer."  "I am also worried about my son, but I know I am going to survive."  Discussed concerns re finances - worried about cost of care and overall impact on finances.  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  Referred to Financial Advocate and sent information on various options for applying for financial assistance from outside organizations.  Worry about son stems from concern that he would be "all alone" if she dies - his father/her husband is in poor health due to diabetes, no other relatives live nearby.  Son is aware she is sick, does not know she has cancer.  CSW will work with patient on parenting issues related to cancer diagnosis, will need to schedule appropriate time for this more extended discussion.  Will call patient next week after initial appointments are completed to discuss parenting concerns/needs.  Linked with Stage manager.  Encouraged her to connect w [place] and [facility], included all this information in email to patient.  Edwyna Shell, LCSW Clinical Social Worker Phone:  [phone]

## 2018-09-15 ENCOUNTER — Other Ambulatory Visit: Payer: Self-pay | Admitting: General Surgery

## 2018-09-15 ENCOUNTER — Telehealth: Payer: Self-pay | Admitting: *Deleted

## 2018-09-15 ENCOUNTER — Encounter: Payer: Self-pay | Admitting: General Practice

## 2018-09-15 DIAGNOSIS — C50512 Malignant neoplasm of lower-outer quadrant of left female breast: Secondary | ICD-10-CM

## 2018-09-15 NOTE — Telephone Encounter (Signed)
error 

## 2018-09-15 NOTE — Progress Notes (Signed)
[facility] Spiritual Care Note  Followed up with Ms Gutierrez by phone as planned, meeting husband Legrand Como as well. They describe themselves as practical people who want as much information as possible--to be shown scan images, for example--in order understand the rationale for each step in pt's healthcare process. Being informed consumers and decision-makers is very important to them and will help mitigate anxiety due to uncertainty. Per their request, I have forwarded their questions and concerns to breast navigator [facility] Martini/RN. Per pt's request, I plan to f/u with the [facility] by phone late tomorrow morning.   [place], [place], [facility] Pager [phone] Voicemail [phone]

## 2018-09-16 ENCOUNTER — Other Ambulatory Visit: Payer: Self-pay | Admitting: General Surgery

## 2018-09-16 ENCOUNTER — Telehealth: Payer: Self-pay | Admitting: *Deleted

## 2018-09-16 DIAGNOSIS — C50512 Malignant neoplasm of lower-outer quadrant of left female breast: Secondary | ICD-10-CM

## 2018-09-16 NOTE — Telephone Encounter (Signed)
Spoke with patient to follow up from [facility].  Patient and husband had lots of questions regarding MRI and needing additional biopsies.  Spent 45 minutes explaining and reviewing this with her and her husband.  They would like Dr. Barry Dienes to go over her MRI and show them what is going on.  Informed them that after she has the additional biopsies Dr. Barry Dienes can see her and explain and come up with a plan. Patient and husband verbalized understanding.

## 2018-09-20 ENCOUNTER — Ambulatory Visit
Admission: RE | Admit: 2018-09-20 | Discharge: 2018-09-20 | Disposition: A | Discharge status: Home / Self Care | Payer: 59 | Source: Ambulatory Visit | Attending: General Surgery | Admitting: General Surgery

## 2018-09-20 ENCOUNTER — Other Ambulatory Visit: Payer: Self-pay

## 2018-09-20 ENCOUNTER — Other Ambulatory Visit: Payer: Self-pay | Admitting: General Surgery

## 2018-09-20 DIAGNOSIS — C50512 Malignant neoplasm of lower-outer quadrant of left female breast: Secondary | ICD-10-CM

## 2018-09-20 DIAGNOSIS — R9389 Abnormal findings on diagnostic imaging of other specified body structures: Secondary | ICD-10-CM

## 2018-09-21 ENCOUNTER — Encounter: Payer: Self-pay | Admitting: General Practice

## 2018-09-21 NOTE — Progress Notes (Signed)
[facility] Note  Reached Shelley Andrews by phone this morning to debrief about recent clarifying calls re financial concerns and procedural questions. Finances and repeat MRIs remain a concern. Outside of these concerns, pt states that she is overall doing well. She is using her Buddhist worldview (on suffering, rebirth, stewardship of the body, etc) to cope with and make meaning of her dx. She has my direct dial number and knows to contact chaplain/Team whenever needed/desired.   [place], [place], [facility] Pager [phone] Voicemail [phone]

## 2018-09-28 ENCOUNTER — Ambulatory Visit
Admission: RE | Admit: 2018-09-28 | Discharge: 2018-09-28 | Disposition: A | Discharge status: Home / Self Care | Payer: 59 | Source: Ambulatory Visit | Attending: General Surgery | Admitting: General Surgery

## 2018-09-28 ENCOUNTER — Other Ambulatory Visit: Payer: Self-pay

## 2018-09-28 DIAGNOSIS — R9389 Abnormal findings on diagnostic imaging of other specified body structures: Secondary | ICD-10-CM

## 2018-09-28 HISTORY — PX: BREAST LUMPECTOMY: SHX2

## 2018-09-28 IMAGING — MR MR BREAST BX W LOC DEV 1ST LESION IMAGE BX SPEC MR GUIDE*R*
6 of 8 series · 32 of 48 positions shown · IV contrast (5 ml gadavist)
Comparison: Previous exams.
COMPARISON: Previous exams.

Addendum:
CLINICAL DATA: The patient presents for MR guided core biopsy of 3
sites in the RIGHT breast.

EXAM:
MRI GUIDED CORE NEEDLE BIOPSY OF THE RIGHT BREAST
TECHNIQUE: Multiplanar, multisequence MR imaging of the RIGHT breast was
performed both before and after administration of intravenous
contrast.
CONTRAST:  5 ml Gadavist

[Series 2: fiducial bilateral · sagittal · 2.0mm · 1.17mm/px · 6 of 144 slices shown]
[im 1/144]
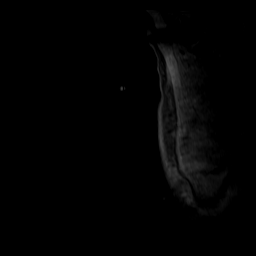
[im 29/144]
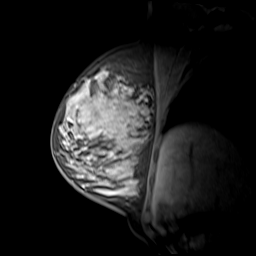
[im 58/144]
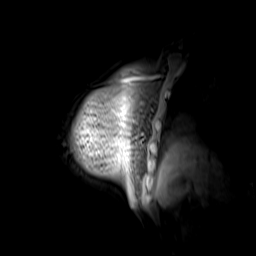
[im 86/144]
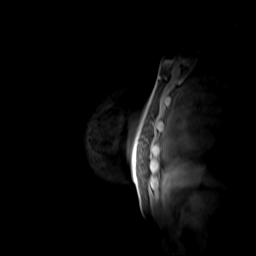
[im 115/144]
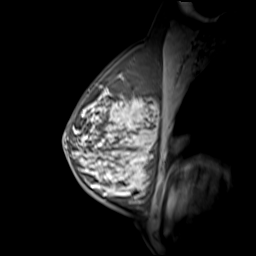
[im 144/144]
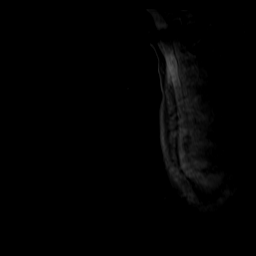

[Series 3: dynamic pre · axial · non-contrast · 1.3mm · 0.73mm/px · z∈[-103,+104]mm · 6 of 160 slices shown]
[im 1/160]
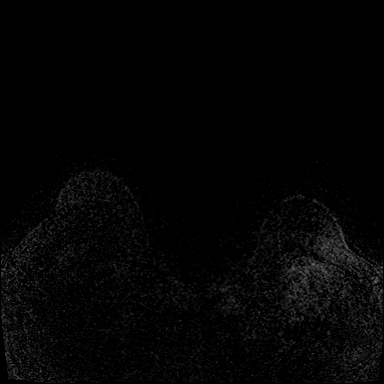
[im 32/160]
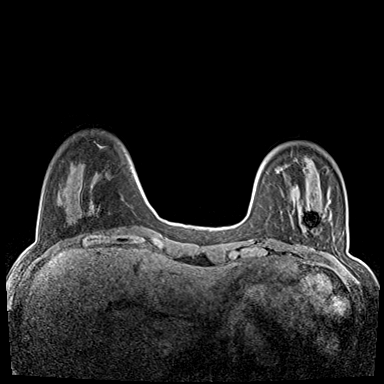
[im 64/160]
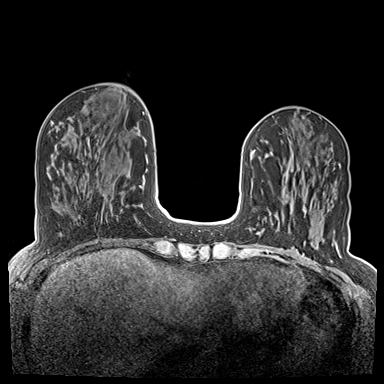
[im 96/160]
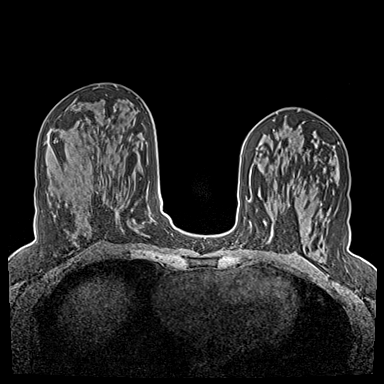
[im 128/160]
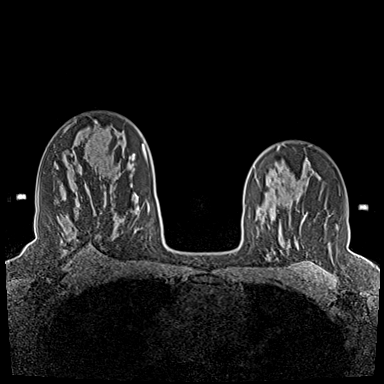
[im 160/160]
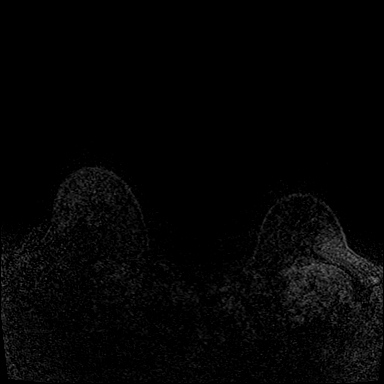

[Series 4: dynamic post 20 · axial · 1.3mm · 0.73mm/px · z∈[-103,+104]mm · 6 of 160 slices shown (1 of 2)]
[im 1/160]
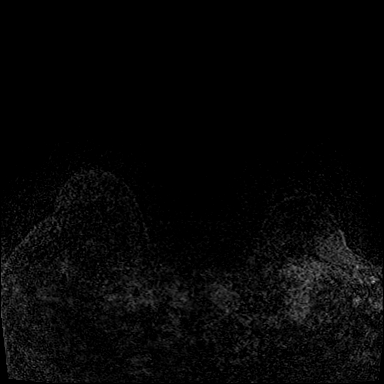
[im 32/160]
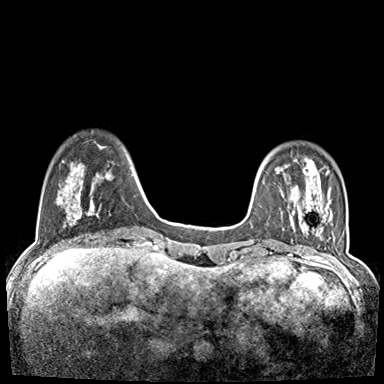
[im 64/160]
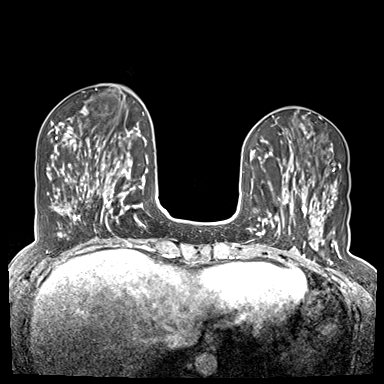
[im 96/160]
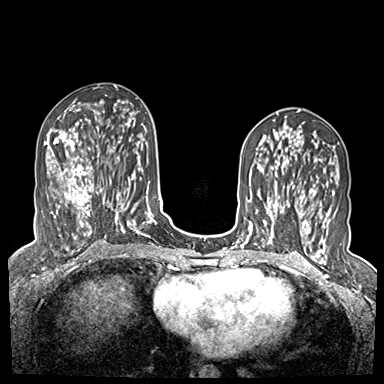
[im 128/160]
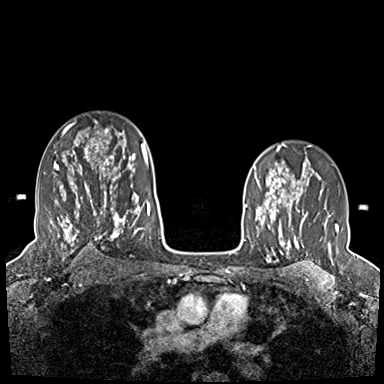
[im 160/160]
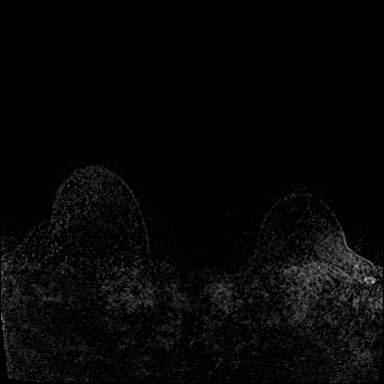

[Series 5: dynamic post 20 · axial · 1.3mm · 0.73mm/px · z∈[-103,+104]mm · 6 of 160 slices shown (2 of 2)]
[im 1/160]
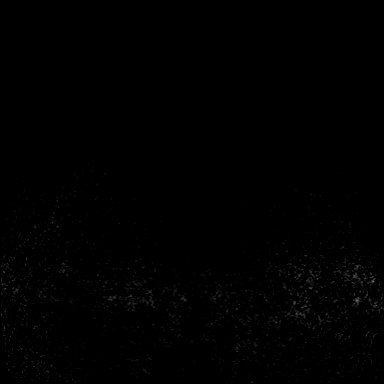
[im 32/160]
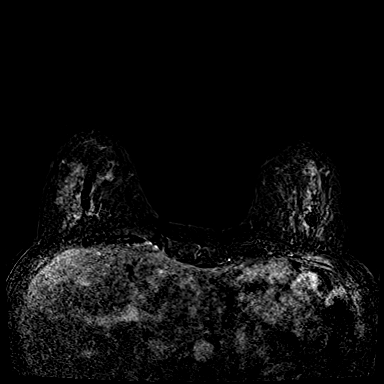
[im 64/160]
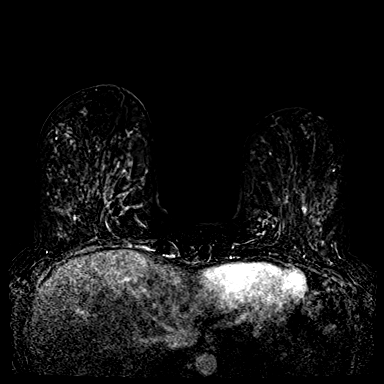
[im 96/160]
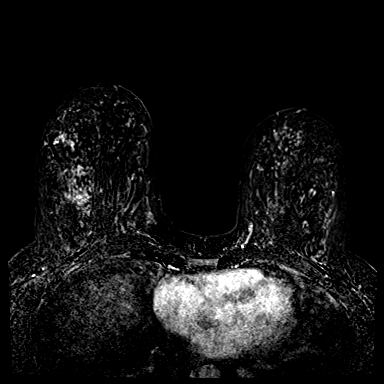
[im 128/160]
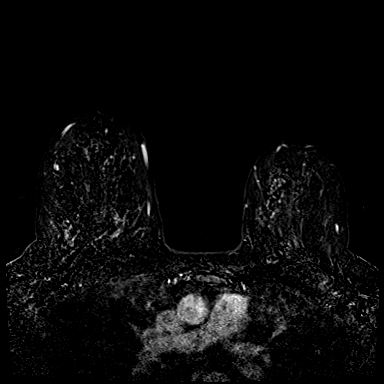
[im 160/160]
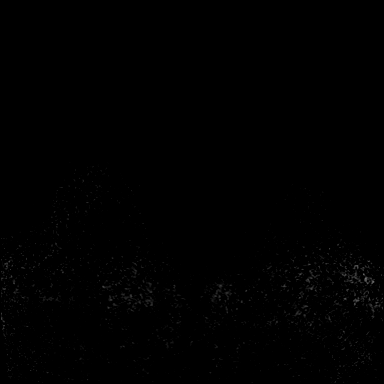

[Series 6: needle confirmation · axial · 1.3mm · 0.73mm/px · z∈[-103,+104]mm · 6 of 160 slices shown]
[im 1/160]
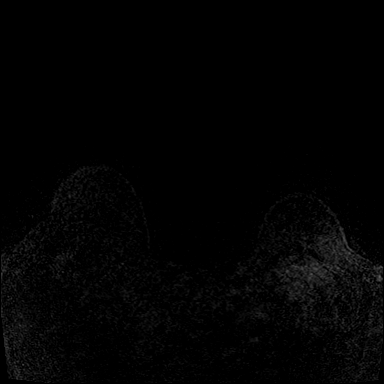
[im 32/160]
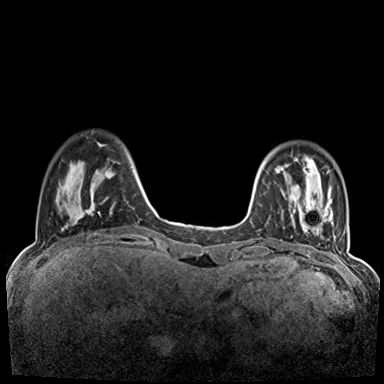
[im 64/160]
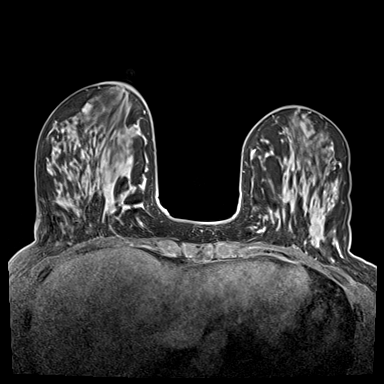
[im 96/160]
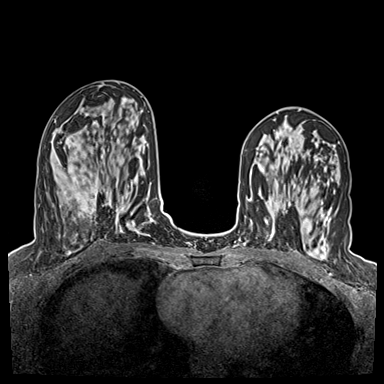
[im 128/160]
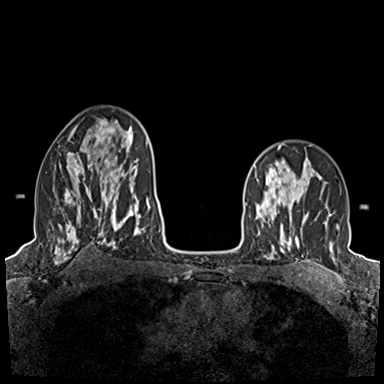
[im 160/160]
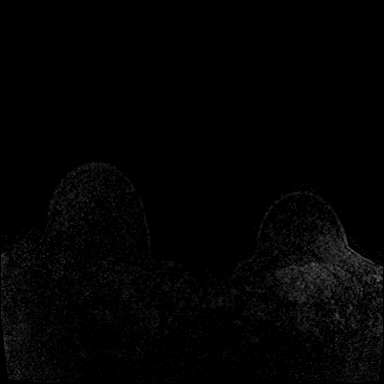

[Series 7: needle confirmation_sub · axial · 1.3mm · 0.73mm/px · z∈[-103,-63]mm · 2 of 160 slices shown]
[im 1/160]
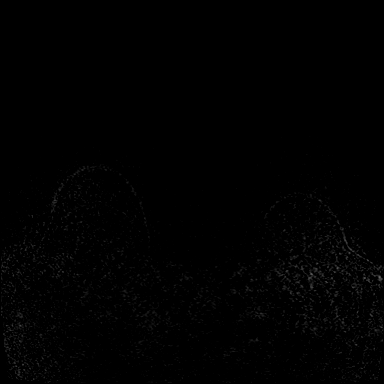
[im 32/160]
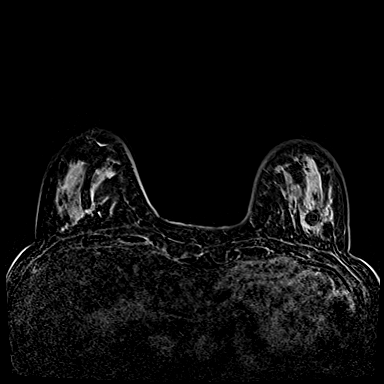

[32 of 48 positions shown; findings below may reference images not displayed]

FINDINGS: I met with the patient, and we discussed the procedure of MRI guided
biopsy, including risks, benefits, and alternatives. Specifically,
we discussed the risks of infection, bleeding, tissue injury, clip
migration, and inadequate sampling. Informed, written consent was
given. The usual time out protocol was performed immediately prior
to the procedure.

Site 1:

Using sterile technique, 1% Lidocaine, MRI guidance, and a 9 gauge
vacuum assisted device, biopsy was performed of a small mass in the
posterior LOWER OUTER QUADRANT of the RIGHT breast using a LATERAL
to MEDIAL approach. At the conclusion of the procedure, a barbell
shaped tissue marker clip was deployed into the biopsy cavity.

Site 2:

Using sterile technique, 1% Lidocaine, MRI guidance, and a 9 gauge
vacuum assisted device, biopsy was performed of non mass enhancement
in the LOWER OUTER QUADRANT of the RIGHT breast using a LATERAL to
MEDIAL approach. At the conclusion of the procedure, a cylinder
shaped tissue marker clip was deployed into the biopsy cavity.

Site 3:

Using sterile technique, 1% Lidocaine, MRI guidance, and a 9 gauge
vacuum assisted device, biopsy was performed of small mass in the
UPPER central anterior portion of the RIGHT breast using a cyst
LATERAL to MEDIAL approach. At the conclusion of the procedure, a
barbell shaped tissue marker clip was deployed into the biopsy
cavity.

For follow-up 2-view mammogram was performed and dictated
separately.
IMPRESSION: MRI guided biopsy of 3 sites in the RIGHT breast. No apparent
complications.

ADDENDUM:
Pathology revealed FIBROCYSTIC CHANGES of the RIGHT breast, lower
outer quadrant posterior mass (barbell clip). This was found to be
concordant by Dr. ZMY.

Pathology revealed FIBROCYSTIC CHANGES, PSEUDOANGIOMATOUS STROMAL
HYPERPLASIA (PASH), FIBROADENOMA of the RIGHT breast, lower outer
quadrant non mass enhancement (cylinder clip). This was found to be
concordant by Dr. ZMY.

Pathology revealed FIBROCYSTIC CHANGES, PSEUDOANGIOMATOUS STROMAL
HYPERPLASIA (PASH) of the RIGHT breast, anterior (barbell clip).
This was found to be concordant by Dr. ZMY.

Pathology results were discussed with the patient by telephone. The
patient reported doing well after the biopsies with tenderness at
the sites. Post biopsy instructions and care were reviewed and
questions were answered. The patient was encouraged to call The

The patient has a recent diagnosis of LEFT breast cancer and should
follow her outlined treatment plan. The patient was instructed to
return for a bilateral breast MRI in 6 months, per protocol.

Dr. ZMY was notified of biopsy results via [REDACTED] message on
[DATE].

Pathology results reported by ZMY, RN on [DATE].

*** End of Addendum ***
FINDINGS: I met with the patient, and we discussed the procedure of MRI guided
biopsy, including risks, benefits, and alternatives. Specifically,
we discussed the risks of infection, bleeding, tissue injury, clip
migration, and inadequate sampling. Informed, written consent was
given. The usual time out protocol was performed immediately prior
to the procedure.

Site 1:

Using sterile technique, 1% Lidocaine, MRI guidance, and a 9 gauge
vacuum assisted device, biopsy was performed of a small mass in the
posterior LOWER OUTER QUADRANT of the RIGHT breast using a LATERAL
to MEDIAL approach. At the conclusion of the procedure, a barbell
shaped tissue marker clip was deployed into the biopsy cavity.

Site 2:

Using sterile technique, 1% Lidocaine, MRI guidance, and a 9 gauge
vacuum assisted device, biopsy was performed of non mass enhancement
in the LOWER OUTER QUADRANT of the RIGHT breast using a LATERAL to
MEDIAL approach. At the conclusion of the procedure, a cylinder
shaped tissue marker clip was deployed into the biopsy cavity.

Site 3:

Using sterile technique, 1% Lidocaine, MRI guidance, and a 9 gauge
vacuum assisted device, biopsy was performed of small mass in the
UPPER central anterior portion of the RIGHT breast using a cyst
LATERAL to MEDIAL approach. At the conclusion of the procedure, a
barbell shaped tissue marker clip was deployed into the biopsy
cavity.

For follow-up 2-view mammogram was performed and dictated
separately.
IMPRESSION: MRI guided biopsy of 3 sites in the RIGHT breast. No apparent
complications.

## 2018-09-28 IMAGING — MG MM CLIP PLACEMENT
4 series · 4 of 12 positions shown · non-contrast
Comparison: [DATE] and earlier

CLINICAL DATA: Status post MR guided core biopsies of 3 sites in
the RIGHT breast.

EXAM:
DIAGNOSTIC RIGHT MAMMOGRAM POST MRI BIOPSY OF 3 SITES

[R ML synth-2D]
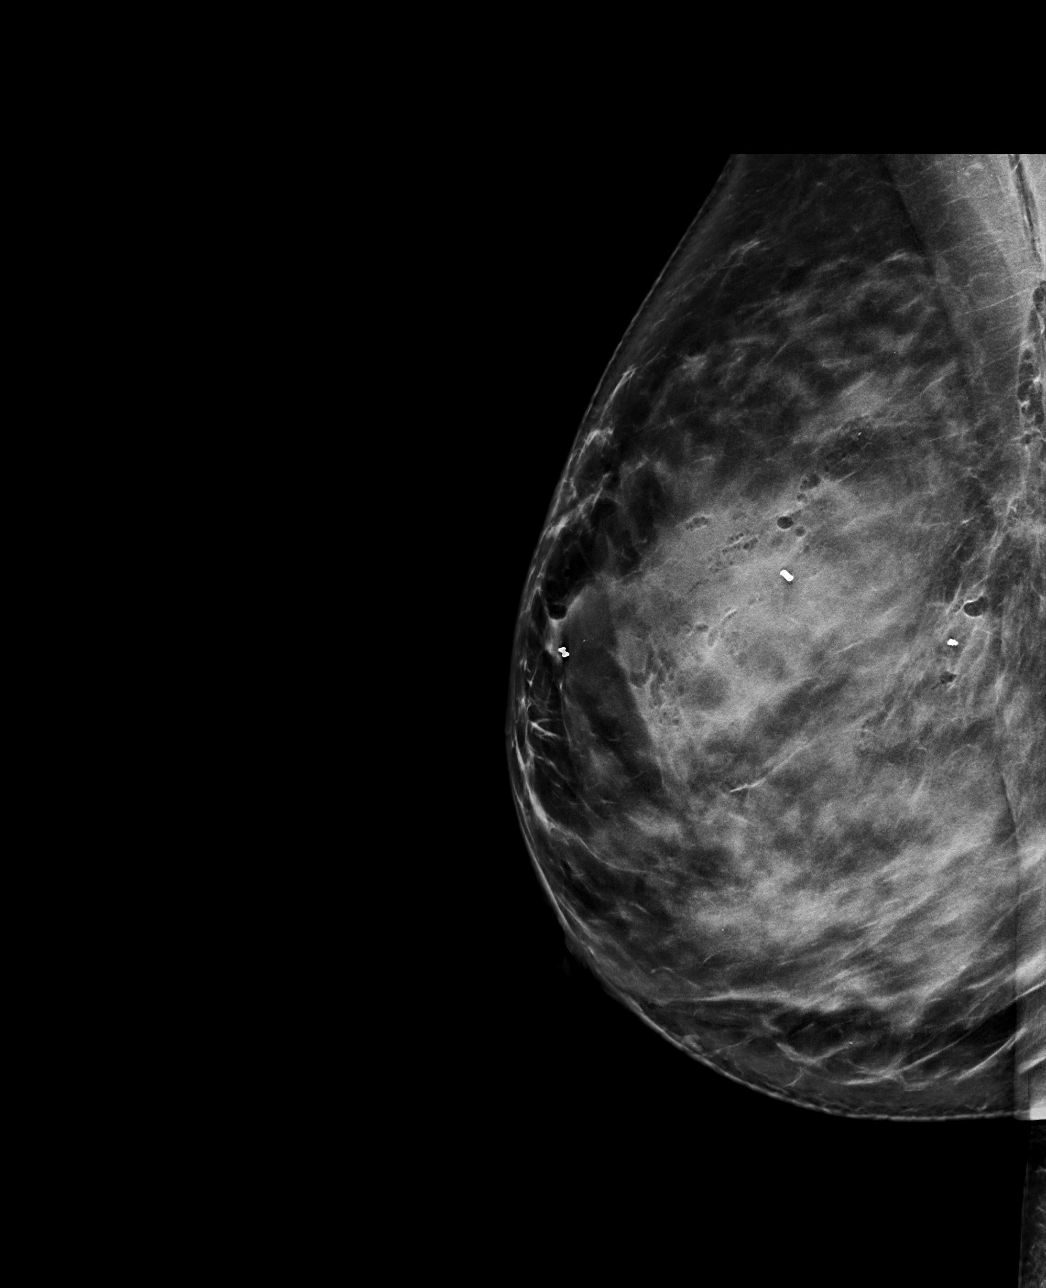

[R CC synth-2D]
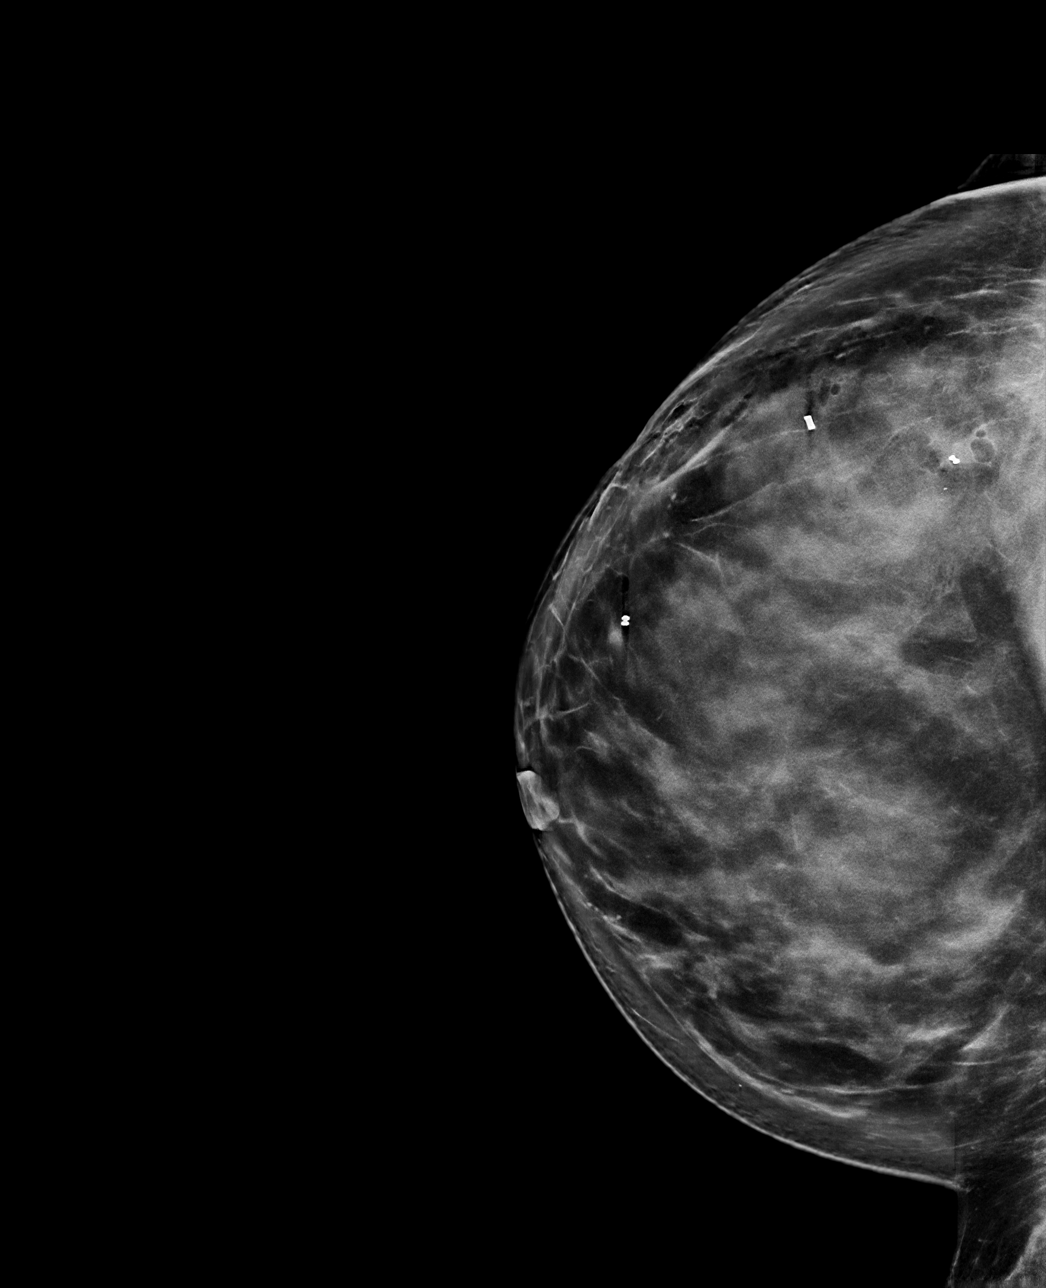

[R CC tomo · tomo slice 55/110.0]
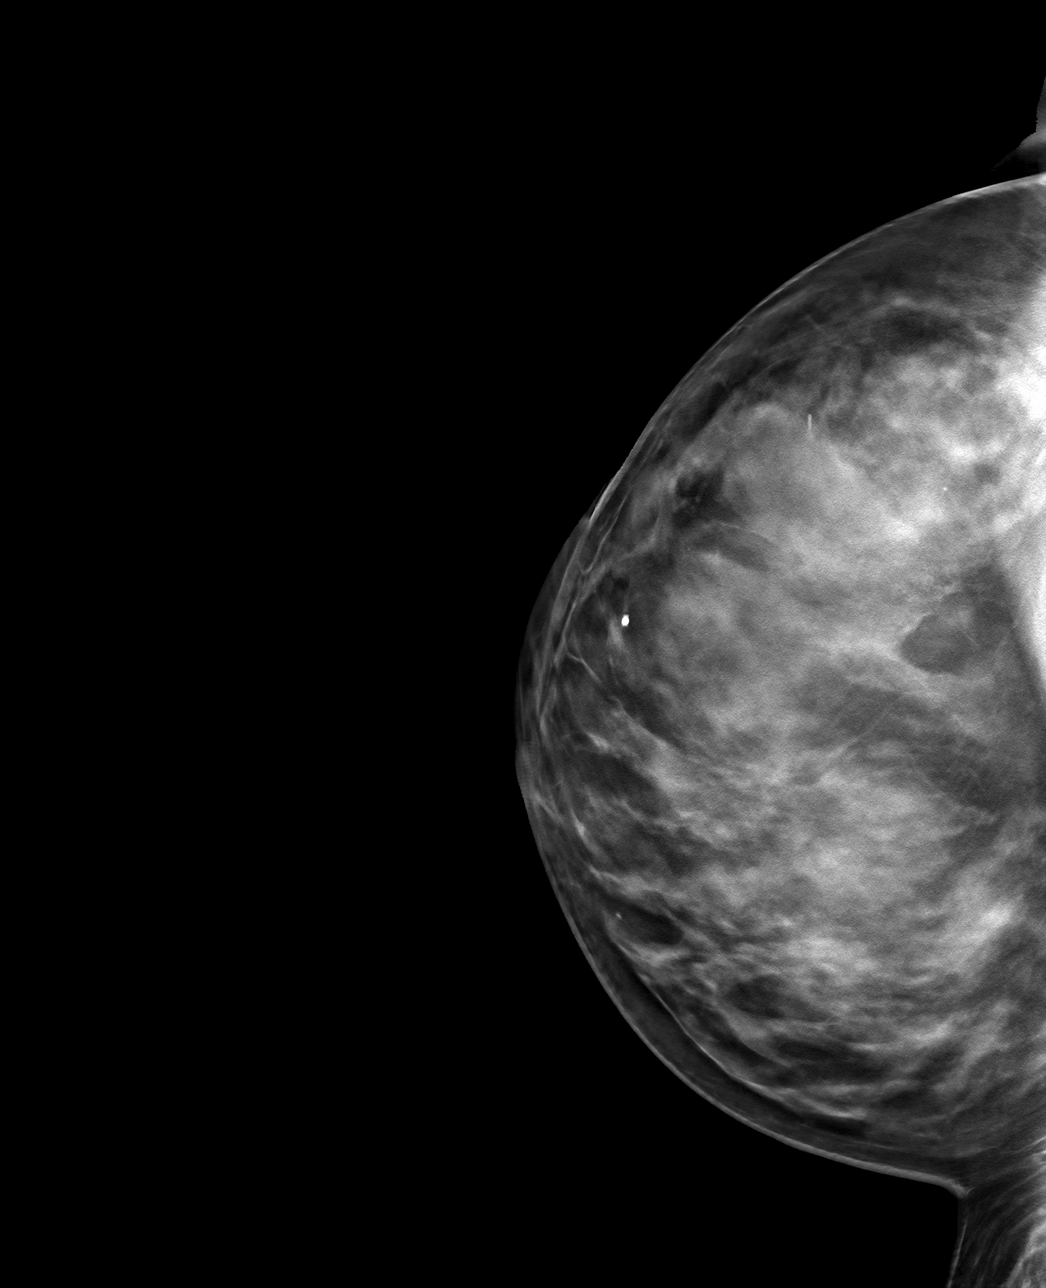

[R ML tomo · tomo slice 49/97.0]
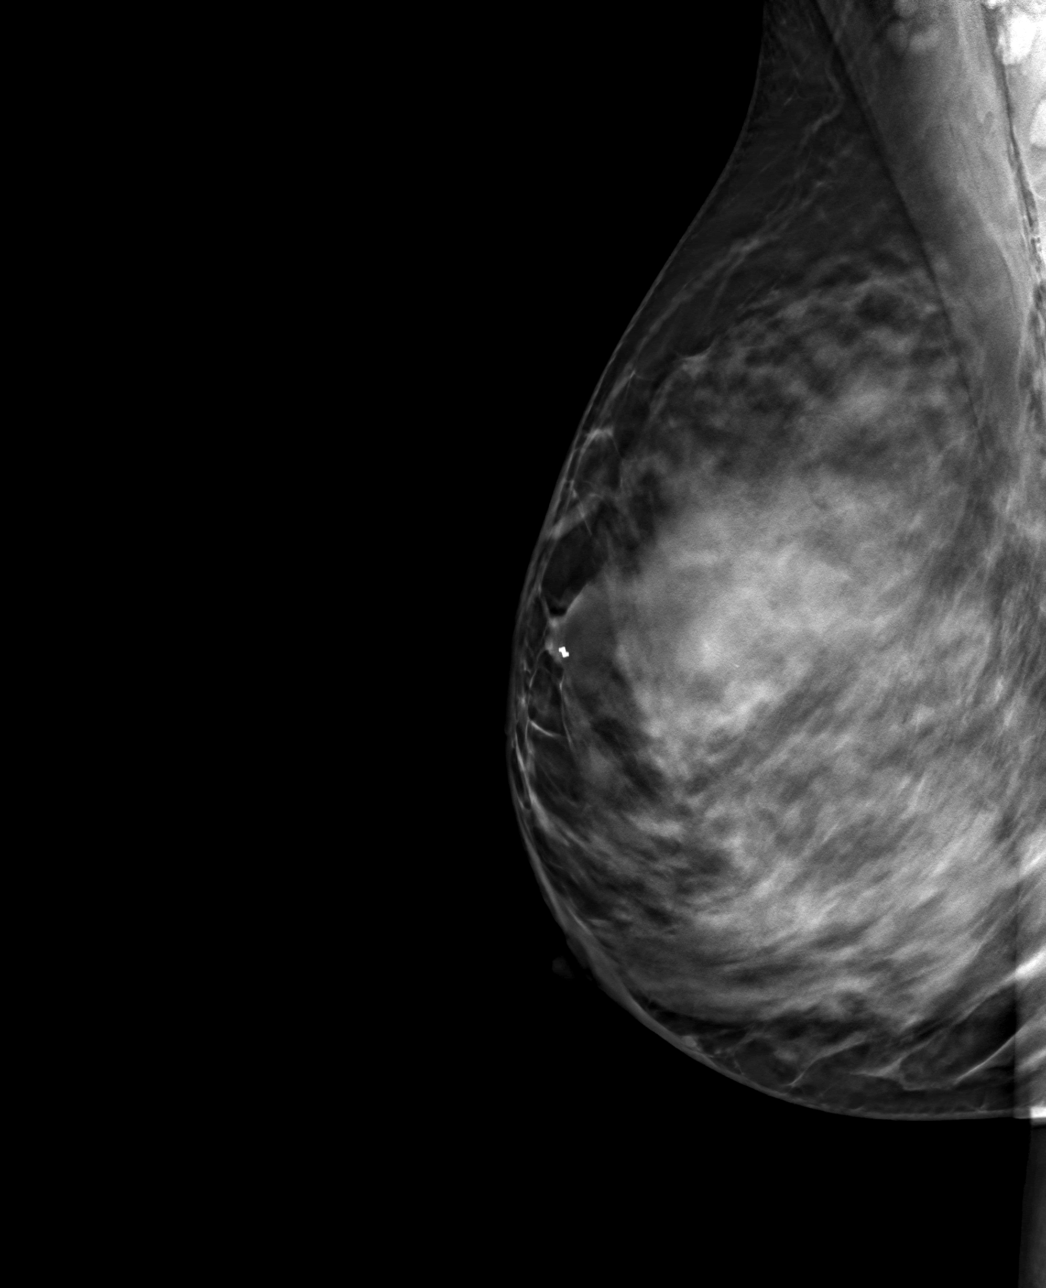

[4 of 12 positions shown; findings below may reference images not displayed]

FINDINGS: Mammographic images were obtained following MRI guided biopsy of 3
lesions in the RIGHT breast.

Following biopsy of small mass in the posterior LOWER OUTER QUADRANT
of the RIGHT breast and placement of a barbell shaped, the clip is
in the expected location in the posterior LOWER OUTER QUADRANT.

Following biopsy of non mass enhancement in the LOWER OUTER QUADRANT
of the RIGHT breast and placement of a cylinder-shaped clip, the
clip is in expected location in the LATERAL aspect of the breast.

Following biopsy of small mass in the superior central anterior
portion of the RIGHT breast and placement of a barbell shaped clip,
clip is in expected location.
IMPRESSION: Tissue marker clips are in the expected locations after biopsy.

Final Assessment: Post Procedure Mammograms for Marker Placement

## 2018-09-28 MED ORDER — GADOBUTROL 1 MMOL/ML IV SOLN
5.0000 mL | Freq: Once | INTRAVENOUS | Status: AC | PRN
  Administered 2018-09-28: 5 mL via INTRAVENOUS

## 2018-09-29 ENCOUNTER — Ambulatory Visit
Admission: RE | Admit: 2018-09-29 | Discharge: 2018-09-29 | Disposition: A | Discharge status: Home / Self Care | Payer: 59 | Source: Ambulatory Visit | Attending: General Surgery | Admitting: General Surgery

## 2018-09-29 DIAGNOSIS — R9389 Abnormal findings on diagnostic imaging of other specified body structures: Secondary | ICD-10-CM

## 2018-09-29 IMAGING — MR MR BREAST BIOPSY
6 of 10 series · 25 of 48 positions shown · IV contrast (6ml gadavist)
Comparison: Previous exams.
COMPARISON: Previous exams.
COMPARISON: Previous exams.

Addendum:
CLINICAL DATA: Recently diagnosed DCIS within the lower LEFT breast
by stereotactic biopsies. Subsequent MRI revealing 2 additional
enhancing masses within the lower outer quadrant of the LEFT breast
for which MRI guided biopsy is recommended.

EXAM:
MRI GUIDED CORE NEEDLE BIOPSY OF THE LEFT BREAST x2
TECHNIQUE: Multiplanar, multisequence MR imaging of the LEFT breast was
performed both before and after administration of intravenous
contrast.
CONTRAST:  6 cc Gadavist

[Series 2: fiducial unilateral · sagittal · 2.0mm · 1.33mm/px · 3 of 52 slices shown]
[im 1/52]
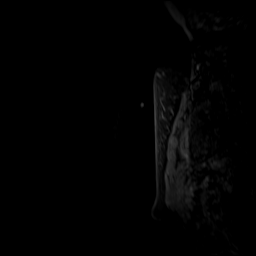
[im 26/52]
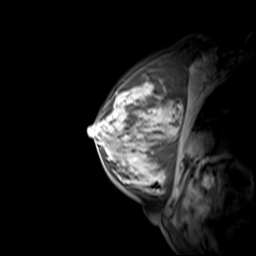
[im 52/52]
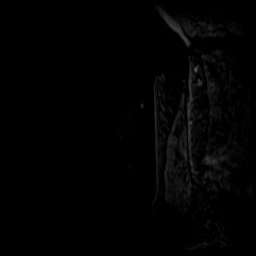

[Series 3: dynamic pre · axial · non-contrast · 1.3mm · 0.73mm/px · z∈[-119,+108]mm · 5 of 176 slices shown]
[im 1/176]
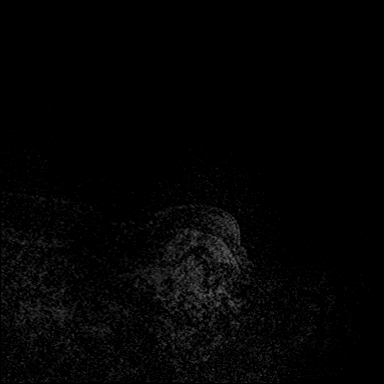
[im 44/176]
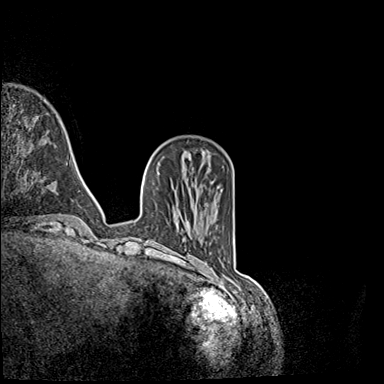
[im 88/176]
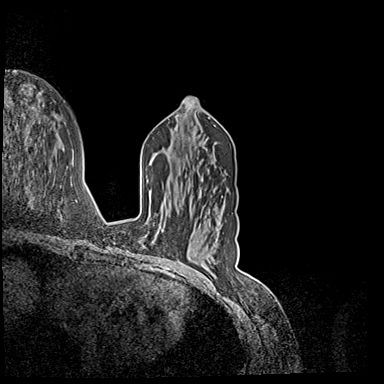
[im 132/176]
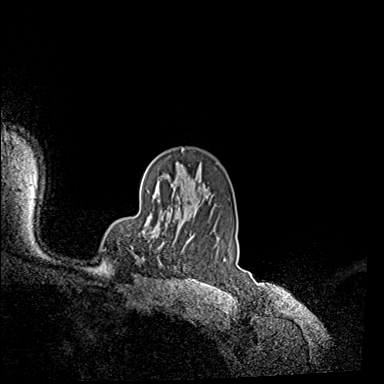
[im 176/176]
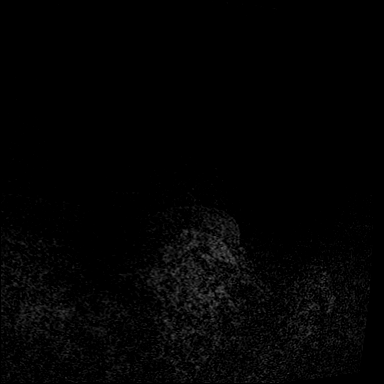

[Series 4: dynamic post 20 · axial · 1.3mm · 0.73mm/px · z∈[-119,+108]mm · 5 of 176 slices shown (1 of 2)]
[im 1/176]
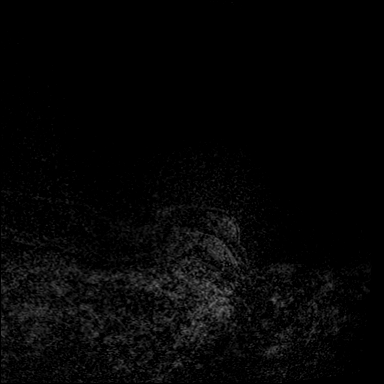
[im 44/176]
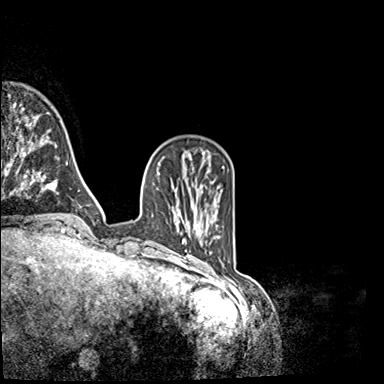
[im 88/176]
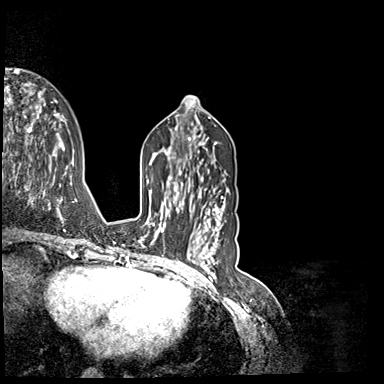
[im 132/176]
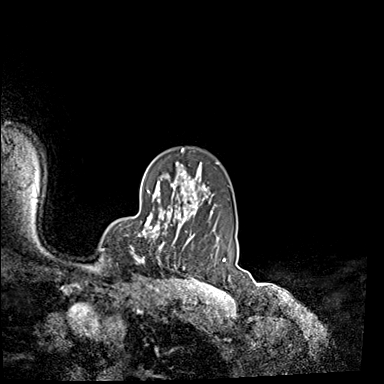
[im 176/176]
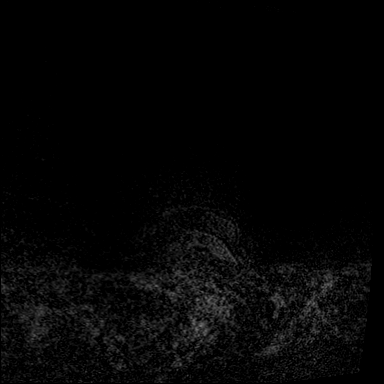

[Series 5: dynamic post 20 · axial · 1.3mm · 0.73mm/px · z∈[-119,+108]mm · 5 of 176 slices shown (2 of 2)]
[im 1/176]
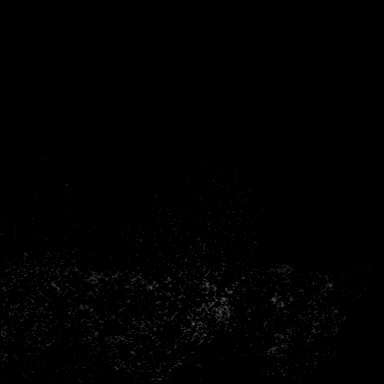
[im 44/176]
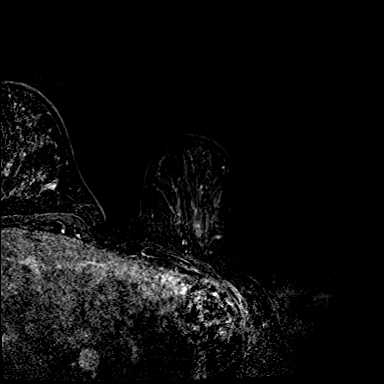
[im 88/176]
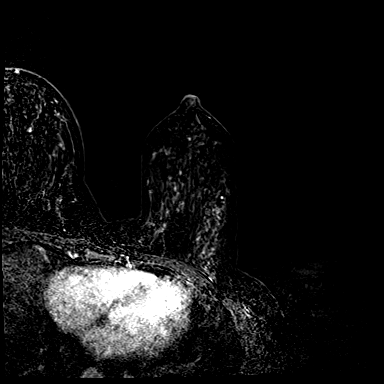
[im 132/176]
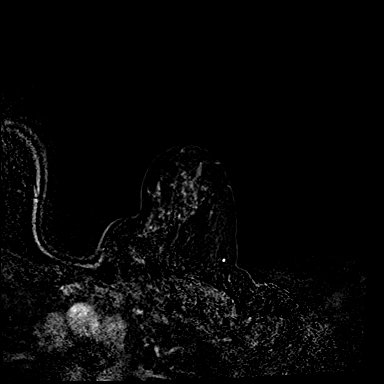
[im 176/176]
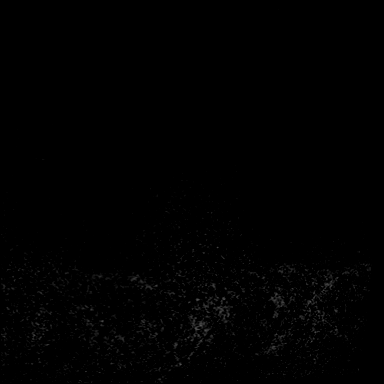

[Series 6: dynamic post 3 · axial · 1.3mm · 0.73mm/px · z∈[-119,+108]mm · 5 of 176 slices shown (1 of 2)]
[im 1/176]
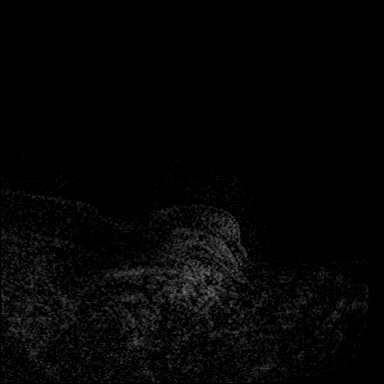
[im 44/176]
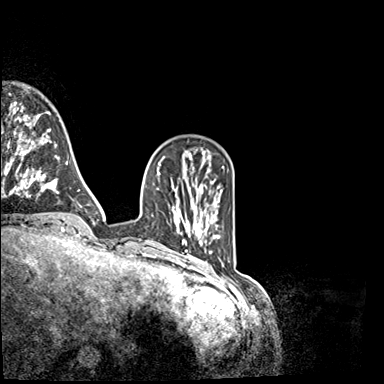
[im 88/176]
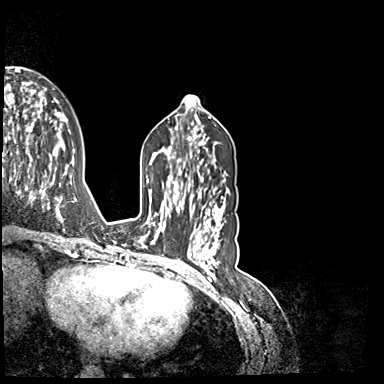
[im 132/176]
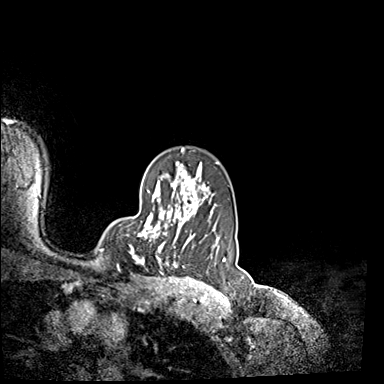
[im 176/176]
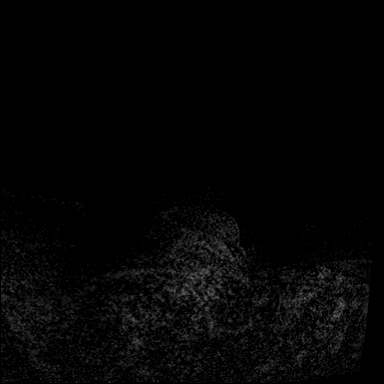

[Series 7: dynamic post 3 · axial · 1.3mm · 0.73mm/px · z∈[-119,-63]mm · 2 of 176 slices shown (2 of 2)]
[im 1/176]
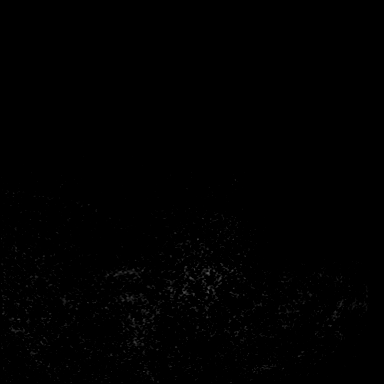
[im 44/176]
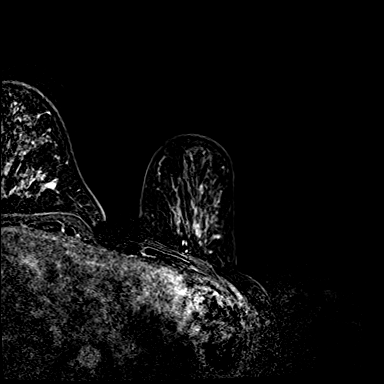

[25 of 48 positions shown; findings below may reference images not displayed]

FINDINGS: I met with the patient, and we discussed the procedure of MRI guided
biopsy, including risks, benefits, and alternatives. Specifically,
we discussed the risks of infection, bleeding, tissue injury, clip
migration, and inadequate sampling. Informed, written consent was
given. The usual time out protocol was performed immediately prior
to the procedure.

Using sterile technique, 1% Lidocaine, MRI guidance, and a 9 gauge
vacuum assisted device, biopsy was performed of the 5 mm
circumscribed enhancing mass within the lower outer quadrant of the
LEFT breast, at posterior depth (slightly inferior) using a lateral
approach. At the conclusion of the procedure, a dumbbell shaped
tissue marker clip was deployed into the biopsy cavity. Follow-up
2-view mammogram was performed and dictated separately.

Next, using sterile technique, 1% Lidocaine, MRI guidance, and a 9
gauge vacuum assisted device, biopsy was performed of the 5 mm
irregular enhancing mass within the lower outer quadrant of the LEFT
breast, at posterior depth (slightly superior) using a lateral
approach. At the conclusion of the procedure, a cylinder shaped
tissue marker clip was deployed into the biopsy cavity. Follow-up
2-view mammogram was performed and dictated separately.
IMPRESSION: 1. MRI guided biopsy of the 5 mm circumscribed enhancing mass within
the lower outer quadrant of the LEFT breast, at posterior depth
(slightly inferior). Dumbbell-shaped clip placed at the biopsy site.
2. MRI guided biopsy of the 5 mm irregular enhancing mass within the
lower outer quadrant of the LEFT breast, at posterior depth
(slightly superior). Cylinder shaped clip placed at the biopsy site.

No apparent complications.

ADDENDUM:
Pathology revealed FIBROCYSTIC CHANGES, ADENOSIS, PSEUDOANGIOMATOUS
STROMAL HYPERPLASIA (PASH) of the lower outer quadrant, posterior,
(slightly inferior). This was found to be concordant by Dr. JENDRIK
JENDRIK.

Pathology revealed FIBROCYSTIC CHANGES, PSEUDOANGIOMATOUS STROMAL
HYPERPLASIA (PASH) of the Left breast, lower outer quadrant,
posterior, (slightly superior). This was found to be concordant by
Dr. JENDRIK.

Pathology results were discussed with the patient by telephone. The
patient reported doing well after the biopsies with tenderness at
the sites. Post biopsy instructions and care were reviewed and
questions were answered. The patient was encouraged to call The

The patient has a recent diagnosis of left breast cancer and should
follow her outlined treatment plan. The patient was instructed to
return in 6 months for a bilateral breast MRI, per protocol.

Dr. JENDRIK was notified of biopsy results via [REDACTED] message on
[DATE].

Pathology results reported by JENDRIK, RN on [DATE].

ADDENDUM:
Images are reviewed at the request Dr. JENDRIK for surgical planning.
The initial 2 biopsies performed in the LOWER central portion of the
LEFT breast, marked with a coil shaped clip and an X shaped clip are
4.5 centimeters apart. Other biopsies were benign and concordant.

The coil shaped clip and the X shaped clip in the LOWER central
portion of the LEFT breast are amenable to bracketed seed
localization.

*** End of Addendum ***
Addendum:
FINDINGS: I met with the patient, and we discussed the procedure of MRI guided
biopsy, including risks, benefits, and alternatives. Specifically,
we discussed the risks of infection, bleeding, tissue injury, clip
migration, and inadequate sampling. Informed, written consent was
given. The usual time out protocol was performed immediately prior
to the procedure.

Using sterile technique, 1% Lidocaine, MRI guidance, and a 9 gauge
vacuum assisted device, biopsy was performed of the 5 mm
circumscribed enhancing mass within the lower outer quadrant of the
LEFT breast, at posterior depth (slightly inferior) using a lateral
approach. At the conclusion of the procedure, a dumbbell shaped
tissue marker clip was deployed into the biopsy cavity. Follow-up
2-view mammogram was performed and dictated separately.

Next, using sterile technique, 1% Lidocaine, MRI guidance, and a 9
gauge vacuum assisted device, biopsy was performed of the 5 mm
irregular enhancing mass within the lower outer quadrant of the LEFT
breast, at posterior depth (slightly superior) using a lateral
approach. At the conclusion of the procedure, a cylinder shaped
tissue marker clip was deployed into the biopsy cavity. Follow-up
2-view mammogram was performed and dictated separately.
IMPRESSION: 1. MRI guided biopsy of the 5 mm circumscribed enhancing mass within
the lower outer quadrant of the LEFT breast, at posterior depth
(slightly inferior). Dumbbell-shaped clip placed at the biopsy site.
2. MRI guided biopsy of the 5 mm irregular enhancing mass within the
lower outer quadrant of the LEFT breast, at posterior depth
(slightly superior). Cylinder shaped clip placed at the biopsy site.

No apparent complications.

ADDENDUM:
Pathology revealed FIBROCYSTIC CHANGES, ADENOSIS, PSEUDOANGIOMATOUS
STROMAL HYPERPLASIA (PASH) of the lower outer quadrant, posterior,
(slightly inferior). This was found to be concordant by Dr. JENDRIK
JENDRIK.

Pathology revealed FIBROCYSTIC CHANGES, PSEUDOANGIOMATOUS STROMAL
HYPERPLASIA (PASH) of the Left breast, lower outer quadrant,
posterior, (slightly superior). This was found to be concordant by
Dr. JENDRIK.

Pathology results were discussed with the patient by telephone. The
patient reported doing well after the biopsies with tenderness at
the sites. Post biopsy instructions and care were reviewed and
questions were answered. The patient was encouraged to call The

The patient has a recent diagnosis of left breast cancer and should
follow her outlined treatment plan. The patient was instructed to
return in 6 months for a bilateral breast MRI, per protocol.

Dr. JENDRIK was notified of biopsy results via [REDACTED] message on
[DATE].

Pathology results reported by JENDRIK, RN on [DATE].

*** End of Addendum ***
FINDINGS: I met with the patient, and we discussed the procedure of MRI guided
biopsy, including risks, benefits, and alternatives. Specifically,
we discussed the risks of infection, bleeding, tissue injury, clip
migration, and inadequate sampling. Informed, written consent was
given. The usual time out protocol was performed immediately prior
to the procedure.

Using sterile technique, 1% Lidocaine, MRI guidance, and a 9 gauge
vacuum assisted device, biopsy was performed of the 5 mm
circumscribed enhancing mass within the lower outer quadrant of the
LEFT breast, at posterior depth (slightly inferior) using a lateral
approach. At the conclusion of the procedure, a dumbbell shaped
tissue marker clip was deployed into the biopsy cavity. Follow-up
2-view mammogram was performed and dictated separately.

Next, using sterile technique, 1% Lidocaine, MRI guidance, and a 9
gauge vacuum assisted device, biopsy was performed of the 5 mm
irregular enhancing mass within the lower outer quadrant of the LEFT
breast, at posterior depth (slightly superior) using a lateral
approach. At the conclusion of the procedure, a cylinder shaped
tissue marker clip was deployed into the biopsy cavity. Follow-up
2-view mammogram was performed and dictated separately.
IMPRESSION: 1. MRI guided biopsy of the 5 mm circumscribed enhancing mass within
the lower outer quadrant of the LEFT breast, at posterior depth
(slightly inferior). Dumbbell-shaped clip placed at the biopsy site.
2. MRI guided biopsy of the 5 mm irregular enhancing mass within the
lower outer quadrant of the LEFT breast, at posterior depth
(slightly superior). Cylinder shaped clip placed at the biopsy site.

No apparent complications.

## 2018-09-29 IMAGING — MG MM CLIP PLACEMENT
4 series · 4 of 12 positions shown · non-contrast
Comparison: Previous exam(s).

CLINICAL DATA: Post MRI guided biopsies of 2 enhancing masses
within the lower outer quadrant of the LEFT breast.

EXAM:
DIAGNOSTIC LEFT MAMMOGRAM POST MRI BIOPSY x2

[L CC synth-2D]
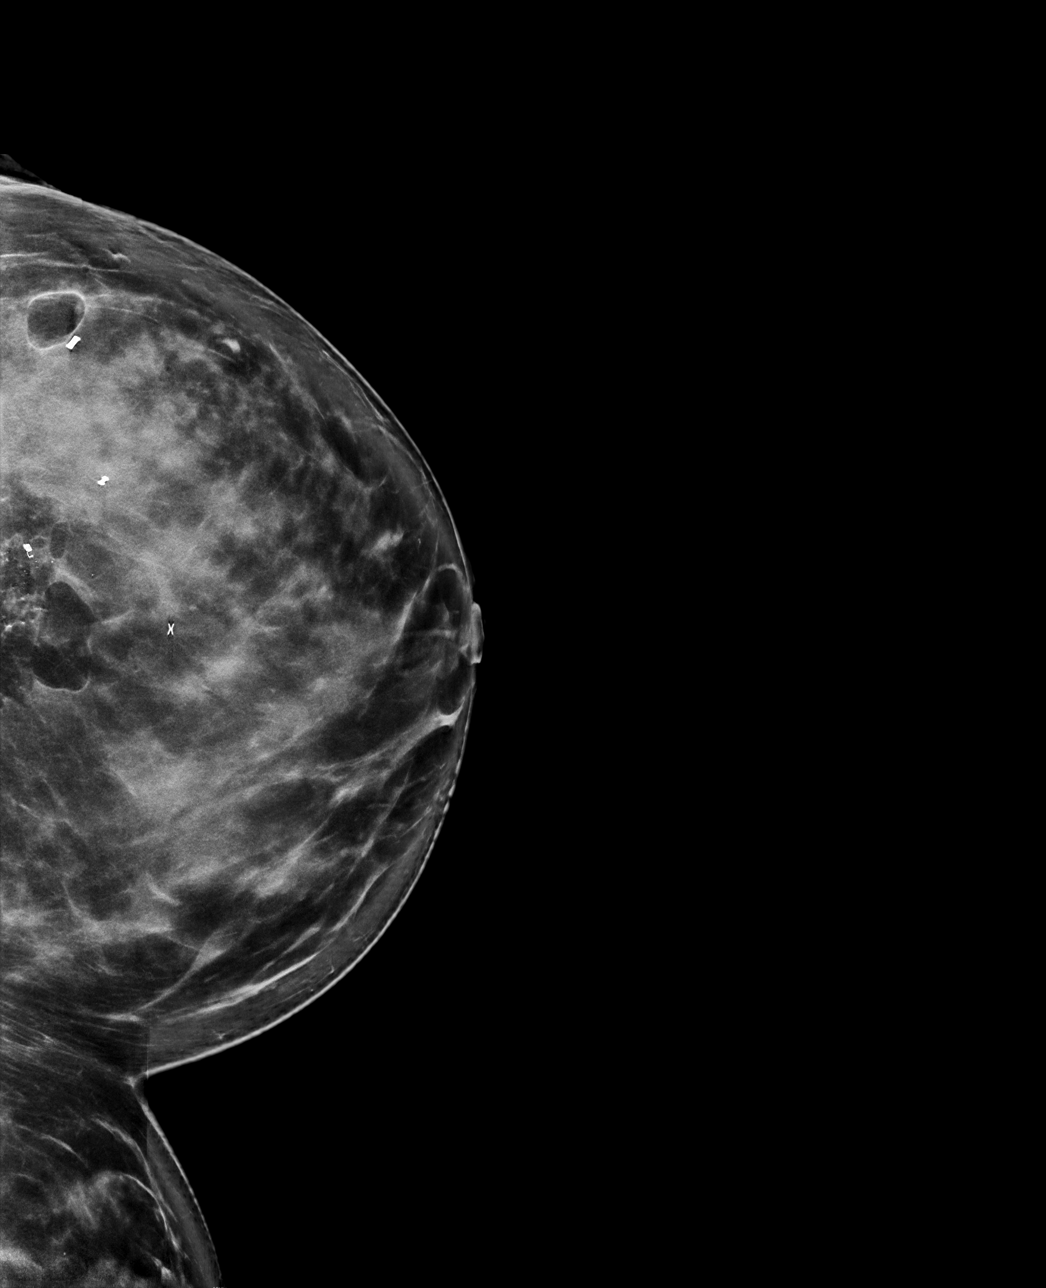

[L ML synth-2D]
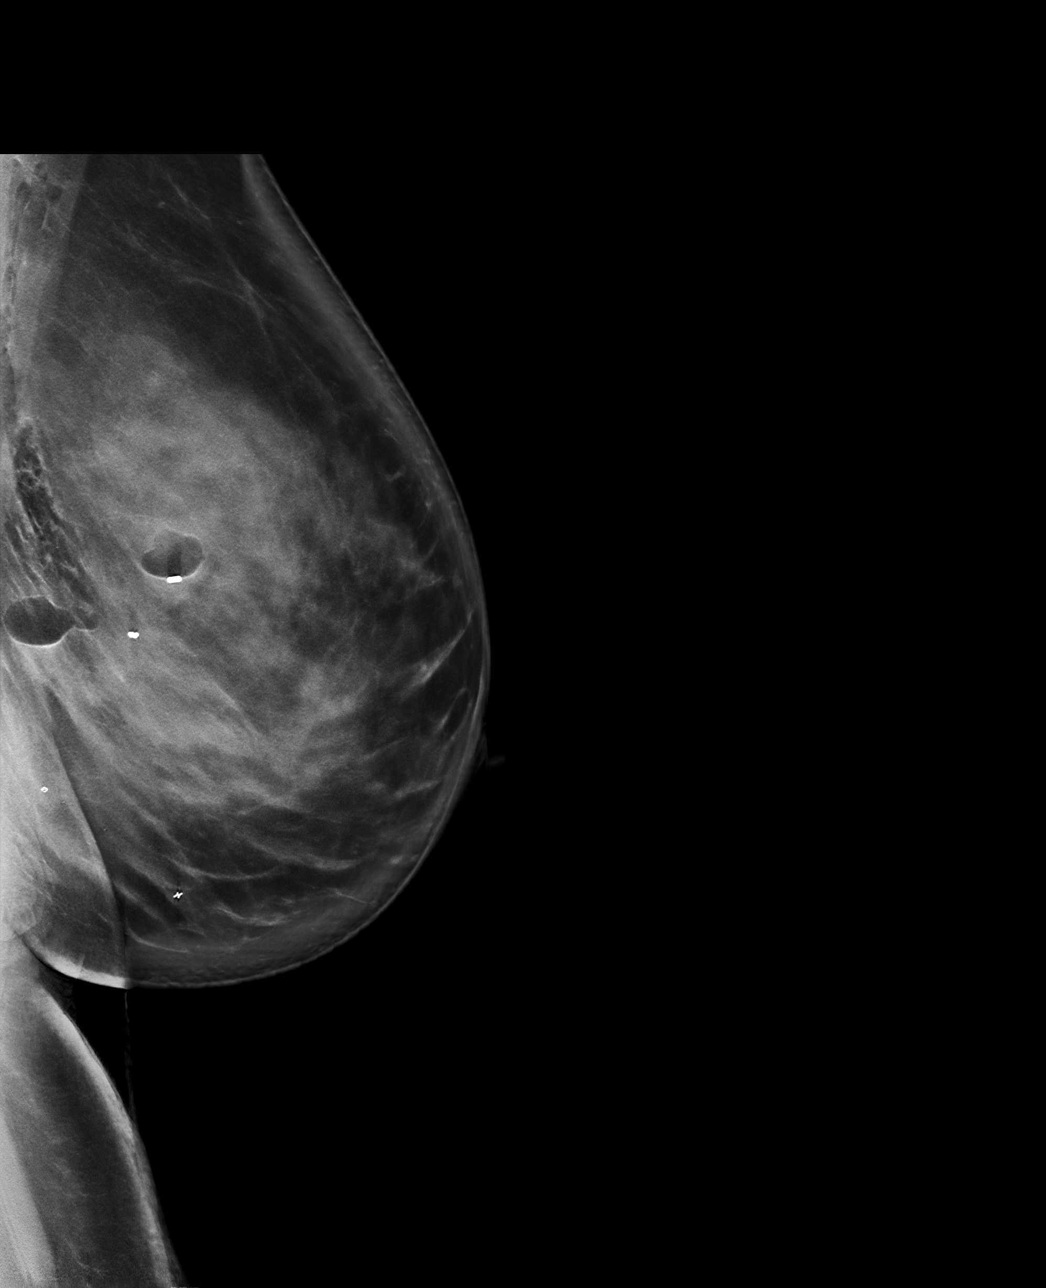

[L ML tomo · tomo slice 59/118.0]
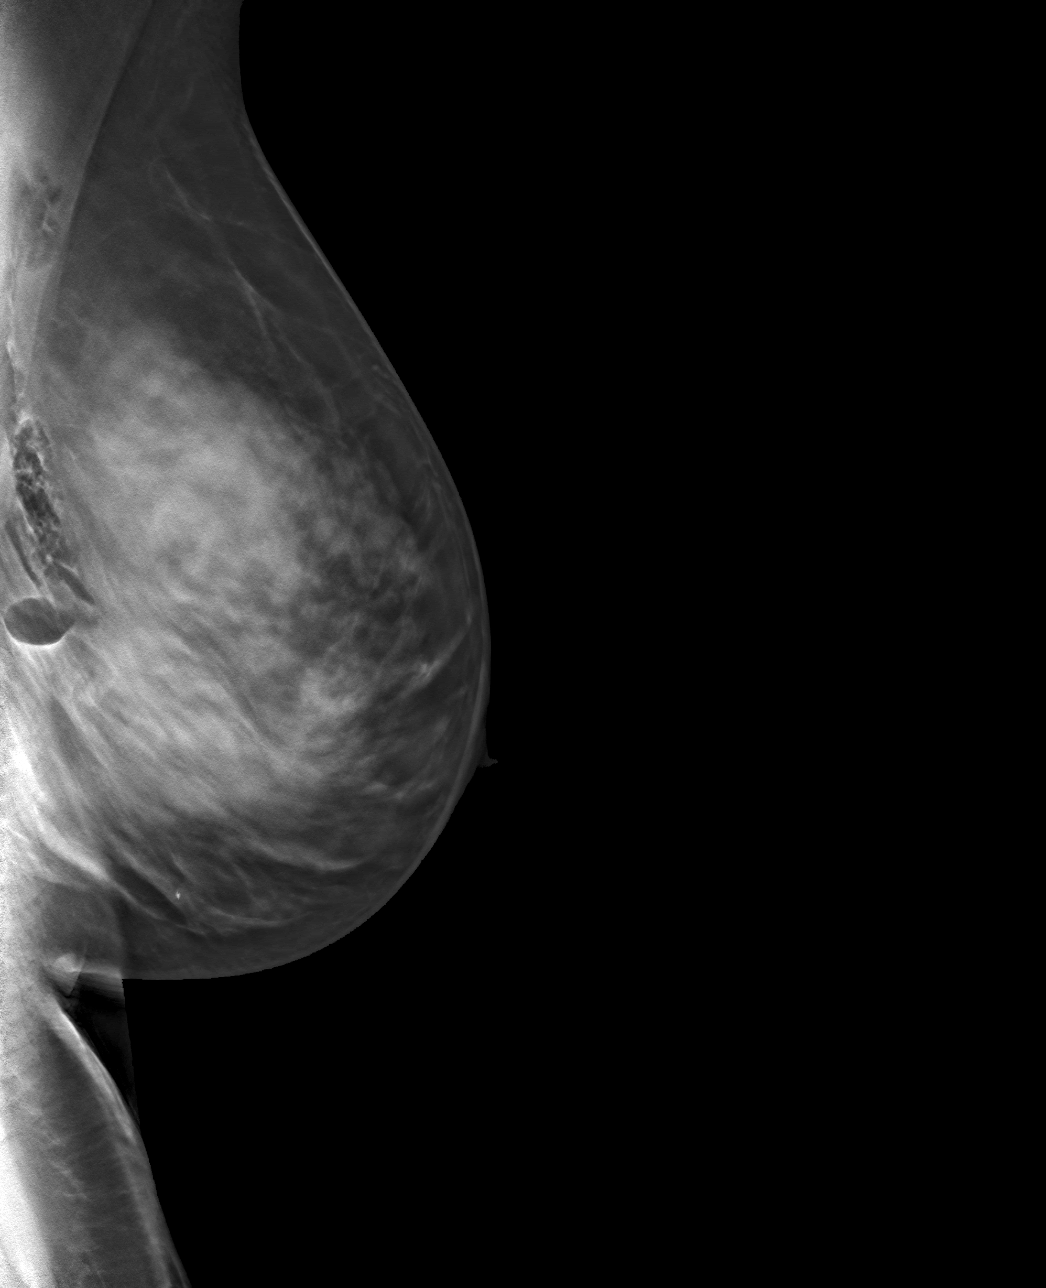

[L CC tomo · tomo slice 53/104.0]
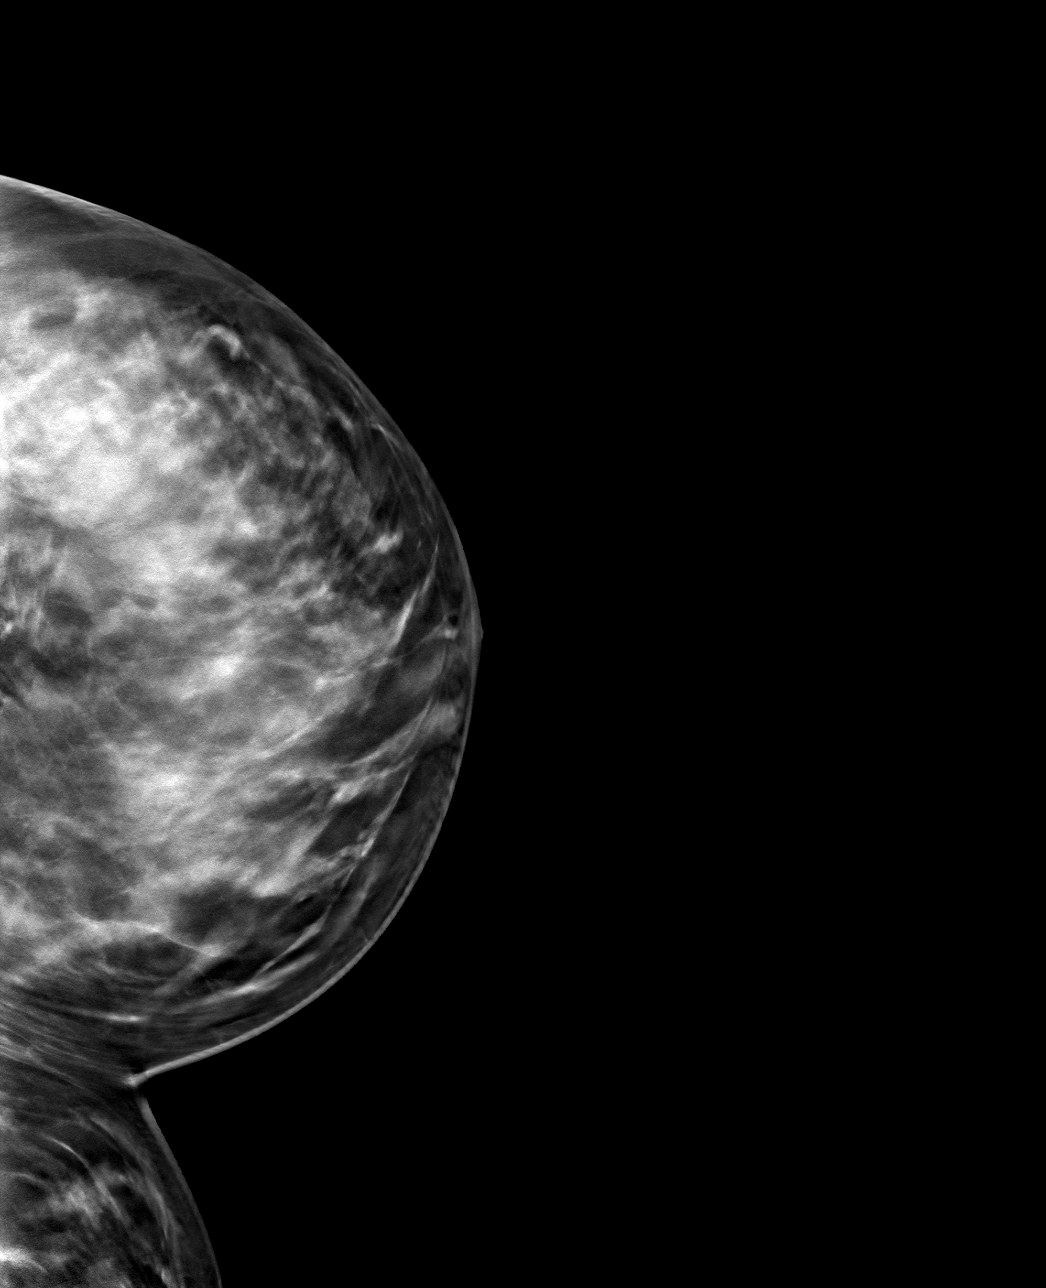

[4 of 12 positions shown; findings below may reference images not displayed]

FINDINGS: Mammographic images were obtained following MRI guided biopsy of 2
enhancing masses within the lower outer quadrant of the LEFT breast,
both at posterior depth. Cylinder shaped clip is appropriately
positioned at the site of the irregular enhancing mass within the
lower outer quadrant of the LEFT breast, at posterior depth
(slightly superior). Dumbbell-shaped clip is appropriately
positioned at the site of the enhancing mass within the lower outer
quadrant, at posterior depth (slightly inferior).
IMPRESSION: 1. Cylinder shaped clip is appropriately positioned at the site of
the irregular enhancing mass within the lower outer quadrant of the
LEFT breast, at posterior depth (slightly superior).
2. Dumbbell-shaped clip is appropriately positioned at the site of
the circumscribed enhancing mass within the lower outer quadrant of
the LEFT breast, at posterior depth (slightly inferior).

Final Assessment: Post Procedure Mammograms for Marker Placement

## 2018-09-29 MED ORDER — GADOBUTROL 1 MMOL/ML IV SOLN
6.0000 mL | Freq: Once | INTRAVENOUS | Status: AC | PRN
  Administered 2018-09-29: 6 mL via INTRAVENOUS

## 2018-10-04 ENCOUNTER — Encounter: Payer: Self-pay | Admitting: *Deleted

## 2018-10-07 ENCOUNTER — Other Ambulatory Visit: Payer: Self-pay | Admitting: General Surgery

## 2018-10-07 DIAGNOSIS — C50512 Malignant neoplasm of lower-outer quadrant of left female breast: Secondary | ICD-10-CM

## 2018-10-11 ENCOUNTER — Other Ambulatory Visit: Payer: Self-pay | Admitting: General Surgery

## 2018-10-11 ENCOUNTER — Other Ambulatory Visit: Payer: Self-pay | Admitting: *Deleted

## 2018-10-11 DIAGNOSIS — C50512 Malignant neoplasm of lower-outer quadrant of left female breast: Secondary | ICD-10-CM

## 2018-10-11 DIAGNOSIS — D0512 Intraductal carcinoma in situ of left breast: Secondary | ICD-10-CM

## 2018-10-13 NOTE — Progress Notes (Signed)
[facility] [address], [place] [address] Phone: [phone] Fax: [phone]     Your procedure is scheduled on July 23rd.  Report to [facility] Main Entrance "A" at 5:30 A.M., and check in at the Admitting office.  Call this number if you have problems the morning of surgery:  [phone]  Call [phone] if you have any questions prior to your surgery date Monday-Friday 8am-4pm    Remember:  Do not eat after midnight the night before your surgery  You may drink clear liquids until 4:30 AM the morning of your surgery.   Clear liquids allowed are: Water, Non-Citrus Juices (without pulp), Carbonated Beverages, Clear Tea, Black Coffee Only, and Gatorade    Take these medicines the morning of surgery with A SIP OF WATER - NONE  7 days prior to surgery STOP taking any Aspirin (unless otherwise instructed by your surgeon), Aleve, Naproxen, Ibuprofen, Motrin, Advil, Goody's, BC's, all herbal medications, fish oil, and all vitamins.    The Morning of Surgery  Do not wear jewelry, make-up or nail polish.  Do not wear lotions, powders, or perfumes, or deodorant  Do not shave 48 hours prior to surgery.    Do not bring valuables to the hospital.  [facility] is not responsible for any belongings or valuables.  If you are a smoker, DO NOT Smoke 24 hours prior to surgery IF you wear a CPAP at night please bring your mask, tubing, and machine the morning of surgery   Remember that you must have someone to transport you home after your surgery, and remain with you for 24 hours if you are discharged the same day.   Contacts, glasses, hearing aids, dentures or bridgework may not be worn into surgery.    Leave your suitcase in the car.  After surgery it may be brought to your room.  For patients admitted to the hospital, discharge time will be determined by your treatment team.  Patients discharged the day  of surgery will not be allowed to drive home.    Special instructions:   [facility]- Preparing For Surgery  Before surgery, you can play an important role. Because skin is not sterile, your skin needs to be as free of germs as possible. You can reduce the number of germs on your skin by washing with CHG (chlorahexidine gluconate) Soap before surgery.  CHG is an antiseptic cleaner which kills germs and bonds with the skin to continue killing germs even after washing.    Oral Hygiene is also important to reduce your risk of infection.  Remember - BRUSH YOUR TEETH THE MORNING OF SURGERY WITH YOUR REGULAR TOOTHPASTE  Please do not use if you have an allergy to CHG or antibacterial soaps. If your skin becomes reddened/irritated stop using the CHG.  Do not shave (including legs and underarms) for at least 48 hours prior to first CHG shower. It is OK to shave your face.  Please follow these instructions carefully.   1. Shower the NIGHT BEFORE SURGERY and the MORNING OF SURGERY with CHG Soap.   2. If you chose to wash your hair, wash your hair first as usual with your normal shampoo.  3. After you shampoo, rinse your hair and body thoroughly to remove the shampoo.  4. Use CHG as you would any other liquid soap. You can apply CHG directly to the skin and wash gently with a scrungie or a clean washcloth.  5. Apply the CHG Soap to your body ONLY FROM THE NECK DOWN.  Do not use on open wounds or open sores. Avoid contact with your eyes, ears, mouth and genitals (private parts). Wash Face and genitals (private parts)  with your normal soap.   6. Wash thoroughly, paying special attention to the area where your surgery will be performed.  7. Thoroughly rinse your body with warm water from the neck down.  8. DO NOT shower/wash with your normal soap after using and rinsing off the CHG Soap.  9. Pat yourself dry with a CLEAN TOWEL.  10. Wear CLEAN PAJAMAS to bed the night before surgery, wear  comfortable clothes the morning of surgery  11. Place CLEAN SHEETS on your bed the night of your first shower and DO NOT SLEEP WITH PETS.   Day of Surgery:  Do not apply any deodorants/lotions. Please shower the morning of surgery with the CHG soap  Please wear clean clothes to the hospital/surgery center.   Remember to brush your teeth WITH YOUR REGULAR TOOTHPASTE.   Please read over the following fact sheets that you were given.

## 2018-10-14 ENCOUNTER — Encounter (HOSPITAL_COMMUNITY)
Admission: RE | Admit: 2018-10-14 | Discharge: 2018-10-14 | Disposition: A | Discharge status: Home / Self Care | Payer: 59 | Source: Ambulatory Visit | Attending: Cardiovascular Disease | Admitting: Cardiovascular Disease

## 2018-10-14 ENCOUNTER — Other Ambulatory Visit: Payer: Self-pay

## 2018-10-14 ENCOUNTER — Encounter (HOSPITAL_COMMUNITY): Payer: Self-pay

## 2018-10-14 DIAGNOSIS — Z1159 Encounter for screening for other viral diseases: Secondary | ICD-10-CM | POA: Diagnosis not present

## 2018-10-14 DIAGNOSIS — Z01812 Encounter for preprocedural laboratory examination: Secondary | ICD-10-CM | POA: Diagnosis present

## 2018-10-14 HISTORY — DX: Gestational diabetes mellitus in pregnancy, unspecified control: O24.419

## 2018-10-14 LAB — POCT PREGNANCY, URINE: Preg Test, Ur: NEGATIVE

## 2018-10-14 LAB — CBC
HCT: 39.8 % (ref 36.0–46.0)
Hemoglobin: 12.9 g/dL (ref 12.0–15.0)
MCH: 30.3 pg (ref 26.0–34.0)
MCHC: 32.4 g/dL (ref 30.0–36.0)
MCV: 93.4 fL (ref 80.0–100.0)
Platelets: 326 10*3/uL (ref 150–400)
RBC: 4.26 MIL/uL (ref 3.87–5.11)
RDW: 12.6 % (ref 11.5–15.5)
WBC: 6.8 10*3/uL (ref 4.0–10.5)
nRBC: 0 % (ref 0.0–0.2)

## 2018-10-14 NOTE — Anesthesia Preprocedure Evaluation (Addendum)
Anesthesia Evaluation  Patient identified by MRN, date of birth, ID band Patient awake    Reviewed: Allergy & Precautions, NPO status , Patient's Chart, lab work & pertinent test results  Airway Mallampati: II  TM Distance: >3 FB Neck ROM: Full    Dental  (+) Teeth Intact, Dental Advisory Given   Pulmonary former smoker,    breath sounds clear to auscultation       Cardiovascular  Rhythm:Regular Rate:Normal     Neuro/Psych    GI/Hepatic   Endo/Other  diabetes  Renal/GU      Musculoskeletal   Abdominal   Peds  Hematology   Anesthesia Other Findings   Reproductive/Obstetrics                            Anesthesia Physical Anesthesia Plan  ASA: II  Anesthesia Plan: General   Post-op Pain Management:    Induction: Intravenous  PONV Risk Score and Plan: Ondansetron and Dexamethasone  Airway Management Planned: LMA  Additional Equipment:   Intra-op Plan:   Post-operative Plan:   Informed Consent: I have reviewed the patients History and Physical, chart, labs and discussed the procedure including the risks, benefits and alternatives for the proposed anesthesia with the patient or authorized representative who has indicated his/her understanding and acceptance.     Dental advisory given  Plan Discussed with: CRNA and Anesthesiologist  Anesthesia Plan Comments: (PAT note written 10/14/2018 by Myra Gianotti, PA-C. )       Anesthesia Quick Evaluation

## 2018-10-14 NOTE — Progress Notes (Signed)
PCP - Dr. Harlene Ramus Cardiologist - n/a  Chest x-ray - n/a EKG - n/a Stress Test - patient denies ECHO - patient denies Cardiac Cath - patient denies  Sleep Study - patient denies CPAP -   Fasting Blood Sugar - n/a Checks Blood Sugar _____ times a day  Blood Thinner Instructions:n/a Aspirin Instructions:  Anesthesia review: n/a  Patient denies shortness of breath, fever, cough and chest pain at PAT appointment  Coronavirus Screening  Have you experienced the following symptoms:  Cough yes/no: No Fever (>100.79F)  yes/no: No Runny nose yes/no: No Sore throat yes/no: No Difficulty breathing/shortness of breath  yes/no: No  Have you or a family member traveled in the last 14 days and where? yes/no: No   If the patient indicates "YES" to the above questions, their PAT will be rescheduled to limit the exposure to others and, the surgeon will be notified. THE PATIENT WILL NEED TO BE ASYMPTOMATIC FOR 14 DAYS.   If the patient is not experiencing any of these symptoms, the PAT nurse will instruct them to NOT bring anyone with them to their appointment since they may have these symptoms or traveled as well.   Please remind your patients and families that hospital visitation restrictions are in effect and the importance of the restrictions.    Patient verbalized understanding of instructions that were given to them at the PAT appointment. Patient was also instructed that they will need to review over the PAT instructions again at home before surgery.

## 2018-10-14 NOTE — Progress Notes (Signed)
Anesthesia Chart Review:  Case: [id number] Date/Time: 10/20/18 0715   Procedure: LEFT BREAST LUMPECTOMY WITH BRACKETED RADIOACTIVE SEED LOCALIZATION (Left Breast)   Anesthesia type: General   Pre-op diagnosis: left breast cancer   Location: [facility] OR ROOM 09 / [facility] OR   Surgeon: Stark Klein, MD    She is scheduled for bracketed radioactive seed implantation on 10/19/2018.   DISCUSSION: Patient is a 48 year old female scheduled for the above procedure.  History includes former smoker (quit 07/08/2018), left breast cancer (DCIS, diagnosed 08/2018), gestational diabetes.  Urine pregnancy test negative on 10/14/2018.  She is scheduled for presurgical COVID-19 test on 10/17/2018.  If negative and otherwise no acute changes then I anticipate that she can proceed as planned.   VS: BP 118/60   Pulse 67   Temp 36.6 C   Resp 18   Ht 5\' 1"  (1.549 m)   Wt 57.6 kg   SpO2 98%   BMI 23.98 kg/m    PROVIDERS: Rankins, Bill Salinas, MD is PCP Magrinat, Sarajane Jews, MD is HEM-ONC Eppie Gibson, MD is RAD-ONC.  She is being considered for postoperative radiotherapy.   LABS: Labs reviewed: Acceptable for surgery. CMET WNL on 09/07/18. (all labs ordered are listed, but only abnormal results are displayed)  Labs Reviewed  CBC  PREGNANCY, URINE  POCT PREGNANCY, URINE    EKG: N/A   CV: N/A  Past Medical History:  Diagnosis Date  . Breast cancer ([place])   . Family history of breast cancer   . Gestational diabetes     Past Surgical History:  Procedure Laterality Date  . CESAREAN SECTION      MEDICATIONS: No current outpatient medications on file.   No current facility-administered medications for this encounter.      Myra Gianotti, PA-C Surgical Short Stay/Anesthesiology [facility] Phone [phone] [facility] Phone [phone] 10/14/2018 3:51 PM

## 2018-10-17 ENCOUNTER — Other Ambulatory Visit (HOSPITAL_COMMUNITY)
Admission: RE | Admit: 2018-10-17 | Discharge: 2018-10-17 | Disposition: A | Discharge status: Home / Self Care | Payer: 59 | Source: Ambulatory Visit | Attending: General Surgery | Admitting: General Surgery

## 2018-10-17 DIAGNOSIS — Z01812 Encounter for preprocedural laboratory examination: Secondary | ICD-10-CM | POA: Diagnosis not present

## 2018-10-18 LAB — SARS CORONAVIRUS 2 (TAT 6-24 HRS): SARS Coronavirus 2: NEGATIVE

## 2018-10-19 ENCOUNTER — Ambulatory Visit
Admission: RE | Admit: 2018-10-19 | Discharge: 2018-10-19 | Disposition: A | Discharge status: Home / Self Care | Payer: 59 | Source: Ambulatory Visit | Attending: General Surgery | Admitting: General Surgery

## 2018-10-19 ENCOUNTER — Other Ambulatory Visit: Payer: Self-pay

## 2018-10-19 DIAGNOSIS — Z171 Estrogen receptor negative status [ER-]: Secondary | ICD-10-CM

## 2018-10-19 DIAGNOSIS — C50512 Malignant neoplasm of lower-outer quadrant of left female breast: Secondary | ICD-10-CM

## 2018-10-19 IMAGING — MG NEEDLE LOCALIZATION OF THE LEFT BREAST WITH MAMMO GUIDANCE
8 of 10 series · 8 of 10 positions shown · non-contrast
Comparison: Previous exam(s).

CLINICAL DATA: 48-year-old female for radioactive seed localization
of 2 separate areas of biopsy-proven DCIS, prior to lumpectomy.

EXAM:
MAMMOGRAPHIC GUIDED RADIOACTIVE SEED LOCALIZATION OF THE LEFT BREAST
X 2

[L CC (1 of 3)]
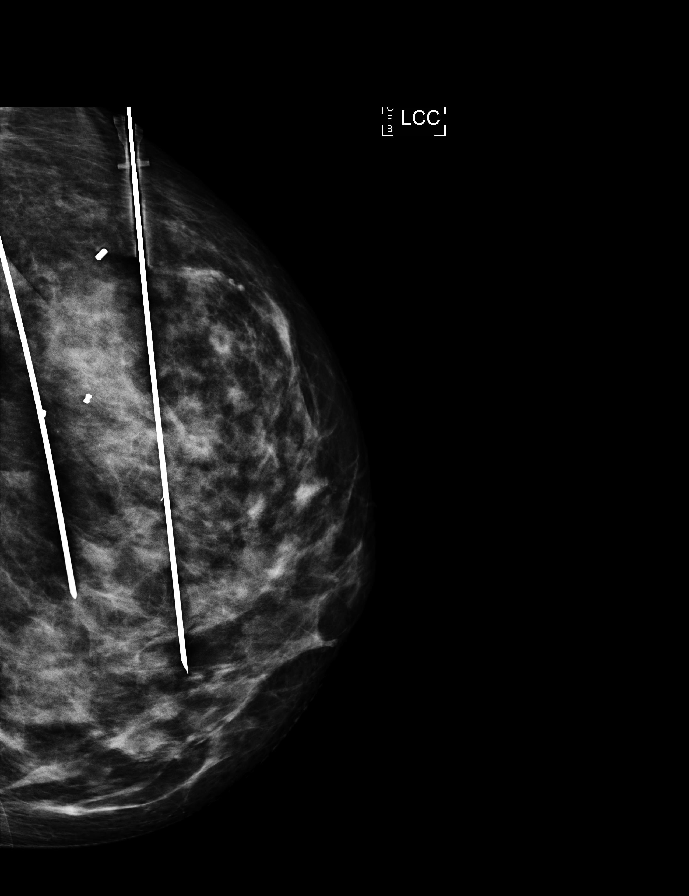

[L ML (1 of 2)]
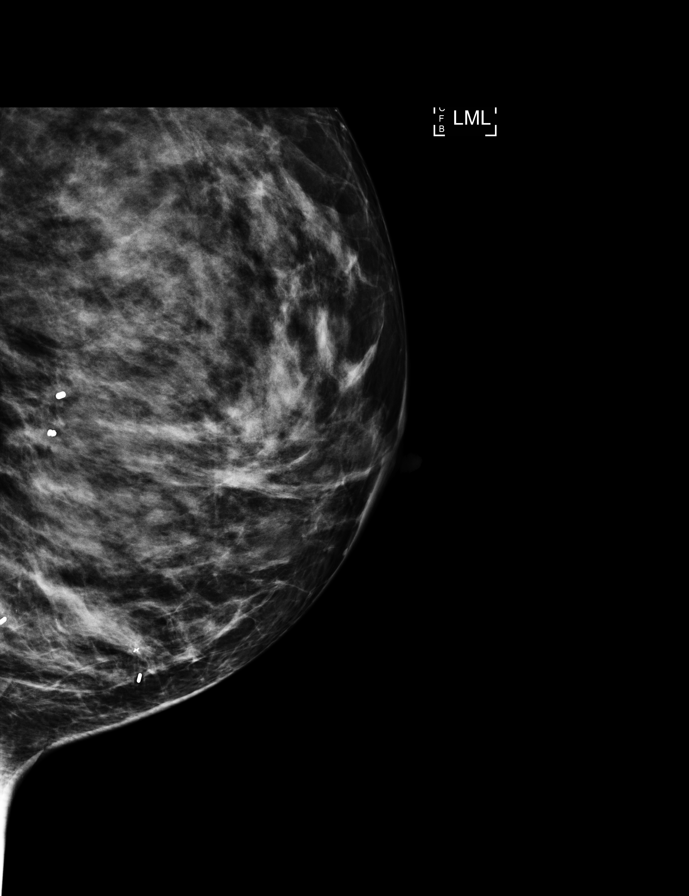

[L CC (2 of 3)]
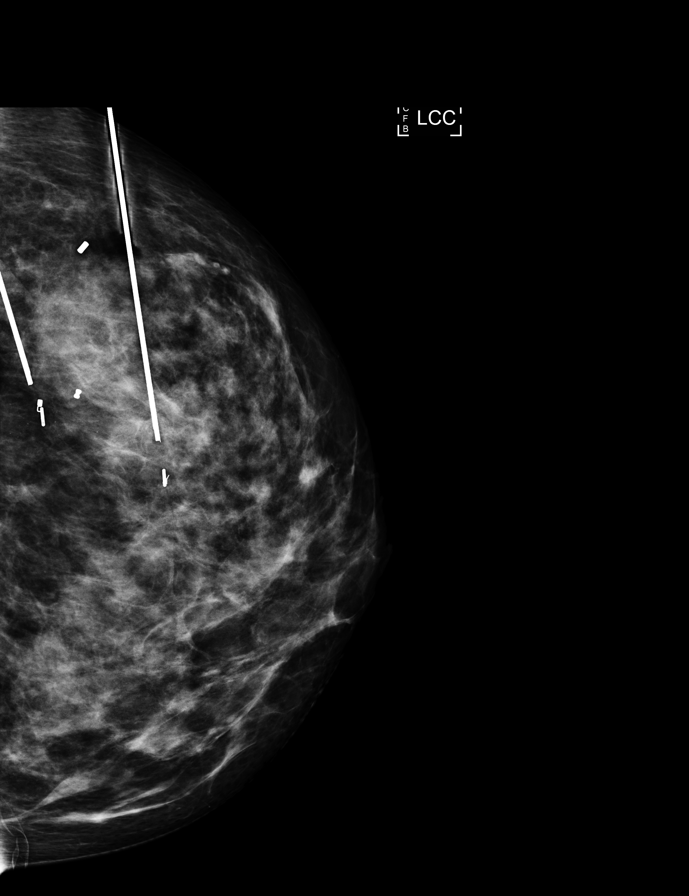

[L LM (1 of 3)]
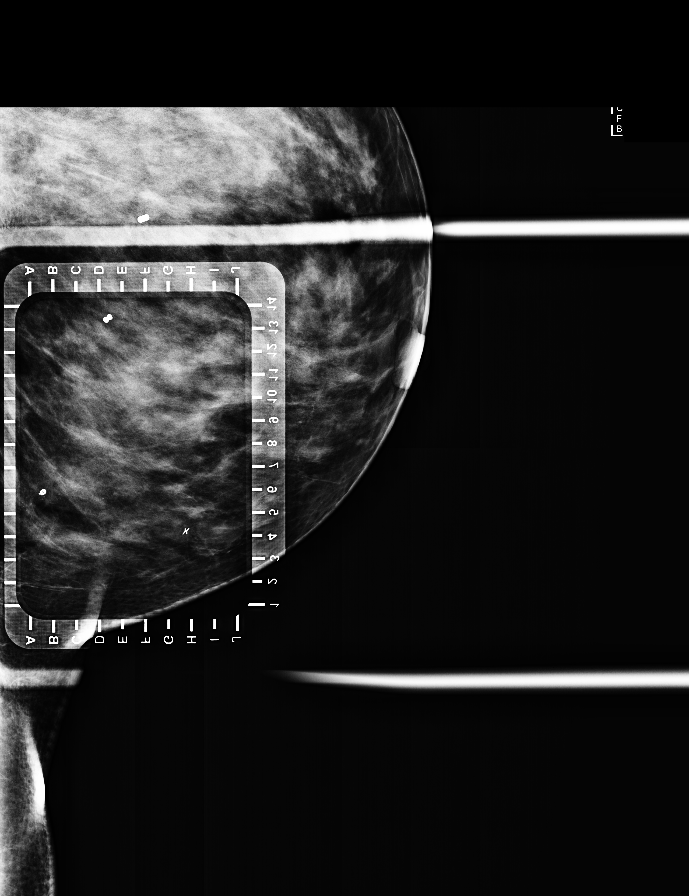

[L LM (2 of 3)]
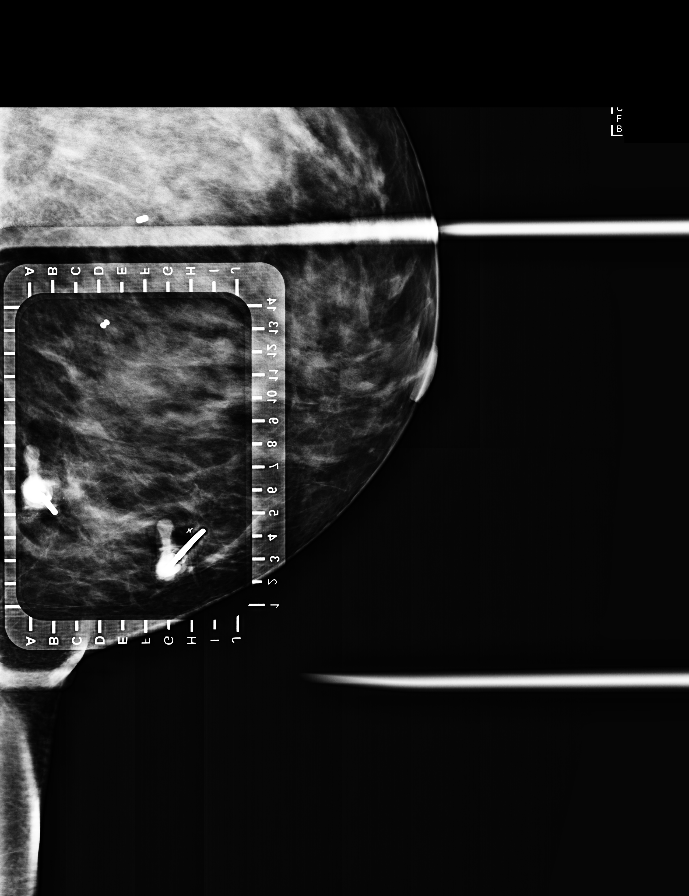

[L ML (2 of 2)]
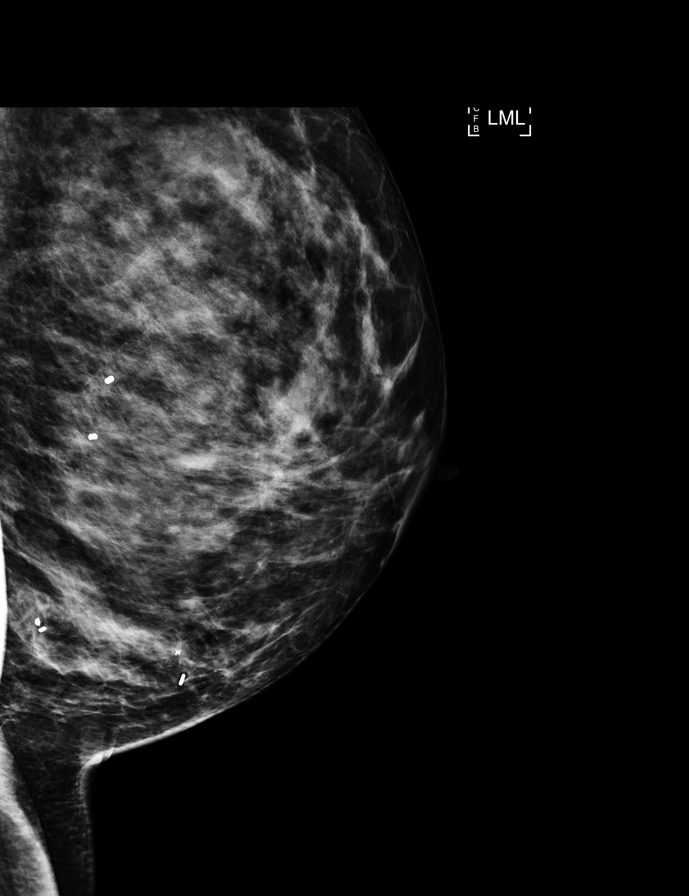

[L LM (3 of 3)]
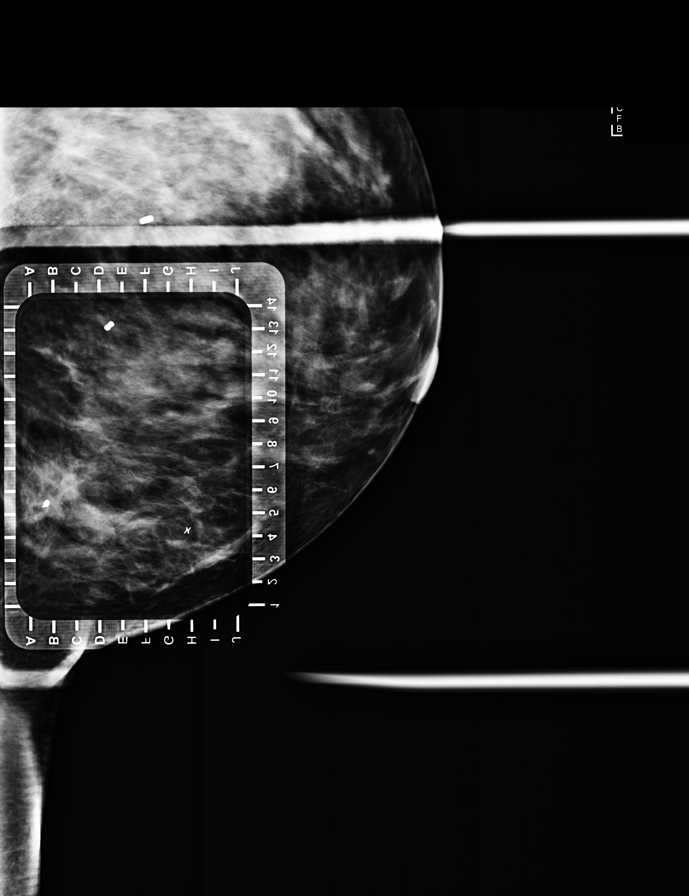

[L CC (3 of 3)]
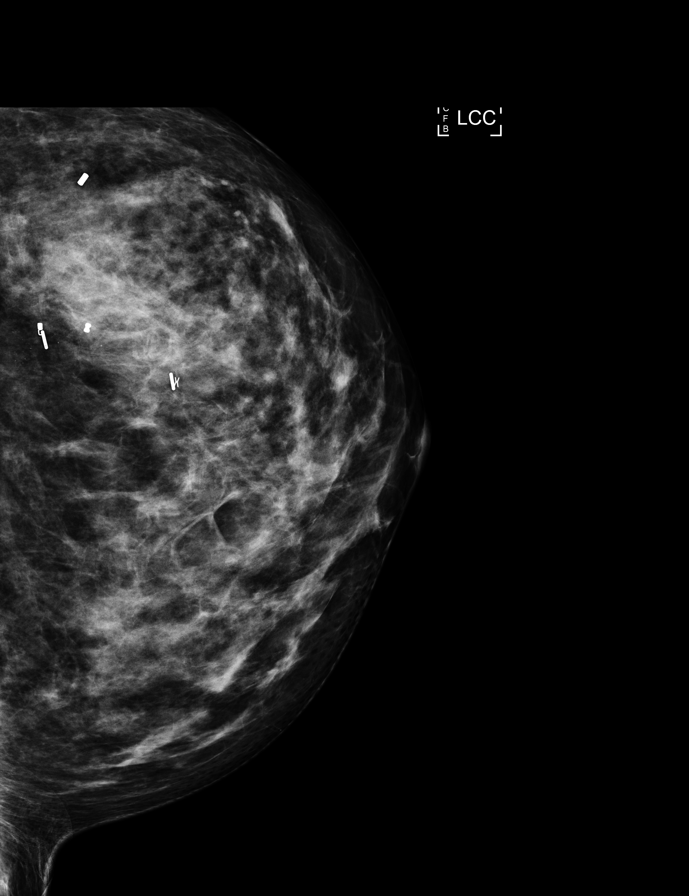

[8 of 10 positions shown; findings below may reference images not displayed]

FINDINGS: Patient presents for radioactive seed localization prior to
lumpectomy. I met with the patient and we discussed the procedure of
seed localizations including benefits and alternatives. We discussed
the high likelihood of successful procedures. We discussed the risks
of the procedures including infection, bleeding, tissue injury and
further surgery. We discussed the low dose of radioactivity involved
in the procedures. Informed, written consent was given.

The usual time-out protocol was performed immediately prior to the
procedures.

RADIOACTIVE SEED LOCALIZATION OF THE LEFT BREAST #1 (COIL clip):

Using mammographic guidance, sterile technique, 1% lidocaine and an
[4Z] radioactive seed, the COIL clip was localized using a LATERAL
approach. The follow-up mammogram images confirm the seed in the
expected location and were marked for Dr. SILWA.

Follow-up survey of the patient confirms presence of the radioactive
seed.

Order number of [4Z] seed:  [PHONE_NUMBER].

Total activity:  0.253 millicuries.  Reference Date: [DATE]

RADIOACTIVE SEED LOCALIZATION OF THE LEFT BREAST #2 (X clip):

Using mammographic guidance, sterile technique, 1% lidocaine and an
[4Z] radioactive seed, the X clip was localized using a LATERAL
approach. The follow-up mammogram images confirm the seed in the
expected location and were marked for Dr. SILWA.

Follow-up survey of the patient confirms presence of the radioactive
seed.

Order number of [4Z] seed:  [PHONE_NUMBER].

Total activity:  0.253 millicuries.  Reference Date: [DATE]

The patient tolerated the procedure well and was released from the
[REDACTED]. She was given instructions regarding seed removal.
IMPRESSION: Radioactive seed localizations of the LEFT breast. No apparent
complications.

## 2018-10-20 ENCOUNTER — Encounter (HOSPITAL_COMMUNITY)
Admission: RE | Disposition: A | Discharge status: Home / Self Care | Payer: Self-pay | Source: Home / Self Care | Attending: General Surgery

## 2018-10-20 ENCOUNTER — Ambulatory Visit (HOSPITAL_COMMUNITY)
Admission: RE | Admit: 2018-10-20 | Discharge: 2018-10-20 | Disposition: A | Discharge status: Home / Self Care | Payer: 59 | Attending: General Surgery | Admitting: General Surgery

## 2018-10-20 ENCOUNTER — Ambulatory Visit
Admission: RE | Admit: 2018-10-20 | Discharge: 2018-10-20 | Disposition: A | Discharge status: Home / Self Care | Payer: 59 | Source: Ambulatory Visit | Attending: General Surgery | Admitting: General Surgery

## 2018-10-20 ENCOUNTER — Ambulatory Visit (HOSPITAL_COMMUNITY): Payer: 59 | Admitting: Vascular Surgery

## 2018-10-20 ENCOUNTER — Ambulatory Visit (HOSPITAL_COMMUNITY): Payer: 59 | Admitting: Anesthesiology

## 2018-10-20 ENCOUNTER — Encounter (HOSPITAL_COMMUNITY): Payer: Self-pay | Admitting: *Deleted

## 2018-10-20 DIAGNOSIS — Z853 Personal history of malignant neoplasm of breast: Secondary | ICD-10-CM | POA: Insufficient documentation

## 2018-10-20 DIAGNOSIS — Z87891 Personal history of nicotine dependence: Secondary | ICD-10-CM | POA: Diagnosis not present

## 2018-10-20 DIAGNOSIS — N6489 Other specified disorders of breast: Secondary | ICD-10-CM | POA: Insufficient documentation

## 2018-10-20 DIAGNOSIS — Z803 Family history of malignant neoplasm of breast: Secondary | ICD-10-CM | POA: Diagnosis not present

## 2018-10-20 DIAGNOSIS — E119 Type 2 diabetes mellitus without complications: Secondary | ICD-10-CM | POA: Insufficient documentation

## 2018-10-20 DIAGNOSIS — D0512 Intraductal carcinoma in situ of left breast: Secondary | ICD-10-CM | POA: Insufficient documentation

## 2018-10-20 DIAGNOSIS — C50512 Malignant neoplasm of lower-outer quadrant of left female breast: Secondary | ICD-10-CM

## 2018-10-20 HISTORY — PX: BREAST LUMPECTOMY WITH RADIOACTIVE SEED LOCALIZATION: SHX6424

## 2018-10-20 LAB — POCT PREGNANCY, URINE: Preg Test, Ur: NEGATIVE

## 2018-10-20 IMAGING — MG BREAST SURGICAL SPECIMEN
1 series · 1 of 1 positions shown · non-contrast
Comparison: Previous exam(s).

CLINICAL DATA: Left lumpectomy for 2 sites of DCIS.

EXAM:
SPECIMEN RADIOGRAPH OF THE LEFT BREAST

[L]
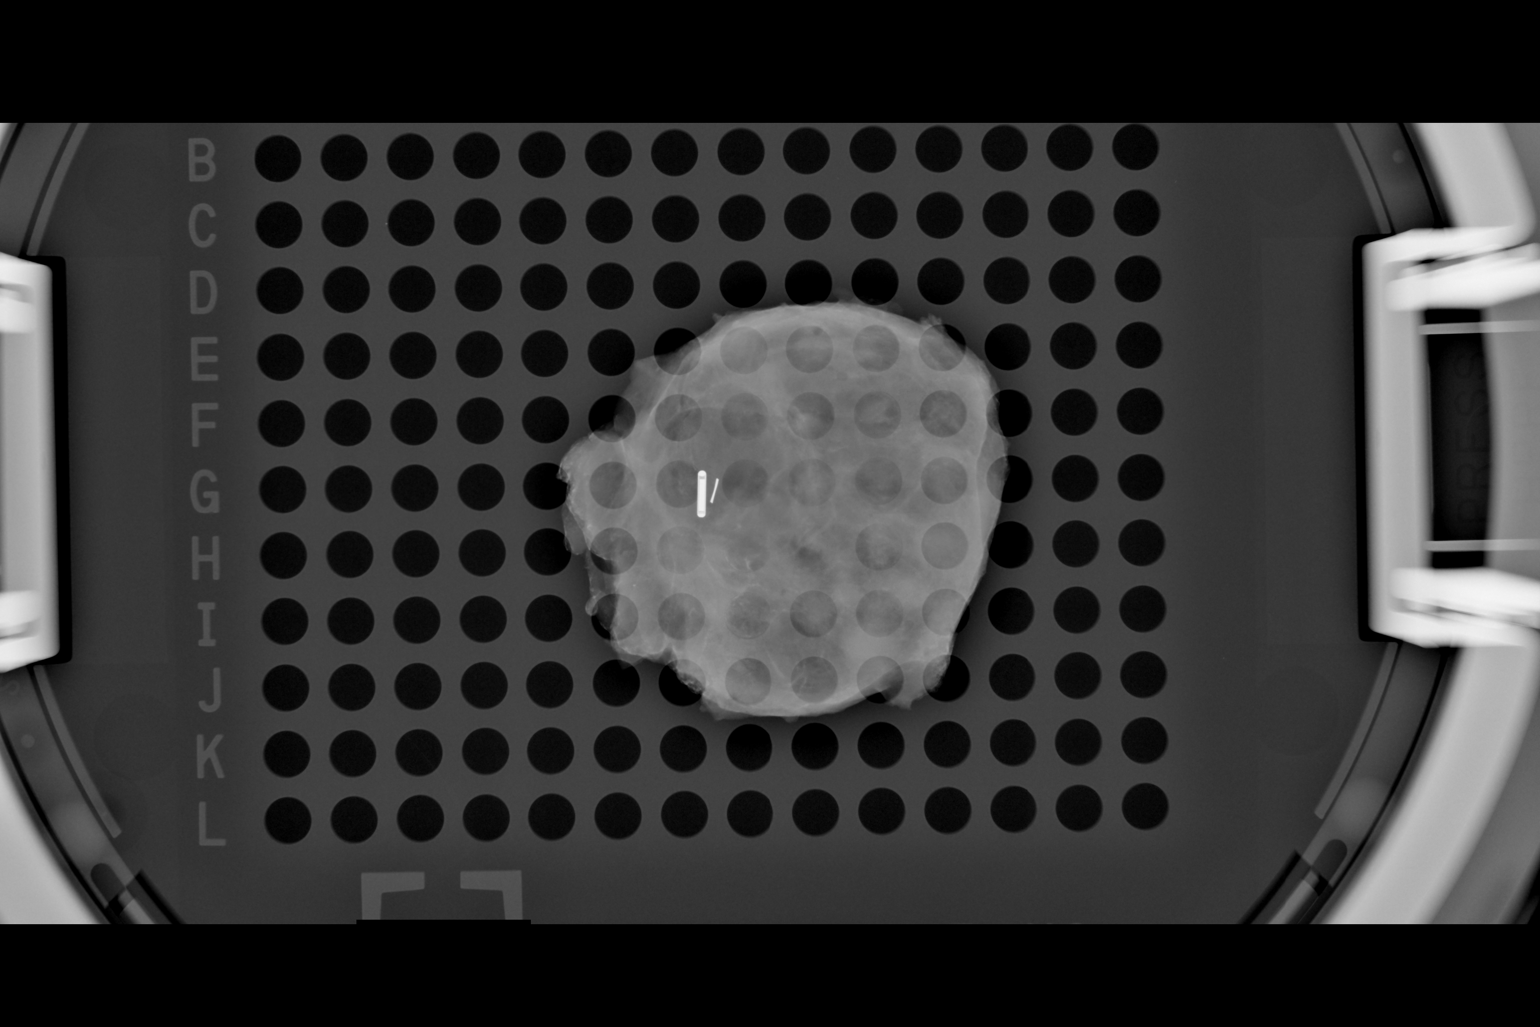

[1 of 1 positions shown; findings below may reference images not displayed]

FINDINGS: Status post excision of the left breast. The radioactive seed and X
shaped biopsy marker clip are present, completely intact, and were
marked for pathology.
IMPRESSION: Specimen radiograph of the left breast.

## 2018-10-20 IMAGING — MG BREAST SURGICAL SPECIMEN
1 series · 1 of 1 positions shown · non-contrast
Comparison: Previous exam(s).

CLINICAL DATA: Left lumpectomy for 2 separate areas of
biopsy-proven DCIS.

EXAM:
SPECIMEN RADIOGRAPH OF THE LEFT BREAST

[L]
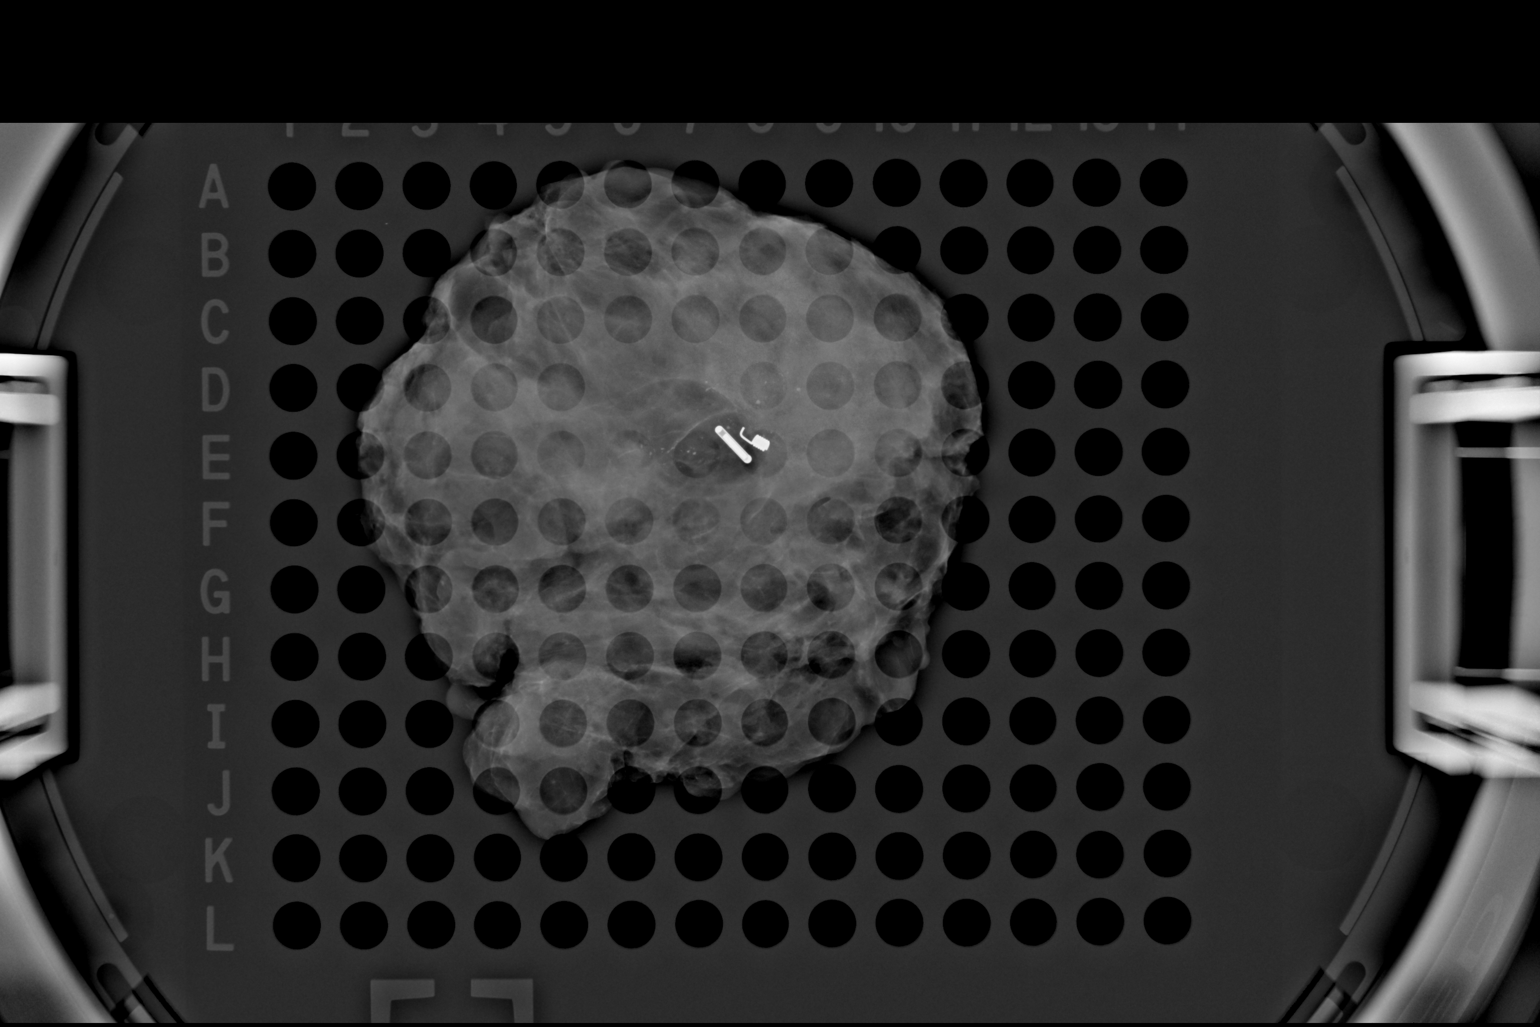

[1 of 1 positions shown; findings below may reference images not displayed]

FINDINGS: Status post excision of the left breast. The radioactive seed,
multiple calcifications and coil shaped biopsy marker clip are
present, completely intact, and were marked for pathology.
IMPRESSION: Specimen radiograph of the left breast.

## 2018-10-20 SURGERY — BREAST LUMPECTOMY WITH RADIOACTIVE SEED LOCALIZATION
Anesthesia: General | Site: Breast | Laterality: Left

## 2018-10-20 MED ORDER — CHLORHEXIDINE GLUCONATE CLOTH 2 % EX PADS: 6.0000 | MEDICATED_PAD | Freq: Once | CUTANEOUS | Status: DC

## 2018-10-20 MED ORDER — FENTANYL CITRATE (PF) 100 MCG/2ML IJ SOLN
INTRAMUSCULAR | Status: DC | PRN
  Administered 2018-10-20: 50 ug via INTRAVENOUS
  Administered 2018-10-20: 25 ug via INTRAVENOUS
  Administered 2018-10-20: 50 ug via INTRAVENOUS

## 2018-10-20 MED ORDER — OXYCODONE HCL 5 MG PO TABS: 2.5000 mg | ORAL_TABLET | ORAL | 0 refills | Status: DC | PRN

## 2018-10-20 MED ORDER — OXYCODONE HCL 5 MG PO TABS: 5.0000 mg | ORAL_TABLET | Freq: Once | ORAL | Status: DC | PRN

## 2018-10-20 MED ORDER — MIDAZOLAM HCL 2 MG/2ML IJ SOLN
INTRAMUSCULAR | Status: AC
  Filled 2018-10-20: qty 2

## 2018-10-20 MED ORDER — FENTANYL CITRATE (PF) 250 MCG/5ML IJ SOLN
INTRAMUSCULAR | Status: AC
  Filled 2018-10-20: qty 5

## 2018-10-20 MED ORDER — IBUPROFEN 600 MG PO TABS: 600.0000 mg | ORAL_TABLET | Freq: Four times a day (QID) | ORAL | 0 refills | Status: DC | PRN

## 2018-10-20 MED ORDER — MIDAZOLAM HCL 5 MG/5ML IJ SOLN
INTRAMUSCULAR | Status: DC | PRN
  Administered 2018-10-20: 2 mg via INTRAVENOUS

## 2018-10-20 MED ORDER — ONDANSETRON HCL 4 MG/2ML IJ SOLN
4.0000 mg | Freq: Once | INTRAMUSCULAR | Status: AC | PRN
  Administered 2018-10-20: 4 mg via INTRAVENOUS

## 2018-10-20 MED ORDER — FENTANYL CITRATE (PF) 100 MCG/2ML IJ SOLN: 25.0000 ug | INTRAMUSCULAR | Status: DC | PRN

## 2018-10-20 MED ORDER — DEXAMETHASONE SODIUM PHOSPHATE 4 MG/ML IJ SOLN
INTRAMUSCULAR | Status: DC | PRN
  Administered 2018-10-20: 10 mg via INTRAVENOUS

## 2018-10-20 MED ORDER — CELECOXIB 200 MG PO CAPS
200.0000 mg | ORAL_CAPSULE | ORAL | Status: AC
  Administered 2018-10-20: 06:00:00 200 mg via ORAL
  Filled 2018-10-20: qty 1

## 2018-10-20 MED ORDER — LACTATED RINGERS IV SOLN
INTRAVENOUS | Status: DC
  Administered 2018-10-20: 07:00:00 via INTRAVENOUS

## 2018-10-20 MED ORDER — CEFAZOLIN SODIUM-DEXTROSE 2-4 GM/100ML-% IV SOLN
2.0000 g | INTRAVENOUS | Status: AC
  Administered 2018-10-20: 2 g via INTRAVENOUS
  Filled 2018-10-20: qty 100

## 2018-10-20 MED ORDER — ONDANSETRON HCL 4 MG/2ML IJ SOLN
INTRAMUSCULAR | Status: AC
  Filled 2018-10-20: qty 2

## 2018-10-20 MED ORDER — ACETAMINOPHEN 500 MG PO TABS
1000.0000 mg | ORAL_TABLET | ORAL | Status: AC
  Administered 2018-10-20: 06:00:00 1000 mg via ORAL
  Filled 2018-10-20: qty 2

## 2018-10-20 MED ORDER — BUPIVACAINE-EPINEPHRINE (PF) 0.25% -1:200000 IJ SOLN
INTRAMUSCULAR | Status: AC
  Filled 2018-10-20: qty 30

## 2018-10-20 MED ORDER — 0.9 % SODIUM CHLORIDE (POUR BTL) OPTIME
TOPICAL | Status: DC | PRN
  Administered 2018-10-20: 1000 mL

## 2018-10-20 MED ORDER — STERILE WATER FOR IRRIGATION IR SOLN
Status: DC | PRN
  Administered 2018-10-20: 1000 mL

## 2018-10-20 MED ORDER — LIDOCAINE HCL (PF) 1 % IJ SOLN
INTRAMUSCULAR | Status: AC
  Filled 2018-10-20: qty 30

## 2018-10-20 MED ORDER — PROPOFOL 10 MG/ML IV BOLUS
INTRAVENOUS | Status: DC | PRN
  Administered 2018-10-20: 150 mg via INTRAVENOUS

## 2018-10-20 MED ORDER — LIDOCAINE 2% (20 MG/ML) 5 ML SYRINGE
INTRAMUSCULAR | Status: DC | PRN
  Administered 2018-10-20: 100 mg via INTRAVENOUS

## 2018-10-20 MED ORDER — OXYCODONE HCL 5 MG/5ML PO SOLN: 5.0000 mg | Freq: Once | ORAL | Status: DC | PRN

## 2018-10-20 MED ORDER — LIDOCAINE HCL 1 % IJ SOLN
INTRAMUSCULAR | Status: DC | PRN
  Administered 2018-10-20: 30 mL

## 2018-10-20 MED ORDER — GABAPENTIN 300 MG PO CAPS
300.0000 mg | ORAL_CAPSULE | ORAL | Status: AC
  Administered 2018-10-20: 06:00:00 300 mg via ORAL
  Filled 2018-10-20: qty 1

## 2018-10-20 MED ORDER — ONDANSETRON HCL 4 MG/2ML IJ SOLN
INTRAMUSCULAR | Status: DC | PRN
  Administered 2018-10-20: 4 mg via INTRAVENOUS

## 2018-10-20 MED ORDER — PROPOFOL 10 MG/ML IV BOLUS
INTRAVENOUS | Status: AC
  Filled 2018-10-20: qty 40

## 2018-10-20 SURGICAL SUPPLY — 46 items
BINDER BREAST XLRG (GAUZE/BANDAGES/DRESSINGS) ×3 IMPLANT
BLADE SURG 10 STRL SS (BLADE) ×3 IMPLANT
CANISTER SUCT 3000ML PPV (MISCELLANEOUS) ×3 IMPLANT
CHLORAPREP W/TINT 26 (MISCELLANEOUS) ×3 IMPLANT
CLIP VESOCCLUDE LG 6/CT (CLIP) ×3 IMPLANT
CLIP VESOCCLUDE MED 6/CT (CLIP) ×3 IMPLANT
CLOSURE WOUND 1/2 X4 (GAUZE/BANDAGES/DRESSINGS) ×1
COVER PROBE W GEL 5X96 (DRAPES) ×3 IMPLANT
COVER SURGICAL LIGHT HANDLE (MISCELLANEOUS) ×3 IMPLANT
COVER WAND RF STERILE (DRAPES) ×3 IMPLANT
DERMABOND ADVANCED (GAUZE/BANDAGES/DRESSINGS) ×2
DERMABOND ADVANCED .7 DNX12 (GAUZE/BANDAGES/DRESSINGS) ×1 IMPLANT
DEVICE DUBIN SPECIMEN MAMMOGRA (MISCELLANEOUS) ×6 IMPLANT
DRAPE CHEST BREAST 15X10 FENES (DRAPES) ×3 IMPLANT
DRSG PAD ABDOMINAL 8X10 ST (GAUZE/BANDAGES/DRESSINGS) ×3 IMPLANT
ELECT COATED BLADE 2.86 ST (ELECTRODE) ×3 IMPLANT
ELECT REM PT RETURN 9FT ADLT (ELECTROSURGICAL) ×3
ELECTRODE REM PT RTRN 9FT ADLT (ELECTROSURGICAL) ×1 IMPLANT
GAUZE SPONGE 4X4 12PLY STRL (GAUZE/BANDAGES/DRESSINGS) ×3 IMPLANT
GAUZE SPONGE 4X4 12PLY STRL LF (GAUZE/BANDAGES/DRESSINGS) ×3 IMPLANT
GLOVE BIO SURGEON STRL SZ 6 (GLOVE) ×3 IMPLANT
GLOVE BIOGEL PI IND STRL 6.5 (GLOVE) ×1 IMPLANT
GLOVE BIOGEL PI IND STRL 7.0 (GLOVE) ×1 IMPLANT
GLOVE BIOGEL PI INDICATOR 6.5 (GLOVE) ×2
GLOVE BIOGEL PI INDICATOR 7.0 (GLOVE) ×2
GLOVE INDICATOR 6.5 STRL GRN (GLOVE) ×3 IMPLANT
GLOVE SURG SS PI 6.0 STRL IVOR (GLOVE) ×3 IMPLANT
GLOVE SURG SS PI 7.0 STRL IVOR (GLOVE) ×3 IMPLANT
GOWN STRL REUS W/ TWL LRG LVL3 (GOWN DISPOSABLE) ×2 IMPLANT
GOWN STRL REUS W/TWL 2XL LVL3 (GOWN DISPOSABLE) ×3 IMPLANT
GOWN STRL REUS W/TWL LRG LVL3 (GOWN DISPOSABLE) ×4
KIT BASIN OR (CUSTOM PROCEDURE TRAY) ×3 IMPLANT
KIT MARKER MARGIN INK (KITS) ×3 IMPLANT
LIGHT WAVEGUIDE WIDE FLAT (MISCELLANEOUS) ×3 IMPLANT
NEEDLE HYPO 25GX1X1/2 BEV (NEEDLE) ×3 IMPLANT
NS IRRIG 1000ML POUR BTL (IV SOLUTION) ×3 IMPLANT
PACK GENERAL/GYN (CUSTOM PROCEDURE TRAY) ×3 IMPLANT
STRIP CLOSURE SKIN 1/2X4 (GAUZE/BANDAGES/DRESSINGS) ×2 IMPLANT
SUT MNCRL AB 4-0 PS2 18 (SUTURE) ×3 IMPLANT
SUT VIC AB 2-0 SH 27 (SUTURE) ×2
SUT VIC AB 2-0 SH 27XBRD (SUTURE) ×1 IMPLANT
SUT VIC AB 3-0 SH 27 (SUTURE) ×2
SUT VIC AB 3-0 SH 27X BRD (SUTURE) ×1 IMPLANT
SYR CONTROL 10ML LL (SYRINGE) ×3 IMPLANT
TOWEL GREEN STERILE (TOWEL DISPOSABLE) ×3 IMPLANT
TOWEL GREEN STERILE FF (TOWEL DISPOSABLE) ×3 IMPLANT

## 2018-10-20 NOTE — H&P (Signed)
Shelley Andrews Documented: 10/07/2018 10:10 AM Location: [facility] Patient #: [id number] DOB: 05/08/1970 Married / Language: Undefined / Race: Refused to Report/Unreported Female   History of Present Illness Stark Klein MD; 10/07/2018 10:46 AM) The patient is a 48 year old female who presents for a follow-up for Breast cancer. Patient is a 48 year old female referred for consultation by Dr. Derrel Nip for new diagnosis of left breast cancer. The patient had a tender area in the right axillary tail that felt like a mass. This led to diagnostic imaging. The right axillary tail region was found to be a lipoma. However, the patient had calcifications in the left breast, lower outer quadrant. Core needle biopsies were performed at the anterior and posterior extent of the calcifications. These were both intermediate grade DCIS with calcifications. Hormone receptor prognostic panel was negative. She is a G1P1 with menarche at age 89. She has several family members who have had breast cancer. Maternal aunt had breast cancer at unknown age.    Because of breast density, she underwent MRI and had 5 additional biopsies, all of which were benign and concordant. She returns to discuss surgery and to review images.   MR breast 09/14/2018 IMPRESSION: 1. The 2 sites of biopsy-proven DCIS in the lower outer quadrant of the left breast with intervening stippled non mass enhancement all together measures up to 4 cm.  2. There are 2 additional suspicious small enhancing masses located approximately 2.6 cm superior to the biopsy proven sites of disease in the left breast.  3. Indeterminate enhancing masses in upper central anterior right breast and lower outer quadrant of the right breast as well as linear oriented non mass enhancement in the lower outer quadrant of the right breast.   pathology 7/2 left breast Diagnosis 1. Breast, left, needle core biopsy, LOQ, posterior  (slightly inferior) - FIBROCYSTIC CHANGES. ADENOSIS. PSEUDOANGIOMATOUS STROMAL HYPERPLASIA ([place]). 2. Breast, left, needle core biopsy, LOQ, posterior (slightly superior) - FIBROCYSTIC CHANGES. PSEUDOANGIOMATOUS STROMAL HYPERPLASIA ([place]).  path 7/1 right breast Diagnosis 1. Breast, right, needle core biopsy, LOQ posterior mass barbell clip - FIBROCYSTIC CHANGES. - NO EVIDENCE OF MALIGNANCY. 2. Breast, right, needle core biopsy, LOQ non mass enhancement cylinder clip - FIBROCYSTIC CHANGES. - PSEUDOANGIOMATOUS STROMAL HYPERPLASIA ([place]). - FIBROADENOMA. - NO EVIDENCE OF MALIGNANCY. 3. Breast, right, needle core biopsy, anterior barbell clip - FIBROCYSTIC CHANGES. - PSEUDOANGIOMATOUS STROMAL HYPERPLASIA. - NO EVIDENCE OF MALIGNANCY.  dx mammo/us 08/30/18 COMPARISON: Outside mammogram August 29, 2016  ACR Breast Density Category c: The breast tissue is heterogeneously dense, which may obscure small masses.  FINDINGS: Metallic skin marker was placed in the region of patient concern in the right axillary tail. Spot compression view of this region shows normal predominately fatty tissue and more superiorly are normal appearing of right axillary lymph nodes. No mass, suspicious microcalcification, or architectural distortion is identified in the right breast.  No mass or distortion is identified on the left. Magnification views were performed of calcifications identified posteriorly in the inferior central/slightly outer left breast. There is a 0.3 cm group of coarse heterogeneous calcifications in the middle third of the breast parenchyma. Directly posterior to this is a larger group of heterogeneous calcifications, spanning 1.2 cm, and there are a few calcifications positioned in between the two discrete groups. These calcifications are in a ductal distribution and in total span 4 cm anterior to posterior diameter.  Mammographic images were processed with CAD.  On physical exam,  I palpate a smooth, oval mobile  mass in approximately 11 o'clock position right breast 11 cm from the nipple.  Targeted ultrasound is performed, showing a circumscribed oval fatty mass parallel to the chest wall at approximately 11 o'clock position 11 cm from the nipple. This mass measures 2.9 x 3.1 x 0.7 cm. This palpable mass has imaging features consistent with a benign lipoma.  IMPRESSION: Suspicious heterogeneous microcalcifications in a ductal distribution in the inferior central/slightly outer left breast for which ductal carcinoma in situ cannot be excluded.  3.1 cm benign palpable lipoma in the axillary tail of the right breast.  No evidence of malignancy in the right breast.  RECOMMENDATION: Two left breast stereotactic biopsies are recommended to sample the anterior and posterior extent of the suspicious microcalcifications. These biopsies are being scheduled for the patient.  I have discussed the findings and recommendations with the patient. Results were also provided in writing at the conclusion of the visit. If applicable, a reminder letter will be sent to the patient regarding the next appointment.  BI-RADS CATEGORY 4: Suspicious.  pathology 09/01/18 Diagnosis 1. Breast, left, needle core biopsy, lower central - DUCTAL CARCINOMA IN SITU - CALCIFICATIONS - SEE COMMENT 2. Breast, left, needle core biopsy, lower central - DUCTAL CARCINOMA IN SITU - CALCIFICATIONS - SEE COMMENT Microscopic Comment 1. Based on the biopsy, the ductal carcinoma in situ has a comedo pattern, nuclear grade and measures 0.2 cm in greatest linear extent. IMMUNOHISTOCHEMICAL AND MORPHOMETRIC ANALYSIS PERFORMED MANUALLY Estrogen Receptor: 0%, NEGATIVE Progesterone Receptor: 0%, NEGATIVE   Allergies Alean Rinne, RMA; 10/07/2018 10:11 AM) No Known Drug Allergies  [10/07/2018]: Allergies Reconciled   Medication History Alean Rinne, [place]; 10/07/2018 10:11 AM) Medications  Reconciled    Review of Systems Stark Klein MD; 10/07/2018 10:47 AM) All other systems negative  Vitals Alean Rinne RMA; 10/07/2018 10:11 AM) 10/07/2018 10:11 AM Weight: 123.8 lb Height: 60in Body Surface Area: 1.52 m Body Mass Index: 24.18 kg/m  Temp.: 29F  Pulse: 98 (Regular)  Resp.: 94 (Unlabored)  BP: 128/72(Sitting, Left Arm, Standard)       Physical Exam Stark Klein MD; 10/07/2018 10:47 AM) General Mental Status-Alert. General Appearance-Consistent with stated age. Hydration-Well hydrated. Voice-Normal.  Head and Neck Head-normocephalic, atraumatic with no lesions or palpable masses.  Eye Sclera/Conjunctiva - Bilateral-No scleral icterus.  Chest and Lung Exam Chest and lung exam reveals -quiet, even and easy respiratory effort with no use of accessory muscles. Inspection Chest Wall - Normal. Back - normal.  Breast Note: minimal bruising.   Cardiovascular Cardiovascular examination reveals -normal pedal pulses bilaterally. Note: regular rate and rhythm  Abdomen Inspection-Inspection Normal. Palpation/Percussion Palpation and Percussion of the abdomen reveal - Soft, Non Tender, No Rebound tenderness, No Rigidity (guarding) and No hepatosplenomegaly.  Peripheral Vascular Upper Extremity Inspection - Bilateral - Normal - No Clubbing, No Cyanosis, No Edema, Pulses Intact. Lower Extremity Palpation - Edema - Bilateral - No edema - Bilateral.  Neurologic Neurologic evaluation reveals -alert and oriented x 3 with no impairment of recent or remote memory. Mental Status-Normal.  Musculoskeletal Global Assessment -Note: no gross deformities.  Normal Exam - Left-Upper Extremity Strength Normal and Lower Extremity Strength Normal. Normal Exam - Right-Upper Extremity Strength Normal and Lower Extremity Strength Normal.  Lymphatic Head & Neck  General Head & Neck Lymphatics: Bilateral - Description -  Normal. Axillary  General Axillary Region: Bilateral - Description - Normal. Tenderness - Non Tender.    Assessment & Plan Stark Klein MD; 10/07/2018 10:44 AM)  MALIGNANT NEOPLASM OF LOWER-OUTER QUADRANT OF  LEFT BREAST OF FEMALE, ESTROGEN RECEPTOR NEGATIVE (C50.512) Impression: Pt had to get 5 additional biopsied and they were all negative.  Discussed with radiology and they are amenable to bracketed lumpectomy.  Reviewed films with patient and husband.  The surgical procedure was described to the patient. I discussed the incision type and location and that we would need radiology involved on with a wire or seed marker and/or sentinel node.  The risks and benefits of the procedure were described to the patient and she wishes to proceed.  We discussed the risks bleeding, infection, damage to other structures, need for further procedures/surgeries. We discussed the risk of seroma. The patient was advised if the area in the breast in cancer, we may need to go back to surgery for additional tissue to obtain negative margins or for a lymph node biopsy. The patient was advised that these are the most common complications, but that others can occur as well. They were advised against taking aspirin or other anti-inflammatory agents/blood thinners the week before surgery.  Current Plans You are being scheduled for surgery- Our schedulers will call you.  You should hear from our office's scheduling department within 5 working days about the location, date, and time of surgery. We try to make accommodations for patient's preferences in scheduling surgery, but sometimes the OR schedule or the surgeon's schedule prevents [place] from making those accommodations.  If you have not heard from our office [phone]) in 5 working days, call the office and ask for your surgeon's nurse.  If you have other questions about your diagnosis, plan, or surgery, call the office and ask for your surgeon's  nurse.  Pt Education - [facility] breast cancer surgery: discussed with patient and provided information.   Signed by Stark Klein, MD (10/07/2018 10:48 AM)

## 2018-10-20 NOTE — Transfer of Care (Signed)
Immediate Anesthesia Transfer of Care Note  Patient: Shelley Andrews  Procedure(s) Performed: LEFT BREAST LUMPECTOMY WITH BRACKETED RADIOACTIVE SEED LOCALIZATION (Left Breast)  Patient Location: PACU  Anesthesia Type:GA combined with regional for post-op pain  Level of Consciousness: awake, alert  and oriented  Airway & Oxygen Therapy: Patient Spontanous Breathing and Patient connected to face mask oxygen  Post-op Assessment: Report given to RN and Post -op Vital signs reviewed and stable  Post vital signs: Reviewed and stable  Last Vitals:  Vitals Value Taken Time  BP 121/75 10/20/18 0915  Temp    Pulse 114 10/20/18 0918  Resp 16 10/20/18 0918  SpO2 98 % 10/20/18 0918  Vitals shown include unvalidated device data.  Last Pain:  Vitals:   10/20/18 0617  TempSrc: Oral  PainSc:       Patients Stated Pain Goal: 4 (81/10/31 5945)  Complications: No apparent anesthesia complications

## 2018-10-20 NOTE — Anesthesia Procedure Notes (Signed)
Procedure Name: LMA Insertion Date/Time: 10/20/2018 7:39 AM Performed by: Candis Shine, CRNA Pre-anesthesia Checklist: Patient identified, Emergency Drugs available, Suction available and Patient being monitored Patient Re-evaluated:Patient Re-evaluated prior to induction Oxygen Delivery Method: Circle System Utilized Preoxygenation: Pre-oxygenation with 100% oxygen Induction Type: IV induction Ventilation: Mask ventilation without difficulty LMA: LMA inserted LMA Size: 4.0 Number of attempts: 1 Placement Confirmation: positive ETCO2 Tube secured with: Tape Dental Injury: Teeth and Oropharynx as per pre-operative assessment

## 2018-10-20 NOTE — Discharge Instructions (Addendum)
[facility] °Office Phone Number [phone] ° °BREAST BIOPSY/ PARTIAL MASTECTOMY: POST OP INSTRUCTIONS ° °Always review your discharge instruction sheet given to you by the facility where your surgery was performed. ° °IF YOU HAVE DISABILITY OR FAMILY LEAVE FORMS, YOU MUST BRING THEM TO THE OFFICE FOR PROCESSING.  DO NOT GIVE THEM TO YOUR DOCTOR. ° °1. A prescription for pain medication may be given to you upon discharge.  Take your pain medication as prescribed, if needed.  If narcotic pain medicine is not needed, then you may take acetaminophen (Tylenol) or ibuprofen (Advil) as needed. °2. Take your usually prescribed medications unless otherwise directed °3. If you need a refill on your pain medication, please contact your pharmacy.  They will contact our office to request authorization.  Prescriptions will not be filled after 5pm or on week-ends. °4. You should eat very light the first 24 hours after surgery, such as soup, crackers, pudding, etc.  Resume your normal diet the day after surgery. °5. Most patients will experience some swelling and bruising in the breast.  Ice packs and a good support bra will help.  Swelling and bruising can take several days to resolve.  °6. It is common to experience some constipation if taking pain medication after surgery.  Increasing fluid intake and taking a stool softener will usually help or prevent this problem from occurring.  A mild laxative (Milk of Magnesia or Miralax) should be taken according to package directions if there are no bowel movements after 48 hours. °7. Unless discharge instructions indicate otherwise, you may remove your bandages 48 hours after surgery, and you may shower at that time.  You may have steri-strips (small skin tapes) in place directly over the incision.  These strips should be left on the skin for 7-10 days.   Any sutures or staples will be removed at the office during your follow-up visit. °8. ACTIVITIES:  You may resume  regular daily activities (gradually increasing) beginning the next day.  Wearing a good support bra or sports bra (or the breast binder) minimizes pain and swelling.  You may have sexual intercourse when it is comfortable. °a. You may drive when you no longer are taking prescription pain medication, you can comfortably wear a seatbelt, and you can safely maneuver your car and apply brakes. °b. RETURN TO WORK:  __________1 week_______________ °9. You should see your doctor in the office for a follow-up appointment approximately two weeks after your surgery.  Your doctor’s nurse will typically make your follow-up appointment when she calls you with your pathology report.  Expect your pathology report 2-3 business days after your surgery.  You may call to check if you do not hear from us after three days. ° ° °WHEN TO CALL YOUR DOCTOR: °1. Fever over 101.0 °2. Nausea and/or vomiting. °3. Extreme swelling or bruising. °4. Continued bleeding from incision. °5. Increased pain, redness, or drainage from the incision. ° °The clinic staff is available to answer your questions during regular business hours.  Please don’t hesitate to call and ask to speak to one of the nurses for clinical concerns.  If you have a medical emergency, go to the nearest emergency room or call 911.  A surgeon from [facility] is always on call at the hospital. ° °For further questions, please visit [email or web address]  ° °

## 2018-10-20 NOTE — Op Note (Signed)
Left Breast Radioactive seed bracketed lumpectomy  Indications: This patient presents with history of left breast cancer, lower outer quadrant, cTis, hormone negative  Pre-operative Diagnosis: left breast cancer  Post-operative Diagnosis: Same  Surgeon: Stark Klein   Anesthesia: General endotracheal anesthesia  ASA Class: 2  Procedure Details  The patient was seen in the Holding Room. The risks, benefits, complications, treatment options, and expected outcomes were discussed with the patient. The possibilities of bleeding, infection, the need for additional procedures, failure to diagnose a condition, and creating a complication requiring transfusion or operation were discussed with the patient. The patient concurred with the proposed plan, giving informed consent.  The site of surgery properly noted/marked. The patient was taken to Operating Room # 9, identified, and the procedure verified as left Breast seed bracketed Lumpectomy. A Time Out was held and the above information confirmed.  The left breast and chest were prepped and draped in standard fashion. The lumpectomy was performed by creating an transverse incision over the inframammary fold near the previously placed radioactive seed.  Dissection was carried down to around the point of maximum signal intensity. The cautery was used to perform the dissection.  Hemostasis was achieved with cautery. It was apparent that one of the seeds was still present in the breast superiorly.  The superior margin was taken incorporating the additional seed.   The specimen was inked with the margin marker paint kit.    Specimen radiography confirmed inclusion of the mammographic lesion, the clip, and the seed. The second specimen had the second seed in it.  The edges of the cavity were marked with large clips, with one each medial, lateral, inferior and superior, and two clips posteriorly.  The background signal in the breast was zero.  The wound was  irrigated and closed with 3-0 vicryl in layers and 4-0 monocryl subcuticular suture.      Sterile dressings were applied. At the end of the operation, all sponge, instrument, and needle counts were correct.  Findings: grossly clear surgical margins and no adenopathy.  Posterior margin is pectoralis, anterior margin is skin.    Estimated Blood Loss:  min         Specimens: left breast lumpectomy with seed, superior margin with seed.           Complications:  None; patient tolerated the procedure well.         Disposition: PACU - hemodynamically stable.         Condition: stable

## 2018-10-20 NOTE — Anesthesia Procedure Notes (Signed)
Anesthesia Regional Block: Pectoralis block   Pre-Anesthetic Checklist: ,, timeout performed, Correct Patient, Correct Site, Correct Laterality, Correct Procedure, Correct Position, site marked, Risks and benefits discussed,  Surgical consent,  Pre-op evaluation,  At surgeon's request and post-op pain management  Laterality: Left  Prep: chloraprep       Needles:   Needle Type: Stimulator Needle - 80          Additional Needles:   Procedures:,,,, ultrasound used (permanent image in chart),,,,  Narrative:  Start time: 10/20/2018 7:10 AM End time: 10/20/2018 7:20 AM Injection made incrementally with aspirations every 5 mL.  Performed by: Personally  Anesthesiologist: Roberts Gaudy, MD  Additional Notes: 30 cc 0.25% Bupivacaine with 1:200 epi injected easily

## 2018-10-20 NOTE — Anesthesia Postprocedure Evaluation (Signed)
Anesthesia Post Note  Patient: Shelley Andrews  Procedure(s) Performed: LEFT BREAST LUMPECTOMY WITH BRACKETED RADIOACTIVE SEED LOCALIZATION (Left Breast)     Patient location during evaluation: PACU Anesthesia Type: General Level of consciousness: awake and alert Pain management: pain level controlled Vital Signs Assessment: post-procedure vital signs reviewed and stable Respiratory status: spontaneous breathing, nonlabored ventilation, respiratory function stable and patient connected to nasal cannula oxygen Cardiovascular status: blood pressure returned to baseline and stable Postop Assessment: no apparent nausea or vomiting Anesthetic complications: no    Last Vitals:  Vitals:   10/20/18 1000 10/20/18 1008  BP: 107/75 121/78  Pulse: 96 95  Resp: 17 16  Temp:  (!) 36.1 C  SpO2: 100% 100%    Last Pain:  Vitals:   10/20/18 1008  TempSrc:   PainSc: 1                  Maveryck Bahri COKER

## 2018-10-20 NOTE — Interval H&P Note (Signed)
History and Physical Interval Note:  10/20/2018 7:25 AM  Shelley Andrews  has presented today for surgery, with the diagnosis of left breast cancer.  The various methods of treatment have been discussed with the patient and family. After consideration of risks, benefits and other options for treatment, the patient has consented to  Procedure(s): LEFT BREAST LUMPECTOMY WITH BRACKETED RADIOACTIVE SEED LOCALIZATION (Left) as a surgical intervention.  The patient's history has been reviewed, patient examined, no change in status, stable for surgery.  I have reviewed the patient's chart and labs.  Questions were answered to the patient's satisfaction.     Stark Klein

## 2018-10-21 ENCOUNTER — Encounter (HOSPITAL_COMMUNITY): Payer: Self-pay | Admitting: General Surgery

## 2018-10-25 ENCOUNTER — Encounter: Payer: Self-pay | Admitting: *Deleted

## 2018-10-31 NOTE — Progress Notes (Signed)
Location of Breast Cancer: Left Breast  Histology per Pathology Report:  09/01/18 Diagnosis 1. Breast, left, needle core biopsy, lower central - DUCTAL CARCINOMA IN SITU - CALCIFICATIONS  Receptor Status: ER(NEG), PR (NEG)  2. Breast, left, needle core biopsy, lower central - DUCTAL CARCINOMA IN SITU - CALCIFICATIONS - SEE COMMENT  10/20/18 Diagnosis 1. Breast, lumpectomy, left with radioactive seed - DUCTAL CARCINOMA IN SITU - MARGINS UNINVOLVED BY CARCINOMA (LESS THAN 1MM ANTERIOR MARGIN, FOCAL) - COMPLEX SCLEROSING LESION WITH USUAL DUCTAL HYPERPLASIA - FIBROCYSTIC CHANGES - SEE ONCOLOGY TABLE AND COMMENT BELOW 2. Breast, lumpectomy, left superior margin with radioactive seed - FOCAL DUCTAL CARCINOMA IN SITU, INTERMEDIATE NUCLEAR GRADE - USUAL DUCTAL HYPERPLASIA AND FIBROCYSTIC CHANGES - PSEUDOANGIOMATOUS STROMAL HYPERPLASIA - BIOPSY SITE CHANGES PRESENT   Did patient present with symptoms or was this found on screening mammography?: She presented with a palpable mass in the right axillary tail but this area was determined to be a lipoma.  However left breast mammogram revealed suspicious calcifications in the central/outer left breast  Past/Anticipated interventions by surgeon, if any: 10/20/18 Dr. Norman Clay Left Breast Radioactive seed bracketed lumpectomy   Past/Anticipated interventions by medical oncology, if any:  09/07/18 Dr. Jana Hakim- [facility] (1) definitive surgery pending (2) adjuvant radiation as appropriate (3) genetics testing drawn 09/07/2018  Lymphedema issues, if any:  She denies. She has good arm mobility.   Pain issues, if any:  She denies.   SAFETY ISSUES:  Prior radiation? No  Pacemaker/ICD? No  Possible current pregnancy? She denies.   Is the patient on methotrexate? No  Current Complaints / other details:      Alisha Burgo, Stephani Police, RN 10/31/2018,8:31 AM

## 2018-11-03 NOTE — Progress Notes (Signed)
Radiation Oncology         [phone]) [phone] ________________________________  Name: Shelley Andrews MRN: [id number]  Date: 11/08/2018  DOB: 28-Dec-1970  Follow-Up New Visit Note by phone due to pandemic risks, couldn't access WebEx  Outpatient  CC: Rankins, Bill Salinas, MD  Magrinat, Virgie Dad, MD  Diagnosis:      ICD-10-CM   1. Ductal carcinoma in situ (DCIS) of left breast  D05.12 MM Digital Diagnostic Unilat L     Cancer Staging Ductal carcinoma in situ (DCIS) of left breast Staging form: Breast, AJCC 8th Edition - Clinical stage from 09/07/2018: Stage 0 (cTis (DCIS), cN0, cM0, ER-, PR-, HER2: Not Assessed) - Unsigned     - Pathologic Stage 0 (pTis, pNx) Left Breast Ductal Carcinoma In Situ, ER(-) / PR(-), Intermediate-High Grade  CHIEF COMPLAINT: Here to discuss management of left breast DCIS  Narrative:  The patient returns today for follow-up.   Since consultation date, she underwent the following imaging (dates and results as follows): Bilateral Breast MRI (09/14/2018): The 2 sites of biopsy-proven DCIS in the lower outer quadrant of the left breast with intervening stippled non mass enhancement all together measures up to 4 cm. There are 2 additional suspicious small enhancing masses located approximately 2.6 cm superior to the biopsy proven sites of disease in the left breast. Indeterminate enhancing masses in upper central anterior right breast and lower outer quadrant of the right breast as well as linear oriented non mass enhancement in the lower outer quadrant of the right breast.   Bilateral Breast Ultrasound (09/20/2018): No sonographic correlate for the 2 small masses in the RIGHT breast. No sonographic correlate for the non mass enhancement in the LOWER OUTER QUADRANT of the RIGHT breast. No sonographic correlate for the 2 small masses in the posterior LOWER OUTER QUADRANT of the LEFT breast.  Breast or nodal biopsies, since consultation, involved (dates and results as  follows): Right breast biopsies on 09/28/2018 revealed fibrocystic changes; no evidence of malignancy. Left breast biopsies on 09/29/2018 revealed fibrocystic changes and pseudoangiomatous stromal hyperplasia ([place]).  Left breast lumpectomy on date of 10/20/2018 revealed: Ductal carcinoma in situ measuring 0.8 cm. Margins uninvolved by carcinoma (less than 1 mm anterior margin, focal). Complex sclerosing lesion with usual ductal hyperplasia. Fibrocystic changes.  ER status: negative; PR status: negative; Intermediate to High Grade.  Symptomatically, the patient reports: doing well        ALLERGIES:  has No Known Allergies.  Meds: Current Outpatient Medications  Medication Sig Dispense Refill   ibuprofen (ADVIL) 600 MG tablet Take 1 tablet (600 mg total) by mouth every 6 (six) hours as needed. 30 tablet 0   oxyCODONE (OXY IR/ROXICODONE) 5 MG immediate release tablet Take 0.5-1 tablets (2.5-5 mg total) by mouth every 4 (four) hours as needed for moderate pain or severe pain. (Patient not taking: Reported on 11/08/2018) 15 tablet 0   No current facility-administered medications for this encounter.     Physical Findings:  vitals were not taken for this visit. .     General: Alert and oriented, in no acute distress   Lab Findings: Lab Results  Component Value Date   WBC 6.8 10/14/2018   HGB 12.9 10/14/2018   HCT 39.8 10/14/2018   MCV 93.4 10/14/2018   PLT 326 10/14/2018    @LASTCHEMISTRY @  Radiographic Findings: Mm Breast Surgical Specimen  Result Date: 10/20/2018 CLINICAL DATA:  Left lumpectomy for 2 sites of DCIS. EXAM: SPECIMEN RADIOGRAPH OF THE LEFT BREAST COMPARISON:  Previous  exam(s). FINDINGS: Status post excision of the left breast. The radioactive seed and X shaped biopsy marker clip are present, completely intact, and were marked for pathology. IMPRESSION: Specimen radiograph of the left breast. Electronically Signed   By: Claudie Revering M.D.   On: 10/20/2018 08:35   Mm Breast  Surgical Specimen  Result Date: 10/20/2018 CLINICAL DATA:  Left lumpectomy for 2 separate areas of biopsy-proven DCIS. EXAM: SPECIMEN RADIOGRAPH OF THE LEFT BREAST COMPARISON:  Previous exam(s). FINDINGS: Status post excision of the left breast. The radioactive seed, multiple calcifications and coil shaped biopsy marker clip are present, completely intact, and were marked for pathology. IMPRESSION: Specimen radiograph of the left breast. Electronically Signed   By: Claudie Revering M.D.   On: 10/20/2018 08:29   Mm Lt Radioactive Seed Loc Mammo Guide  Result Date: 10/19/2018 CLINICAL DATA:  48 year old female for radioactive seed localization of 2 separate areas of biopsy-proven DCIS, prior to lumpectomy. EXAM: MAMMOGRAPHIC GUIDED RADIOACTIVE SEED LOCALIZATION OF THE LEFT BREAST X 2 COMPARISON:  Previous exam(s). FINDINGS: Patient presents for radioactive seed localization prior to lumpectomy. I met with the patient and we discussed the procedure of seed localizations including benefits and alternatives. We discussed the high likelihood of successful procedures. We discussed the risks of the procedures including infection, bleeding, tissue injury and further surgery. We discussed the low dose of radioactivity involved in the procedures. Informed, written consent was given. The usual time-out protocol was performed immediately prior to the procedures. RADIOACTIVE SEED LOCALIZATION OF THE LEFT BREAST #1 (COIL clip): Using mammographic guidance, sterile technique, 1% lidocaine and an I-125 radioactive seed, the COIL clip was localized using a LATERAL approach. The follow-up mammogram images confirm the seed in the expected location and were marked for Dr. Barry Dienes. Follow-up survey of the patient confirms presence of the radioactive seed. Order number of I-125 seed:  [id number]. Total activity:  2.841 millicuries.  Reference Date: 09/20/2018 RADIOACTIVE SEED LOCALIZATION OF THE LEFT BREAST #2 (X clip): Using mammographic  guidance, sterile technique, 1% lidocaine and an I-125 radioactive seed, the X clip was localized using a LATERAL approach. The follow-up mammogram images confirm the seed in the expected location and were marked for Dr. Barry Dienes. Follow-up survey of the patient confirms presence of the radioactive seed. Order number of I-125 seed:  [id number]. Total activity:  2.536 millicuries.  Reference Date: 09/20/2018 The patient tolerated the procedure well and was released from the [place]. She was given instructions regarding seed removal. IMPRESSION: Radioactive seed localizations of the LEFT breast. No apparent complications. Electronically Signed   By: Margarette Canada M.D.   On: 10/19/2018 12:11   Mm Lt Rad Seed Ea Add Lesion Loc Mammo  Result Date: 10/19/2018 CLINICAL DATA:  48 year old female for radioactive seed localization of 2 separate areas of biopsy-proven DCIS, prior to lumpectomy. EXAM: MAMMOGRAPHIC GUIDED RADIOACTIVE SEED LOCALIZATION OF THE LEFT BREAST X 2 COMPARISON:  Previous exam(s). FINDINGS: Patient presents for radioactive seed localization prior to lumpectomy. I met with the patient and we discussed the procedure of seed localizations including benefits and alternatives. We discussed the high likelihood of successful procedures. We discussed the risks of the procedures including infection, bleeding, tissue injury and further surgery. We discussed the low dose of radioactivity involved in the procedures. Informed, written consent was given. The usual time-out protocol was performed immediately prior to the procedures. RADIOACTIVE SEED LOCALIZATION OF THE LEFT BREAST #1 (COIL clip): Using mammographic guidance, sterile technique, 1% lidocaine and an I-125 radioactive  seed, the COIL clip was localized using a LATERAL approach. The follow-up mammogram images confirm the seed in the expected location and were marked for Dr. Barry Dienes. Follow-up survey of the patient confirms presence of the radioactive  seed. Order number of I-125 seed:  [id number]. Total activity:  8.756 millicuries.  Reference Date: 09/20/2018 RADIOACTIVE SEED LOCALIZATION OF THE LEFT BREAST #2 (X clip): Using mammographic guidance, sterile technique, 1% lidocaine and an I-125 radioactive seed, the X clip was localized using a LATERAL approach. The follow-up mammogram images confirm the seed in the expected location and were marked for Dr. Barry Dienes. Follow-up survey of the patient confirms presence of the radioactive seed. Order number of I-125 seed:  [id number]. Total activity:  4.166 millicuries.  Reference Date: 09/20/2018 The patient tolerated the procedure well and was released from the [place]. She was given instructions regarding seed removal. IMPRESSION: Radioactive seed localizations of the LEFT breast. No apparent complications. Electronically Signed   By: Margarette Canada M.D.   On: 10/19/2018 12:11    Impression/Plan: Left Breast DCIS We discussed adjuvant radiotherapy today.  I recommend radiotherapy to the left breast in order to reduce risk of local recurrence by half.  The risks, benefits and side effects of this treatment were discussed in detail.  She understands that radiotherapy is associated with skin irritation and fatigue in the acute setting. Late effects can include cosmetic changes and rare injury to internal organs.   She is enthusiastic about proceeding with treatment.   A total of 3 medically necessary complex treatment devices will be fabricated and supervised by me: 2 fields with MLCs for custom blocks to protect heart, and lungs;  and, a Vac-lok. MORE COMPLEX DEVICES MAY BE MADE IN DOSIMETRY FOR FIELD IN FIELD BEAMS FOR DOSE HOMOGENEITY.  I have requested : 3D Simulation which is medically necessary to give adequate dose to at risk tissues while sparing lungs and heart.  I have requested a DVH of the following structures: lungs, heart, left lumpectomy cavity.    The patient will receive 40.05 Gy in 15  fractions to the left breast with 2 fields.  This will be followed by a boost.  I spoke w/ radiology and we concur a mammogram is prudent before simulation to r/o suspicious residual calcifications. Ordered.  This encounter was provided by telemedicine platform phone as she couldn't get on Webex.  The patient has given verbal consent for this type of encounter and has been advised to only accept a meeting of this type in a secure network environment. The time spent during this encounter was 15 minutes. The attendants for this meeting include Eppie Gibson  and Margarita Rana.  During the encounter, Eppie Gibson was located at [facility] Radiation Oncology Department.  Michelena Lozon was located at home.    _____________________________________   Eppie Gibson, MD  This document serves as a record of services personally performed by Eppie Gibson, MD. It was created on her behalf by Rae Lips, a trained medical scribe. The creation of this record is based on the scribe's personal observations and the provider's statements to them. This document has been checked and approved by the attending provider.

## 2018-11-08 ENCOUNTER — Ambulatory Visit
Admission: RE | Admit: 2018-11-08 | Discharge: 2018-11-08 | Disposition: A | Discharge status: Home / Self Care | Payer: 59 | Source: Ambulatory Visit | Attending: Radiation Oncology | Admitting: Radiation Oncology

## 2018-11-08 ENCOUNTER — Encounter: Payer: Self-pay | Admitting: Radiation Oncology

## 2018-11-08 ENCOUNTER — Other Ambulatory Visit: Payer: Self-pay

## 2018-11-08 DIAGNOSIS — C50812 Malignant neoplasm of overlapping sites of left female breast: Secondary | ICD-10-CM

## 2018-11-08 DIAGNOSIS — D0512 Intraductal carcinoma in situ of left breast: Secondary | ICD-10-CM

## 2018-11-16 ENCOUNTER — Other Ambulatory Visit: Payer: Self-pay

## 2018-11-16 ENCOUNTER — Inpatient Hospital Stay: Payer: 59 | Attending: Oncology | Admitting: Oncology

## 2018-11-16 VITALS — BP 103/59 | HR 86 | Temp 98.3°F | Resp 18 | Ht 61.0 in | Wt 126.5 lb

## 2018-11-16 DIAGNOSIS — D0512 Intraductal carcinoma in situ of left breast: Secondary | ICD-10-CM | POA: Diagnosis present
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
## 2018-11-18 ENCOUNTER — Telehealth: Payer: Self-pay | Admitting: Adult Health

## 2018-11-18 NOTE — Telephone Encounter (Signed)
I talk with patient regarding schedule  

## 2018-11-28 ENCOUNTER — Ambulatory Visit
Admission: RE | Admit: 2018-11-28 | Discharge: 2018-11-28 | Disposition: A | Discharge status: Home / Self Care | Payer: 59 | Source: Ambulatory Visit | Attending: Radiation Oncology | Admitting: Radiation Oncology

## 2018-11-28 ENCOUNTER — Other Ambulatory Visit: Payer: Self-pay | Admitting: Radiation Oncology

## 2018-11-28 ENCOUNTER — Other Ambulatory Visit: Payer: Self-pay

## 2018-11-28 DIAGNOSIS — D0512 Intraductal carcinoma in situ of left breast: Secondary | ICD-10-CM

## 2018-11-28 IMAGING — [facility] DIGITAL DIAGNOSTIC UNILATERAL LEFT MAMMOGRAM WITH TOMO AND CAD
7 series · 8 of 15 positions shown · non-contrast
Comparison: Previous exam(s).

CLINICAL DATA: Patient with history of left breast DCIS status post
lumpectomy with negative margins. Patient also had 2 benign left MRI
guided core needle biopsies with residual clips within the left
breast. Patient presents today for evaluation of residual
calcifications within the left breast prior to left breast
lumpectomy.

EXAM:
DIGITAL DIAGNOSTIC UNILATERAL LEFT MAMMOGRAM WITH CAD AND TOMO

[L MLO (1 of 2)]
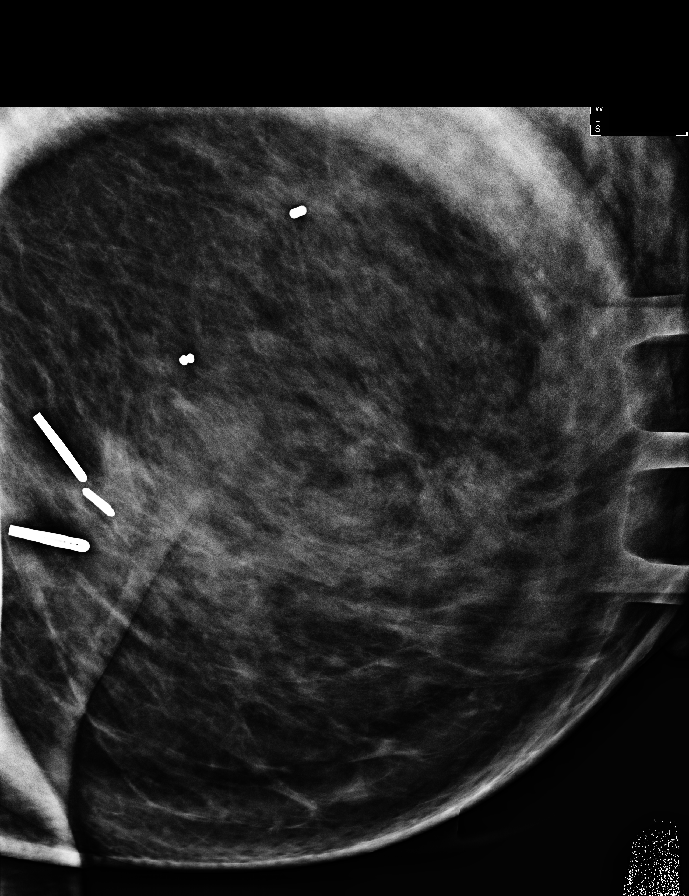

[L CC]
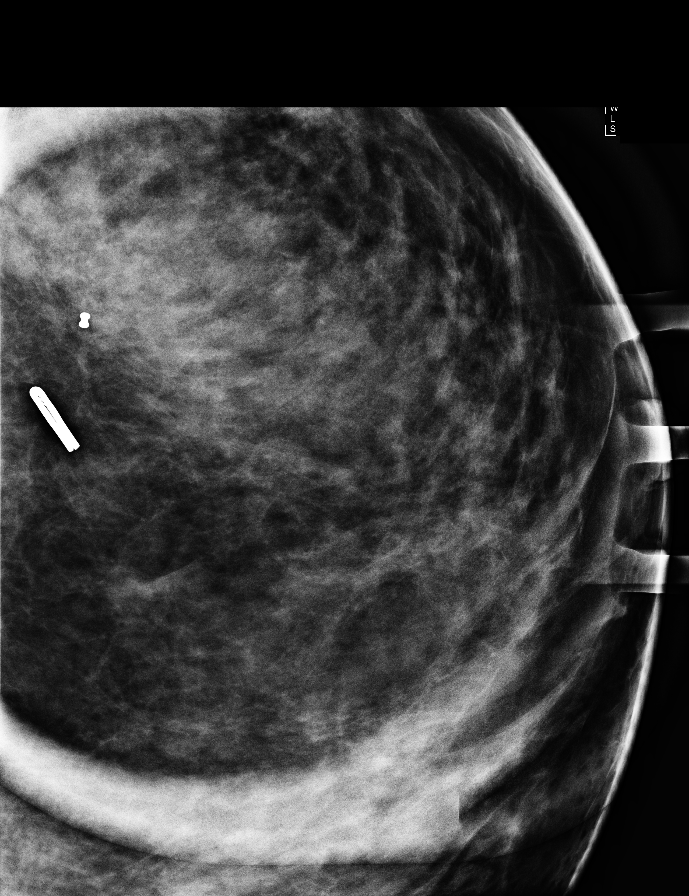

[L MLO (2 of 2)]
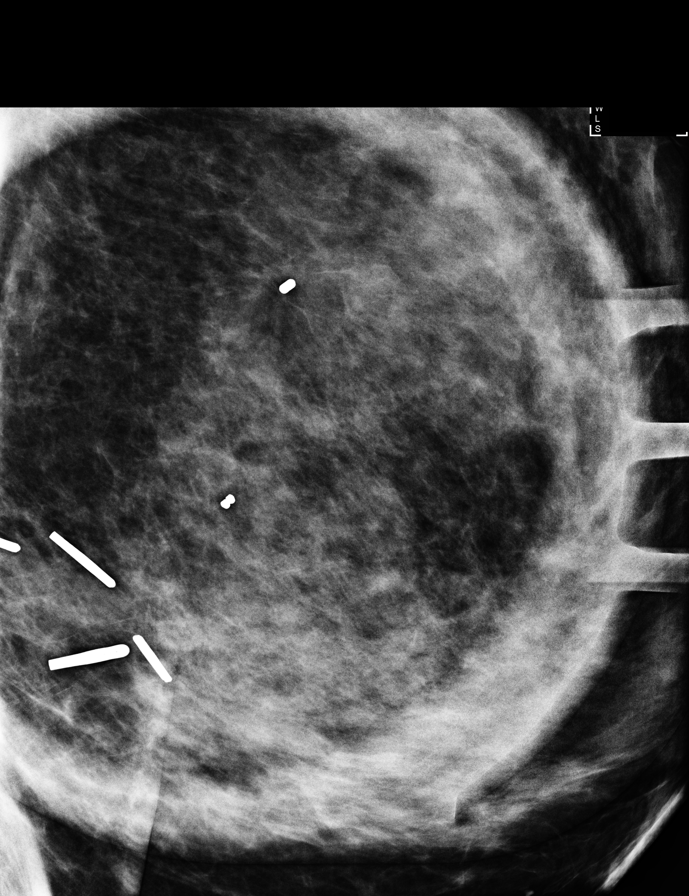

[L CC synth-2D]
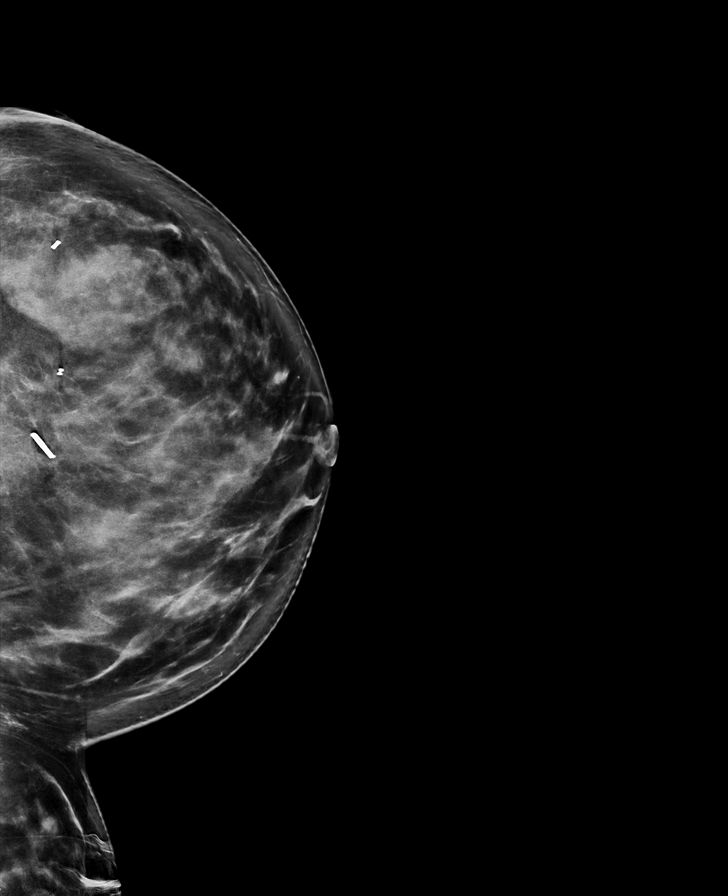

[L MLO synth-2D]
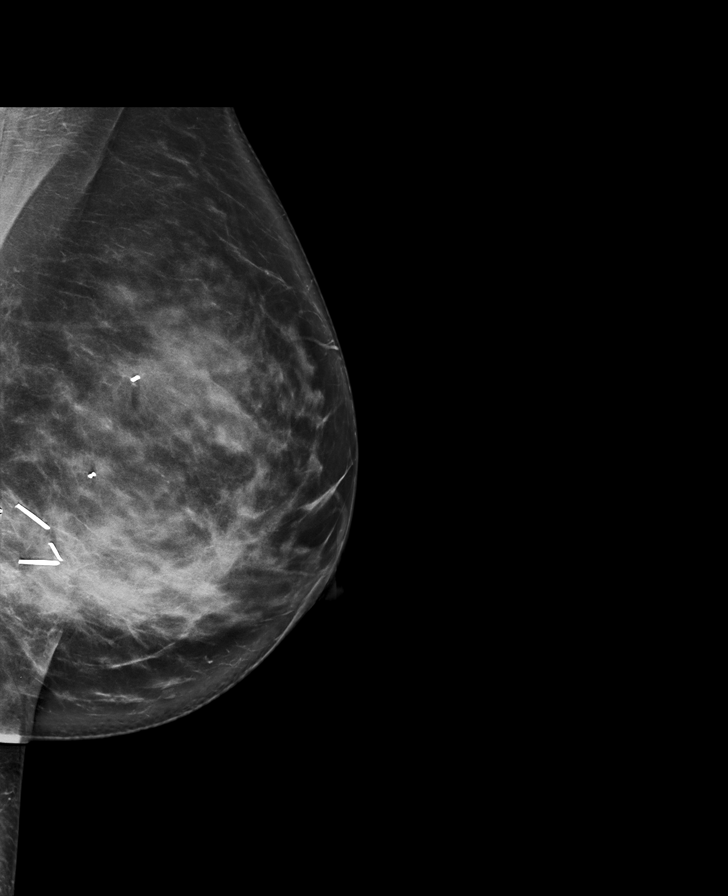

[L CC tomo · 2 of 98 frames shown]
[frame 32/98]
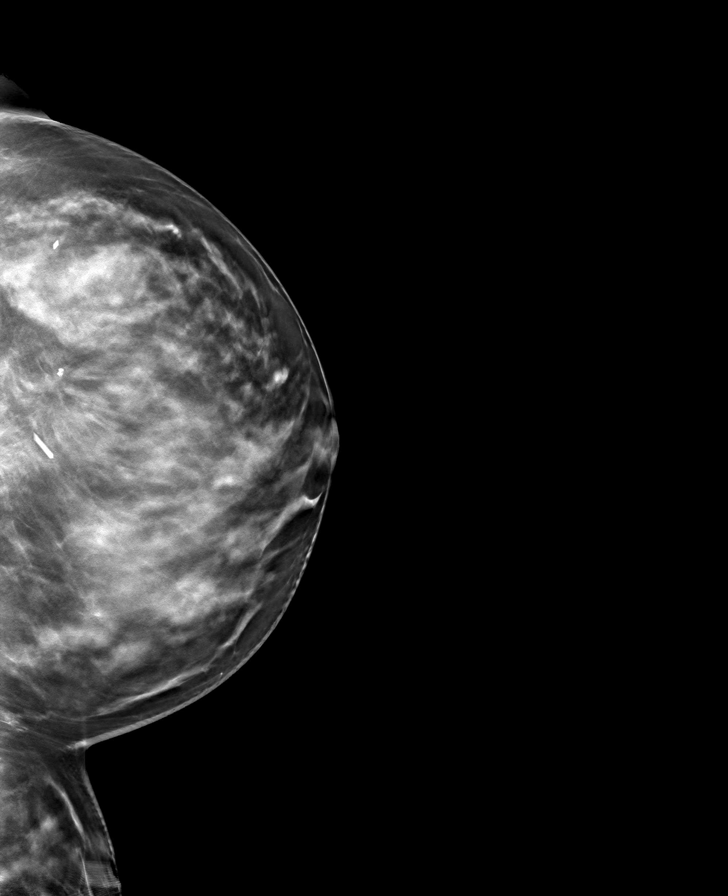
[frame 49/98]
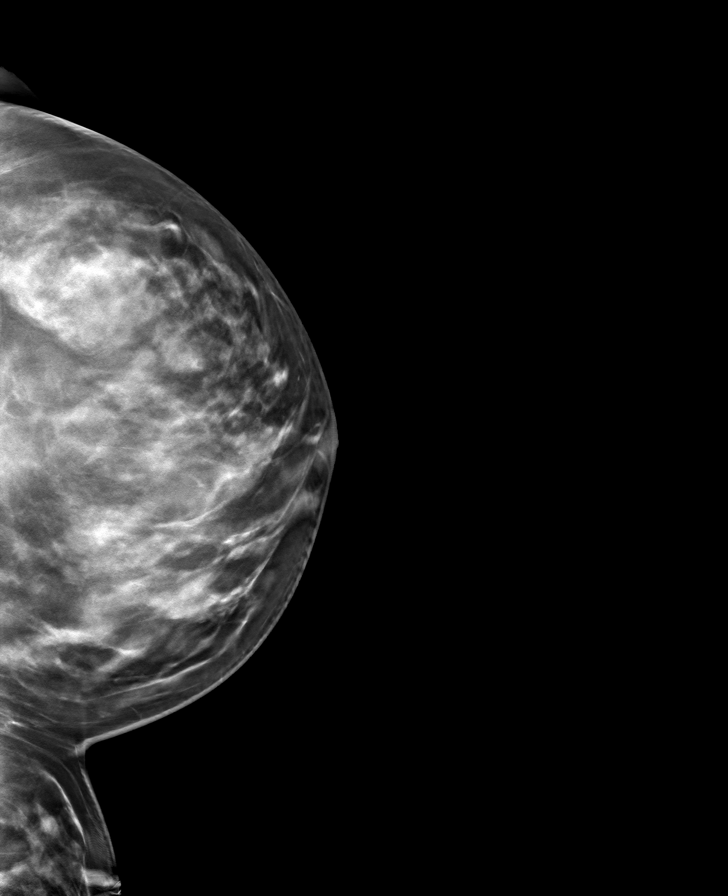

[L MLO tomo · tomo slice 47/94.0]
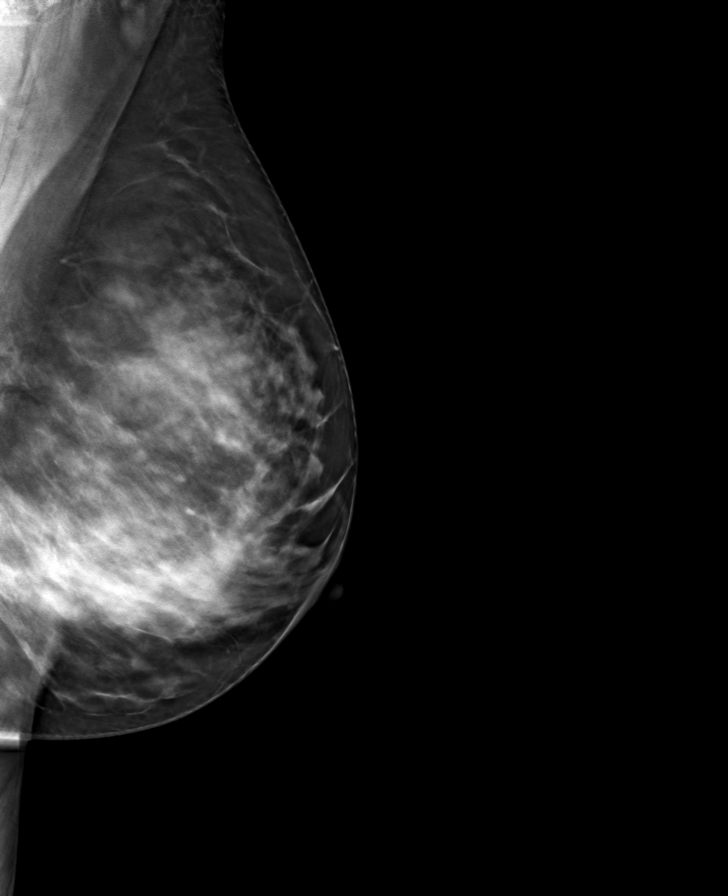

[8 of 15 positions shown; findings below may reference images not displayed]

ACR Breast Density Category c: The breast tissue is heterogeneously
dense, which may obscure small masses.
FINDINGS: Interval postlumpectomy changes left breast. No residual
calcifications are identified on magnification CC and MLO views.
Residual biopsy marking clips from prior benign MRI guided core
needle biopsies are demonstrated within the outer left breast. No
new masses, calcifications or nonsurgical distortion identified
within the left breast.

Mammographic images were processed with CAD.
IMPRESSION: No residual calcifications identified within the left breast status
post lumpectomy.

RECOMMENDATION:
Continue with treatment planning.

Annual diagnostic mammography [DATE].

I have discussed the findings and recommendations with the patient.
Results were also provided in writing at the conclusion of the
visit. If applicable, a reminder letter will be sent to the patient
regarding the next appointment.

BI-RADS CATEGORY  2: Benign.

## 2018-11-29 ENCOUNTER — Ambulatory Visit
Admission: RE | Admit: 2018-11-29 | Discharge: 2018-11-29 | Disposition: A | Discharge status: Home / Self Care | Payer: 59 | Source: Ambulatory Visit | Attending: Radiation Oncology | Admitting: Radiation Oncology

## 2018-11-29 ENCOUNTER — Encounter: Payer: Self-pay | Admitting: *Deleted

## 2018-11-29 ENCOUNTER — Other Ambulatory Visit: Payer: Self-pay

## 2018-11-29 DIAGNOSIS — D0512 Intraductal carcinoma in situ of left breast: Secondary | ICD-10-CM

## 2018-11-29 DIAGNOSIS — Z510 Encounter for antineoplastic radiation therapy: Secondary | ICD-10-CM | POA: Diagnosis not present

## 2018-11-29 DIAGNOSIS — Z170 Estrogen receptor positive status [ER+]: Secondary | ICD-10-CM | POA: Diagnosis not present

## 2018-11-29 NOTE — Progress Notes (Signed)
Radiation Oncology         [phone]) [phone] ________________________________  Name: Shelley Andrews MRN: [id number]  Date: 11/29/2018  DOB: 04-Oct-1970  SIMULATION AND TREATMENT PLANNING NOTE // Special treatment procedure   Outpatient  DIAGNOSIS:     ICD-10-CM   1. Ductal carcinoma in situ (DCIS) of left breast  D05.12     NARRATIVE:  The patient was brought to the [facility].  Identity was confirmed.  All relevant records and images related to the planned course of therapy were reviewed.  The patient freely provided informed written consent to proceed with treatment after reviewing the details related to the planned course of therapy. The consent form was witnessed and verified by the simulation staff.    Then, the patient was set-up in a stable reproducible supine position for radiation therapy with her ipsilateral arm over her head, and her upper body secured in a custom-made Vac-lok device.  CT images were obtained.  Surface markings were placed.  The CT images were loaded into the planning software.    Special treatment procedure:  Special treatment procedure was performed today due to the extra time and effort required by myself to plan and prepare this patient for deep inspiration breath hold technique.  I have determined cardiac sparing to be of benefit to this patient to prevent long term cardiac damage due to radiation of the heart.  Bellows were placed on the patient's abdomen. To facilitate cardiac sparing, the patient was coached by the radiation therapists on breath hold techniques and breathing practice was performed. Practice waveforms were obtained. The patient was then scanned while maintaining breath hold in the treatment position.  This image was then transferred over to the imaging specialist. The imaging specialist then created a fusion of the free breathing and breath hold scans using the chest wall as the stable structure. I personally reviewed the fusion  in axial, coronal and sagittal image planes.  Excellent cardiac sparing was obtained.  I felt the patient is an appropriate candidate for breath hold and the patient will be treated as such.  The image fusion was then reviewed with the patient to reinforce the necessity of reproducible breath hold.   TREATMENT PLANNING NOTE: Treatment planning then occurred.  The radiation prescription was entered and confirmed.     A total of 3 medically necessary complex treatment devices were fabricated and supervised by me: 2 fields with MLCs for custom blocks to protect heart, and lungs;  and, a Vac-lok. MORE COMPLEX DEVICES MAY BE MADE IN DOSIMETRY FOR FIELD IN FIELD BEAMS FOR DOSE HOMOGENEITY.  I have requested : 3D Simulation which is medically necessary to give adequate dose to at risk tissues while sparing lungs and heart.  I have requested a DVH of the following structures: lungs, heart, left lumpectomy cavity.    The patient will receive 40.05 Gy in 15 fractions to the left breast with 2 tangential fields.  This will be followed by a boost.  Optical Surface Tracking Plan:  Since intensity modulated radiotherapy (IMRT) and 3D conformal radiation treatment methods are predicated on accurate and precise positioning for treatment, intrafraction motion monitoring is medically necessary to ensure accurate and safe treatment delivery. The ability to quantify intrafraction motion without excessive ionizing radiation dose can only be performed with optical surface tracking. Accordingly, surface imaging offers the opportunity to obtain 3D measurements of patient position throughout IMRT and 3D treatments without excessive radiation exposure. I am ordering optical surface tracking for  this patient's upcoming course of radiotherapy.  ________________________________   Reference:  Particia Jasper, et al. Surface imaging-based analysis of intrafraction motion for breast radiotherapy patients.Journal of  [place], n. 6, nov. 2014. ISSN [id number].  Available at: <http://www.jacmp.org/index.php/jacmp/article/view/4957>.    -----------------------------------  Eppie Gibson, MD

## 2018-11-30 DIAGNOSIS — D0512 Intraductal carcinoma in situ of left breast: Secondary | ICD-10-CM | POA: Diagnosis not present

## 2018-12-06 ENCOUNTER — Ambulatory Visit
Admission: RE | Admit: 2018-12-06 | Discharge: 2018-12-06 | Disposition: A | Discharge status: Home / Self Care | Payer: 59 | Source: Ambulatory Visit | Attending: Radiation Oncology | Admitting: Radiation Oncology

## 2018-12-06 ENCOUNTER — Other Ambulatory Visit: Payer: Self-pay

## 2018-12-06 DIAGNOSIS — D0512 Intraductal carcinoma in situ of left breast: Secondary | ICD-10-CM | POA: Diagnosis not present

## 2018-12-07 ENCOUNTER — Ambulatory Visit
Admission: RE | Admit: 2018-12-07 | Discharge: 2018-12-07 | Disposition: A | Discharge status: Home / Self Care | Payer: 59 | Source: Ambulatory Visit | Attending: Radiation Oncology | Admitting: Radiation Oncology

## 2018-12-07 ENCOUNTER — Other Ambulatory Visit: Payer: Self-pay

## 2018-12-07 DIAGNOSIS — D0512 Intraductal carcinoma in situ of left breast: Secondary | ICD-10-CM | POA: Diagnosis not present

## 2018-12-08 ENCOUNTER — Ambulatory Visit
Admission: RE | Admit: 2018-12-08 | Discharge: 2018-12-08 | Disposition: A | Discharge status: Home / Self Care | Payer: 59 | Source: Ambulatory Visit | Attending: Radiation Oncology | Admitting: Radiation Oncology

## 2018-12-08 ENCOUNTER — Other Ambulatory Visit: Payer: Self-pay

## 2018-12-08 DIAGNOSIS — D0512 Intraductal carcinoma in situ of left breast: Secondary | ICD-10-CM

## 2018-12-08 MED ORDER — RADIAPLEXRX EX GEL
Freq: Once | CUTANEOUS | Status: AC
  Administered 2018-12-08: 09:00:00 via TOPICAL

## 2018-12-08 MED ORDER — ALRA NON-METALLIC DEODORANT (RAD-ONC)
1.0000 "application " | Freq: Once | TOPICAL | Status: AC
  Administered 2018-12-08: 1 via TOPICAL

## 2018-12-08 NOTE — Progress Notes (Signed)

## 2018-12-09 ENCOUNTER — Other Ambulatory Visit: Payer: Self-pay

## 2018-12-09 ENCOUNTER — Ambulatory Visit
Admission: RE | Admit: 2018-12-09 | Discharge: 2018-12-09 | Disposition: A | Discharge status: Home / Self Care | Payer: 59 | Source: Ambulatory Visit | Attending: Radiation Oncology | Admitting: Radiation Oncology

## 2018-12-09 DIAGNOSIS — D0512 Intraductal carcinoma in situ of left breast: Secondary | ICD-10-CM | POA: Diagnosis not present

## 2018-12-12 ENCOUNTER — Other Ambulatory Visit: Payer: Self-pay

## 2018-12-12 ENCOUNTER — Other Ambulatory Visit: Payer: Self-pay | Admitting: Radiation Oncology

## 2018-12-12 ENCOUNTER — Ambulatory Visit
Admission: RE | Admit: 2018-12-12 | Discharge: 2018-12-12 | Disposition: A | Discharge status: Home / Self Care | Payer: 59 | Source: Ambulatory Visit | Attending: Radiation Oncology | Admitting: Radiation Oncology

## 2018-12-12 DIAGNOSIS — D0512 Intraductal carcinoma in situ of left breast: Secondary | ICD-10-CM | POA: Diagnosis not present

## 2018-12-13 ENCOUNTER — Ambulatory Visit
Admission: RE | Admit: 2018-12-13 | Discharge: 2018-12-13 | Disposition: A | Discharge status: Home / Self Care | Payer: 59 | Source: Ambulatory Visit | Attending: Radiation Oncology | Admitting: Radiation Oncology

## 2018-12-13 ENCOUNTER — Other Ambulatory Visit: Payer: Self-pay

## 2018-12-13 ENCOUNTER — Encounter: Payer: Self-pay | Admitting: *Deleted

## 2018-12-13 DIAGNOSIS — D0512 Intraductal carcinoma in situ of left breast: Secondary | ICD-10-CM | POA: Diagnosis not present

## 2018-12-13 NOTE — Progress Notes (Signed)
[facility] Work  Clinical Social Work received referral from radiation oncology for emotional support/counseling.  CSW contacted patient at home to offer support and assess for needs.  Patient stated "this was not a good time to talk", and requested a call tomorrow (9/16) morning.  CSW team will contact patient again tomorrow morning.    Johnnye Lana, MSW, LCSW, OSW-C Clinical Social Worker [facility] [phone]

## 2018-12-14 ENCOUNTER — Ambulatory Visit
Admission: RE | Admit: 2018-12-14 | Discharge: 2018-12-14 | Disposition: A | Discharge status: Home / Self Care | Payer: 59 | Source: Ambulatory Visit | Attending: Radiation Oncology | Admitting: Radiation Oncology

## 2018-12-14 ENCOUNTER — Other Ambulatory Visit: Payer: Self-pay

## 2018-12-14 DIAGNOSIS — D0512 Intraductal carcinoma in situ of left breast: Secondary | ICD-10-CM | POA: Diagnosis not present

## 2018-12-15 ENCOUNTER — Encounter: Payer: Self-pay | Admitting: General Practice

## 2018-12-15 ENCOUNTER — Other Ambulatory Visit: Payer: Self-pay

## 2018-12-15 ENCOUNTER — Inpatient Hospital Stay: Payer: 59 | Attending: Oncology | Admitting: General Practice

## 2018-12-15 ENCOUNTER — Ambulatory Visit
Admission: RE | Admit: 2018-12-15 | Discharge: 2018-12-15 | Disposition: A | Discharge status: Home / Self Care | Payer: 59 | Source: Ambulatory Visit | Attending: Radiation Oncology | Admitting: Radiation Oncology

## 2018-12-15 DIAGNOSIS — D0512 Intraductal carcinoma in situ of left breast: Secondary | ICD-10-CM | POA: Diagnosis not present

## 2018-12-15 NOTE — Progress Notes (Signed)
[facility] CSW Progress Notes  Call to patient at request of treatment team.  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  "I just cant stand it."  Has tried to "call out" from her job, but must be 95% "reliable" and does not want to lose her job.    "When you work hard and all the money you get goes goes to bills, its frustrating."  "It never stops, I have to do mammograms, CT scans, like a waterfall."  Has gotten information on [place] and [place] - is not sure whether she still has them.  Will review and determine if she can qualify.  Spouse's income is high but they have many required expenses.  Have not met the "stop loss" for her insurance coverage, still receiving large bills for ongoing treatments.  Will send information on these options for patient - she will decide if she meets financial qualifications.    Discussed options available to her - she does not have short or long term disability available to her through her job.  FMLA would be unpaid leave, thus may not be ideal for her.  Is getting pressure from supervisor re her productivity. "I cannot miss my job."  But is increasingly fatigued and stressed by demands of treatment and side effects of fatigue and intermittent forgetfulness.  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  Asked to consider whether a short term leave from job might allow her to heal and regroup from treatment stress, states she will talk w her supervisor and explore options.    Talked about ways to reduce stress by increasing activities that buffer stress.  Discussed options for physical activity (likes bicycling and being out in nature) as well as trying options for increasing connection w others dealing w  cancer through [place] and [employer].  Made referral to [facility] counseling intern.  Edwyna Shell, LCSW Clinical Social Worker Phone:  [phone]

## 2018-12-16 ENCOUNTER — Ambulatory Visit
Admission: RE | Admit: 2018-12-16 | Discharge: 2018-12-16 | Disposition: A | Discharge status: Home / Self Care | Payer: 59 | Source: Ambulatory Visit | Attending: Radiation Oncology | Admitting: Radiation Oncology

## 2018-12-16 ENCOUNTER — Other Ambulatory Visit: Payer: Self-pay

## 2018-12-16 DIAGNOSIS — D0512 Intraductal carcinoma in situ of left breast: Secondary | ICD-10-CM | POA: Diagnosis not present

## 2018-12-16 NOTE — Progress Notes (Signed)
Spoke to patient by phone on 12/16/18 to offer a phone support appointment at a time that is convenient for her.  Patient is now scheduled for Monday 12/19/18 at 9:30am for a telehealth counseling appointment.  I mailed Informed Consent and PDS paperwork to her for signatures today.   [facility] Counseling Intern Voicemail:  [phone]

## 2018-12-19 ENCOUNTER — Other Ambulatory Visit: Payer: Self-pay

## 2018-12-19 ENCOUNTER — Ambulatory Visit
Admission: RE | Admit: 2018-12-19 | Discharge: 2018-12-19 | Disposition: A | Discharge status: Home / Self Care | Payer: 59 | Source: Ambulatory Visit | Attending: Radiation Oncology | Admitting: Radiation Oncology

## 2018-12-19 DIAGNOSIS — D0512 Intraductal carcinoma in situ of left breast: Secondary | ICD-10-CM | POA: Diagnosis not present

## 2018-12-19 NOTE — Progress Notes (Signed)
I spoke to [facility] by phone today for our first counseling session. The patient reported isolation, anxiety, sleep disruption, and mood swings. She noted that her husband is her source of support, but that because they moved here from [place] not long before the Covid-19 pandemic began, they have had little opportunity to make friends locally. Today we focused on her recent challenges with emotional regulation -- anger and irritation experienced when on the phone at work with customers. The patient reported that since starting radiation she has no tolerance for rude behavior and reacts quickly out of irritation or anger. She stated that it is not in character for her to respond in this way, that she is usually calm, kind, and compassionate. The patient will try out several strategies that we discussed including practicing emotional awareness by noticing when she is beginning to feel angry or irritable so she is able to utilize the other new strategies (e.g. keeping a written list of appropriate responses on hand at work to read directly from when she notices she feels angry and irritation bubbling up and also selecting a calming mantra to repeat silently to herself in these difficult situations with clients). Additionally, I encouraged her to take some time off from work as suggested by [place] Sp[place]iel.    Next Counseling Appt:  12/26/2018 at 10:00am via phone   [facility] Counseling Intern Voicemail:  [phone]

## 2018-12-20 ENCOUNTER — Ambulatory Visit
Admission: RE | Admit: 2018-12-20 | Discharge: 2018-12-20 | Disposition: A | Discharge status: Home / Self Care | Payer: 59 | Source: Ambulatory Visit | Attending: Radiation Oncology | Admitting: Radiation Oncology

## 2018-12-20 ENCOUNTER — Other Ambulatory Visit: Payer: Self-pay

## 2018-12-20 DIAGNOSIS — D0512 Intraductal carcinoma in situ of left breast: Secondary | ICD-10-CM | POA: Diagnosis not present

## 2018-12-21 ENCOUNTER — Other Ambulatory Visit: Payer: Self-pay

## 2018-12-21 ENCOUNTER — Ambulatory Visit
Admission: RE | Admit: 2018-12-21 | Discharge: 2018-12-21 | Disposition: A | Discharge status: Home / Self Care | Payer: 59 | Source: Ambulatory Visit | Attending: Radiation Oncology | Admitting: Radiation Oncology

## 2018-12-21 DIAGNOSIS — D0512 Intraductal carcinoma in situ of left breast: Secondary | ICD-10-CM | POA: Diagnosis not present

## 2018-12-22 ENCOUNTER — Ambulatory Visit
Admission: RE | Admit: 2018-12-22 | Discharge: 2018-12-22 | Disposition: A | Discharge status: Home / Self Care | Payer: 59 | Source: Ambulatory Visit | Attending: Radiation Oncology | Admitting: Radiation Oncology

## 2018-12-22 ENCOUNTER — Other Ambulatory Visit: Payer: Self-pay

## 2018-12-22 DIAGNOSIS — D0512 Intraductal carcinoma in situ of left breast: Secondary | ICD-10-CM | POA: Diagnosis not present

## 2018-12-23 ENCOUNTER — Encounter: Payer: Self-pay | Admitting: *Deleted

## 2018-12-23 ENCOUNTER — Other Ambulatory Visit: Payer: Self-pay

## 2018-12-23 ENCOUNTER — Ambulatory Visit
Admission: RE | Admit: 2018-12-23 | Discharge: 2018-12-23 | Disposition: A | Discharge status: Home / Self Care | Payer: 59 | Source: Ambulatory Visit | Attending: Radiation Oncology | Admitting: Radiation Oncology

## 2018-12-23 DIAGNOSIS — D0512 Intraductal carcinoma in situ of left breast: Secondary | ICD-10-CM | POA: Diagnosis not present

## 2018-12-26 ENCOUNTER — Ambulatory Visit: Payer: 59 | Admitting: Radiation Oncology

## 2018-12-26 ENCOUNTER — Ambulatory Visit
Admission: RE | Admit: 2018-12-26 | Discharge: 2018-12-26 | Disposition: A | Discharge status: Home / Self Care | Payer: 59 | Source: Ambulatory Visit | Attending: Radiation Oncology | Admitting: Radiation Oncology

## 2018-12-26 ENCOUNTER — Other Ambulatory Visit: Payer: Self-pay

## 2018-12-26 DIAGNOSIS — D0512 Intraductal carcinoma in situ of left breast: Secondary | ICD-10-CM | POA: Diagnosis not present

## 2018-12-26 NOTE — Progress Notes (Signed)
Spoke with patient on 12/26/18 for a counseling appointment by phone. Pt reported feeling emotionally much better since our last convo. Stated that she was able to take the past wk off from work, which allowed her to rest and spend time with her family. She stated that creating a loving home environment for her son is her priority. Pt reported feeling fatigued as a result of the tx. As a result she reported that she cannot help with housework as before, but that her husband is able to manage these responsibilities for the time being. The pt stated that she feels "lucky" to have a low stage of cancer with good medical care. We will continue to work on strategies to use at work to decrease her anger and frustration in response to rude or disrespectful customers. The pt chose "Let it be" as her mantra to use for calming. She reported that she will take one additional week off before returning to work.   Next counseling appointment: Oct. 5 at 10:00am via phone   [place] Counseling Intern Voicemail:  [phone]

## 2018-12-27 ENCOUNTER — Ambulatory Visit
Admission: RE | Admit: 2018-12-27 | Discharge: 2018-12-27 | Disposition: A | Discharge status: Home / Self Care | Payer: 59 | Source: Ambulatory Visit | Attending: Radiation Oncology | Admitting: Radiation Oncology

## 2018-12-27 ENCOUNTER — Other Ambulatory Visit: Payer: Self-pay

## 2018-12-27 DIAGNOSIS — D0512 Intraductal carcinoma in situ of left breast: Secondary | ICD-10-CM | POA: Diagnosis not present

## 2018-12-28 ENCOUNTER — Other Ambulatory Visit: Payer: Self-pay

## 2018-12-28 ENCOUNTER — Ambulatory Visit
Admission: RE | Admit: 2018-12-28 | Discharge: 2018-12-28 | Disposition: A | Discharge status: Home / Self Care | Payer: 59 | Source: Ambulatory Visit | Attending: Radiation Oncology | Admitting: Radiation Oncology

## 2018-12-28 DIAGNOSIS — D0512 Intraductal carcinoma in situ of left breast: Secondary | ICD-10-CM | POA: Diagnosis not present

## 2018-12-29 ENCOUNTER — Ambulatory Visit
Admission: RE | Admit: 2018-12-29 | Discharge: 2018-12-29 | Disposition: A | Discharge status: Home / Self Care | Payer: 59 | Source: Ambulatory Visit | Attending: Radiation Oncology | Admitting: Radiation Oncology

## 2018-12-29 ENCOUNTER — Other Ambulatory Visit: Payer: Self-pay

## 2018-12-29 DIAGNOSIS — D0512 Intraductal carcinoma in situ of left breast: Secondary | ICD-10-CM | POA: Insufficient documentation

## 2018-12-29 DIAGNOSIS — Z170 Estrogen receptor positive status [ER+]: Secondary | ICD-10-CM | POA: Diagnosis not present

## 2018-12-29 DIAGNOSIS — Z510 Encounter for antineoplastic radiation therapy: Secondary | ICD-10-CM | POA: Insufficient documentation

## 2018-12-30 ENCOUNTER — Other Ambulatory Visit: Payer: Self-pay

## 2018-12-30 ENCOUNTER — Ambulatory Visit
Admission: RE | Admit: 2018-12-30 | Discharge: 2018-12-30 | Disposition: A | Discharge status: Home / Self Care | Payer: 59 | Source: Ambulatory Visit | Attending: Radiation Oncology | Admitting: Radiation Oncology

## 2018-12-30 DIAGNOSIS — D0512 Intraductal carcinoma in situ of left breast: Secondary | ICD-10-CM | POA: Diagnosis not present

## 2019-01-02 ENCOUNTER — Other Ambulatory Visit: Payer: Self-pay

## 2019-01-02 ENCOUNTER — Ambulatory Visit
Admission: RE | Admit: 2019-01-02 | Discharge: 2019-01-02 | Disposition: A | Discharge status: Home / Self Care | Payer: 59 | Source: Ambulatory Visit | Attending: Radiation Oncology | Admitting: Radiation Oncology

## 2019-01-02 ENCOUNTER — Encounter: Payer: Self-pay | Admitting: Radiation Oncology

## 2019-01-02 ENCOUNTER — Encounter: Payer: Self-pay | Admitting: *Deleted

## 2019-01-02 DIAGNOSIS — D0512 Intraductal carcinoma in situ of left breast: Secondary | ICD-10-CM | POA: Diagnosis not present

## 2019-01-02 MED ORDER — RADIAPLEXRX EX GEL
Freq: Once | CUTANEOUS | Status: AC
  Administered 2019-01-02: 09:00:00 via TOPICAL

## 2019-01-02 NOTE — Progress Notes (Signed)
Spoke to the pt by phone on 01/02/2019.  She reported feeling much better emotionally after taking the past few weeks off from her job. She stated that she is "happy" to complete her treatment today and is looking forward to a reduction in side effects soon. Pt reports currently feeling itchy and uncomfortable as a result of treatment. Pt is returning to her job tomorrow and stated that she "feels ready" and has strategies to use to help herself stay calm if needed. Pt stated she is not interested in scheduling additional counseling sessions at this time.  I will call the pt in 3 weeks to check-in and offer emotional and mental helath support.    [facility] [place] Counseling Intern Voicemail:  [phone]

## 2019-01-04 ENCOUNTER — Telehealth: Payer: Self-pay | Admitting: Oncology

## 2019-01-04 NOTE — Telephone Encounter (Signed)
Faxed most recent office notes to [employer] @ [phone] Attn: Clearnce Sorrel

## 2019-01-25 ENCOUNTER — Encounter: Payer: Self-pay | Admitting: Family Medicine

## 2019-01-25 ENCOUNTER — Other Ambulatory Visit: Payer: Self-pay

## 2019-01-25 ENCOUNTER — Ambulatory Visit (INDEPENDENT_AMBULATORY_CARE_PROVIDER_SITE_OTHER): Payer: 59 | Admitting: Family Medicine

## 2019-01-25 VITALS — BP 95/68 | HR 67 | Temp 97.8°F | Ht 61.0 in | Wt 126.0 lb

## 2019-01-25 DIAGNOSIS — Z23 Encounter for immunization: Secondary | ICD-10-CM | POA: Diagnosis not present

## 2019-01-25 DIAGNOSIS — F5101 Primary insomnia: Secondary | ICD-10-CM

## 2019-01-25 DIAGNOSIS — Z0000 Encounter for general adult medical examination without abnormal findings: Secondary | ICD-10-CM | POA: Diagnosis not present

## 2019-01-25 DIAGNOSIS — Z114 Encounter for screening for human immunodeficiency virus [HIV]: Secondary | ICD-10-CM

## 2019-01-25 DIAGNOSIS — R7303 Prediabetes: Secondary | ICD-10-CM | POA: Diagnosis not present

## 2019-01-25 DIAGNOSIS — E559 Vitamin D deficiency, unspecified: Secondary | ICD-10-CM | POA: Diagnosis not present

## 2019-01-25 LAB — CBC WITH DIFFERENTIAL/PLATELET
Basophils Absolute: 0 10*3/uL (ref 0.0–0.1)
Basophils Relative: 0.7 % (ref 0.0–3.0)
Eosinophils Absolute: 0.1 10*3/uL (ref 0.0–0.7)
Eosinophils Relative: 1.1 % (ref 0.0–5.0)
HCT: 40.3 % (ref 36.0–46.0)
Hemoglobin: 13.7 g/dL (ref 12.0–15.0)
Lymphocytes Relative: 23.6 % (ref 12.0–46.0)
Lymphs Abs: 1.2 10*3/uL (ref 0.7–4.0)
MCHC: 34 g/dL (ref 30.0–36.0)
MCV: 90.9 fl (ref 78.0–100.0)
Monocytes Absolute: 0.3 10*3/uL (ref 0.1–1.0)
Monocytes Relative: 5.1 % (ref 3.0–12.0)
Neutro Abs: 3.5 10*3/uL (ref 1.4–7.7)
Neutrophils Relative %: 69.5 % (ref 43.0–77.0)
Platelets: 306 10*3/uL (ref 150.0–400.0)
RBC: 4.43 Mil/uL (ref 3.87–5.11)
RDW: 12.9 % (ref 11.5–15.5)
WBC: 5 10*3/uL (ref 4.0–10.5)

## 2019-01-25 LAB — LIPID PANEL
Cholesterol: 193 mg/dL (ref 0–200)
HDL: 65.4 mg/dL (ref >39.00)
LDL Cholesterol: 103 mg/dL — ABNORMAL HIGH (ref 0–99)
NonHDL: 127.83
Total CHOL/HDL Ratio: 3
Triglycerides: 125 mg/dL (ref 0.0–149.0)
VLDL: 25 mg/dL (ref 0.0–40.0)

## 2019-01-25 LAB — HEMOGLOBIN A1C: Hgb A1c MFr Bld: 6 % (ref 4.6–6.5)

## 2019-01-25 LAB — COMPREHENSIVE METABOLIC PANEL
ALT: 17 U/L (ref 0–35)
AST: 16 U/L (ref 0–37)
Albumin: 4.6 g/dL (ref 3.5–5.2)
Alkaline Phosphatase: 56 U/L (ref 39–117)
BUN: 8 mg/dL (ref 6–23)
CO2: 29 mEq/L (ref 19–32)
Calcium: 9.3 mg/dL (ref 8.4–10.5)
Chloride: 103 mEq/L (ref 96–112)
Creatinine, Ser: 0.71 mg/dL (ref 0.40–1.20)
GFR: 87.27 mL/min (ref >60.00)
Glucose, Bld: 99 mg/dL (ref 70–99)
Potassium: 3.9 mEq/L (ref 3.5–5.1)
Sodium: 140 mEq/L (ref 135–145)
Total Bilirubin: 0.6 mg/dL (ref 0.2–1.2)
Total Protein: 7.1 g/dL (ref 6.0–8.3)

## 2019-01-25 NOTE — Progress Notes (Signed)
Patient: Shelley Andrews MRN: [id number] DOB: Mar 09, 1971 PCP: Aretta Nip, MD       Subjective:  Chief Complaint  Patient presents with  . Establish Care  . Annual Exam    HPI: The patient is a 48 y.o. female who presents today for annual exam. She denies any changes to past medical history. There have been no recent hospitalizations. They are following a well balanced diet and exercise plan. Weight has been stable.   Hx of gestational diabetes. Unsure of family history. She is from [place] and states that her culture only goes to doctor if they are sick or need something. No preventative care. Her mom died at 69 years of age due to CVA.    She has complaints of sleep disturbance. She has tried benadryl and it makes her too sleepy. She has complaints of problems falling asleep. She doesn't fall asleep until 2-3Am. She does do a lot of screen time before bed, no naps after 2pm. No caffiene. Not really exercising. No tv in bed. She has only tried bendaryl. She tried melatonin in the past and didn't like this. Has happened since she was a kid.   Immunization History  Administered Date(s) Administered  . Influenza,inj,Quad PF,6+ Mos 11/27/2017  . Influenza-Unspecified 01/08/2019  . Tdap 01/25/2019   Colonoscopy: next year.  Mammogram: utd.  Pap smear: in [place]. Had in past 3 years.  Hiv: today  Flu: done this year Tdap: today   Review of Systems  Constitutional: Positive for fatigue. Negative for chills and fever.  HENT: Negative for congestion, dental problem, ear pain, hearing loss, postnasal drip, rhinorrhea, sore throat and trouble swallowing.   Eyes: Negative for visual disturbance.  Respiratory: Negative for cough, chest tightness and shortness of breath.   Cardiovascular: Negative for chest pain, palpitations and leg swelling.  Gastrointestinal: Negative for abdominal pain, blood in stool, diarrhea, nausea and vomiting.  Endocrine: Negative for cold intolerance,  polydipsia, polyphagia and polyuria.  Genitourinary: Negative for dysuria and hematuria.  Musculoskeletal: Negative for arthralgias.  Skin: Negative for rash.  Neurological: Negative for dizziness and headaches.  Psychiatric/Behavioral: Positive for sleep disturbance. Negative for dysphoric mood. The patient is not nervous/anxious.     Allergies Patient has No Known Allergies.  Past Medical History Patient  has a past medical history of Breast cancer ([place]), Family history of breast cancer, and Gestational diabetes.  Surgical History Patient  has a past surgical history that includes Cesarean section; Breast lumpectomy with radioactive seed localization (Left, 10/20/2018); Breast biopsy (Bilateral); Breast excisional biopsy (Bilateral); and Breast lumpectomy (Left, 09/2018).  Family History Pateint's family history includes Breast cancer in her maternal aunt, paternal aunt, and sister; Lung cancer in her father; Stroke in her mother.  Social History Patient  reports that she quit smoking about 6 months ago. She smoked 1.00 pack per day. She has never used smokeless tobacco. She reports previous alcohol use. She reports that she does not use drugs.    Objective: Vitals:   01/25/19 0812  BP: 95/68  Pulse: 67  Temp: 97.8 F (36.6 C)  TempSrc: Skin  SpO2: 97%  Weight: 126 lb (57.2 kg)  Height: 5\' 1"  (1.549 m)    Body mass index is 23.81 kg/m.  Physical Exam Vitals signs reviewed.  Constitutional:      Appearance: Normal appearance. She is well-developed and normal weight.  HENT:     Head: Normocephalic and atraumatic.     Right Ear: Tympanic membrane, ear canal and external  ear normal.     Left Ear: Tympanic membrane, ear canal and external ear normal.     Nose: Nose normal.     Mouth/Throat:     Mouth: Mucous membranes are moist.  Eyes:     Extraocular Movements: Extraocular movements intact.     Conjunctiva/sclera: Conjunctivae normal.     Pupils: Pupils are equal,  round, and reactive to light.  Neck:     Musculoskeletal: Normal range of motion and neck supple.     Thyroid: No thyromegaly.  Cardiovascular:     Rate and Rhythm: Normal rate and regular rhythm.     Pulses: Normal pulses.     Heart sounds: Normal heart sounds. No murmur.  Pulmonary:     Effort: Pulmonary effort is normal.     Breath sounds: Normal breath sounds.  Abdominal:     General: Abdomen is flat. Bowel sounds are normal. There is no distension.     Palpations: Abdomen is soft.     Tenderness: There is no abdominal tenderness.  Musculoskeletal: Normal range of motion.  Lymphadenopathy:     Cervical: No cervical adenopathy.  Skin:    General: Skin is warm and dry.     Capillary Refill: Capillary refill takes less than 2 seconds.     Findings: No rash.  Neurological:     General: No focal deficit present.     Mental Status: She is alert and oriented to person, place, and time.     Cranial Nerves: No cranial nerve deficit.     Coordination: Coordination normal.     Deep Tendon Reflexes: Reflexes normal.  Psychiatric:        Mood and Affect: Mood normal.        Behavior: Behavior normal.    Depression screen [facility] 2/9 01/25/2019  Decreased Interest 0  Down, Depressed, Hopeless 0  PHQ - 2 Score 0       Assessment/plan: 1. Annual physical exam Will do fasting labs today. Went over HM and is due for pap smear. Will come back for this. tdap today. Will need cscope next year and discussed this. Encouraged exercise as she doesn't really do any exercise. F/u for pap smear.  Patient counseling [x]    Nutrition: Stressed importance of moderation in sodium/caffeine intake, saturated fat and cholesterol, caloric balance, sufficient intake of fresh fruits, vegetables, fiber, calcium, iron, and 1 mg of folate supplement per day (for females capable of pregnancy).  [x]    Stressed the importance of regular exercise.   []    Substance Abuse: Discussed cessation/primary prevention of  tobacco, alcohol, or other drug use; driving or other dangerous activities under the influence; availability of treatment for abuse.   [x]    Injury prevention: Discussed safety belts, safety helmets, smoke detector, smoking near bedding or upholstery.   [x]    Sexuality: Discussed sexually transmitted diseases, partner selection, use of condoms, avoidance of unintended pregnancy  and contraceptive alternatives.  [x]    Dental health: Discussed importance of regular tooth brushing, flossing, and dental visits.  [x]    Health maintenance and immunizations reviewed. Please refer to Health maintenance section.    - Comprehensive metabolic panel - CBC with Differential/Platelet - TSH - Lipid panel - VITAMIN D 25 Hydroxy (Vit-D Deficiency, Fractures)  2. Prediabetes  - Hemoglobin A1c  3. Encounter for screening for HIV  - HIV Antibody (routine testing w rflx)  4. Need for Tdap vaccination  - Tdap vaccine greater than or equal to 7yo IM  5. Primary  insomnia Would like to do a more natural approach on this. Went over sleep hygiene in detail and gav her handout. She needs to start exercising as well as not do screen time before bed. We will do ashwagandha trial before bed and if this doesn't work discussed next step would be trazodone. Can f/u at time of pap smear or let me know sooner if not working.     Return in about 1 month (around 02/25/2019) for pap smear .     Orma Flaming, MD [place]  01/25/2019

## 2019-01-25 NOTE — Patient Instructions (Addendum)
Next year you will need colonoscopy. Due for pap smear, so either come back here when you have time or gynecologist's I recommend: Dr. Sumner Boast, Dr. Edwinna Areola.   Will do routine lab work today.   -I want you to try ashwagandha before bed. Take 324m before bed (341m-1 hour).   Insomnia Insomnia is a sleep disorder that makes it difficult to fall asleep or stay asleep. Insomnia can cause fatigue, low energy, difficulty concentrating, mood swings, and poor performance at work or school. There are three different ways to classify insomnia:  Difficulty falling asleep.  Difficulty staying asleep.  Waking up too early in the morning. Any type of insomnia can be long-term (chronic) or short-term (acute). Both are common. Short-term insomnia usually lasts for three months or less. Chronic insomnia occurs at least three times a week for longer than three months. What are the causes? Insomnia may be caused by another condition, situation, or substance, such as:  Anxiety.  Certain medicines.  Gastroesophageal reflux disease (GERD) or other gastrointestinal conditions.  Asthma or other breathing conditions.  Restless legs syndrome, sleep apnea, or other sleep disorders.  Chronic pain.  Menopause.  Stroke.  Abuse of alcohol, tobacco, or illegal drugs.  Mental health conditions, such as depression.  Caffeine.  Neurological disorders, such as Alzheimer's disease.  An overactive thyroid (hyperthyroidism). Sometimes, the cause of insomnia may not be known. What increases the risk? Risk factors for insomnia include:  Gender. Women are affected more often than men.  Age. Insomnia is more common as you get older.  Stress.  Lack of exercise.  Irregular work schedule or working night shifts.  Traveling between different time zones.  Certain medical and mental health conditions. What are the signs or symptoms? If you have insomnia, the main symptom is having trouble  falling asleep or having trouble staying asleep. This may lead to other symptoms, such as:  Feeling fatigued or having low energy.  Feeling nervous about going to sleep.  Not feeling rested in the morning.  Having trouble concentrating.  Feeling irritable, anxious, or depressed. How is this diagnosed? This condition may be diagnosed based on:  Your symptoms and medical history. Your health care provider may ask about: ? Your sleep habits. ? Any medical conditions you have. ? Your mental health.  A physical exam. How is this treated? Treatment for insomnia depends on the cause. Treatment may focus on treating an underlying condition that is causing insomnia. Treatment may also include:  Medicines to help you sleep.  Counseling or therapy.  Lifestyle adjustments to help you sleep better. Follow these instructions at home: Eating and drinking   Limit or avoid alcohol, caffeinated beverages, and cigarettes, especially close to bedtime. These can disrupt your sleep.  Do not eat a large meal or eat spicy foods right before bedtime. This can lead to digestive discomfort that can make it hard for you to sleep. Sleep habits   Keep a sleep diary to help you and your health care provider figure out what could be causing your insomnia. Write down: ? When you sleep. ? When you wake up during the night. ? How well you sleep. ? How rested you feel the next day. ? Any side effects of medicines you are taking. ? What you eat and drink.  Make your bedroom a dark, comfortable place where it is easy to fall asleep. ? Put up shades or blackout curtains to block light from outside. ? Use a white noise machine  to block noise. ? Keep the temperature cool.  Limit screen use before bedtime. This includes: ? Watching TV. ? Using your smartphone, tablet, or computer.  Stick to a routine that includes going to bed and waking up at the same times every day and night. This can help you fall  asleep faster. Consider making a quiet activity, such as reading, part of your nighttime routine.  Try to avoid taking naps during the day so that you sleep better at night.  Get out of bed if you are still awake after 15 minutes of trying to sleep. Keep the lights down, but try reading or doing a quiet activity. When you feel sleepy, go back to bed. General instructions  Take over-the-counter and prescription medicines only as told by your health care provider.  Exercise regularly, as told by your health care provider. Avoid exercise starting several hours before bedtime.  Use relaxation techniques to manage stress. Ask your health care provider to suggest some techniques that may work well for you. These may include: ? Breathing exercises. ? Routines to release muscle tension. ? Visualizing peaceful scenes.  Make sure that you drive carefully. Avoid driving if you feel very sleepy.  Keep all follow-up visits as told by your health care provider. This is important. Contact a health care provider if:  You are tired throughout the day.  You have trouble in your daily routine due to sleepiness.  You continue to have sleep problems, or your sleep problems get worse. Get help right away if:  You have serious thoughts about hurting yourself or someone else. If you ever feel like you may hurt yourself or others, or have thoughts about taking your own life, get help right away. You can go to your nearest emergency department or call:  Your local emergency services (911 in the [place]  A suicide crisis helpline, such as the [place] at 731-678-1667. This is open 24 hours a day. Summary  Insomnia is a sleep disorder that makes it difficult to fall asleep or stay asleep.  Insomnia can be long-term (chronic) or short-term (acute).  Treatment for insomnia depends on the cause. Treatment may focus on treating an underlying condition that is causing insomnia.   Keep a sleep diary to help you and your health care provider figure out what could be causing your insomnia. This information is not intended to replace advice given to you by your health care provider. Make sure you discuss any questions you have with your health care provider. Document Released: 03/13/2000 Document Revised: 02/26/2017 Document Reviewed: 12/24/2016 Elsevier Patient Education  2020 [employer]   Preventive Care 7-27 Years Old, Female Preventive care refers to visits with your health care provider and lifestyle choices that can promote health and wellness. This includes:  A yearly physical exam. This may also be called an annual well check.  Regular dental visits and eye exams.  Immunizations.  Screening for certain conditions.  Healthy lifestyle choices, such as eating a healthy diet, getting regular exercise, not using drugs or products that contain nicotine and tobacco, and limiting alcohol use. What can I expect for my preventive care visit? Physical exam Your health care provider will check your:  Height and weight. This may be used to calculate body mass index (BMI), which tells if you are at a healthy weight.  Heart rate and blood pressure.  Skin for abnormal spots. Counseling Your health care provider may ask you questions about your:  Alcohol, tobacco,  and drug use.  Emotional well-being.  Home and relationship well-being.  Sexual activity.  Eating habits.  Work and work Statistician.  Method of birth control.  Menstrual cycle.  Pregnancy history. What immunizations do I need?  Influenza (flu) vaccine  This is recommended every year. Tetanus, diphtheria, and pertussis (Tdap) vaccine  You may need a Td booster every 10 years. Varicella (chickenpox) vaccine  You may need this if you have not been vaccinated. Zoster (shingles) vaccine  You may need this after age 78. Measles, mumps, and rubella (MMR) vaccine  You may need at  least one dose of MMR if you were born in 1957 or later. You may also need a second dose. Pneumococcal conjugate (PCV13) vaccine  You may need this if you have certain conditions and were not previously vaccinated. Pneumococcal polysaccharide (PPSV23) vaccine  You may need one or two doses if you smoke cigarettes or if you have certain conditions. Meningococcal conjugate (MenACWY) vaccine  You may need this if you have certain conditions. Hepatitis A vaccine  You may need this if you have certain conditions or if you travel or work in places where you may be exposed to hepatitis A. Hepatitis B vaccine  You may need this if you have certain conditions or if you travel or work in places where you may be exposed to hepatitis B. Haemophilus influenzae type b (Hib) vaccine  You may need this if you have certain conditions. Human papillomavirus (HPV) vaccine  If recommended by your health care provider, you may need three doses over 6 months. You may receive vaccines as individual doses or as more than one vaccine together in one shot (combination vaccines). Talk with your health care provider about the risks and benefits of combination vaccines. What tests do I need? Blood tests  Lipid and cholesterol levels. These may be checked every 5 years, or more frequently if you are over 34 years old.  Hepatitis C test.  Hepatitis B test. Screening  Lung cancer screening. You may have this screening every year starting at age 19 if you have a 30-pack-year history of smoking and currently smoke or have quit within the past 15 years.  Colorectal cancer screening. All adults should have this screening starting at age 50 and continuing until age 31. Your health care provider may recommend screening at age 13 if you are at increased risk. You will have tests every 1-10 years, depending on your results and the type of screening test.  Diabetes screening. This is done by checking your blood sugar  (glucose) after you have not eaten for a while (fasting). You may have this done every 1-3 years.  Mammogram. This may be done every 1-2 years. Talk with your health care provider about when you should start having regular mammograms. This may depend on whether you have a family history of breast cancer.  BRCA-related cancer screening. This may be done if you have a family history of breast, ovarian, tubal, or peritoneal cancers.  Pelvic exam and Pap test. This may be done every 3 years starting at age 18. Starting at age 67, this may be done every 5 years if you have a Pap test in combination with an HPV test. Other tests  Sexually transmitted disease (STD) testing.  Bone density scan. This is done to screen for osteoporosis. You may have this scan if you are at high risk for osteoporosis. Follow these instructions at home: Eating and drinking  Eat a diet that includes  fresh fruits and vegetables, whole grains, lean protein, and low-fat dairy.  Take vitamin and mineral supplements as recommended by your health care provider.  Do not drink alcohol if: ? Your health care provider tells you not to drink. ? You are pregnant, may be pregnant, or are planning to become pregnant.  If you drink alcohol: ? Limit how much you have to 0-1 drink a day. ? Be aware of how much alcohol is in your drink. In the [place] one drink equals one 12 oz bottle of beer (355 mL), one 5 oz glass of wine (148 mL), or one 1 oz glass of hard liquor (44 mL). Lifestyle  Take daily care of your teeth and gums.  Stay active. Exercise for at least 30 minutes on 5 or more days each week.  Do not use any products that contain nicotine or tobacco, such as cigarettes, e-cigarettes, and chewing tobacco. If you need help quitting, ask your health care provider.  If you are sexually active, practice safe sex. Use a condom or other form of birth control (contraception) in order to prevent pregnancy and STIs (sexually  transmitted infections).  If told by your health care provider, take low-dose aspirin daily starting at age 81. What's next?  Visit your health care provider once a year for a well check visit.  Ask your health care provider how often you should have your eyes and teeth checked.  Stay up to date on all vaccines. This information is not intended to replace advice given to you by your health care provider. Make sure you discuss any questions you have with your health care provider. Document Released: 04/12/2015 Document Revised: 11/25/2017 Document Reviewed: 11/25/2017 Elsevier Patient Education  2020 [employer].

## 2019-01-26 LAB — TSH: TSH: 1.72 u[IU]/mL (ref 0.35–4.50)

## 2019-01-26 LAB — VITAMIN D 25 HYDROXY (VIT D DEFICIENCY, FRACTURES): VITD: 24.59 ng/mL — ABNORMAL LOW (ref 30.00–100.00)

## 2019-01-26 LAB — HIV ANTIBODY (ROUTINE TESTING W REFLEX): HIV 1&2 Ab, 4th Generation: NONREACTIVE

## 2019-01-27 ENCOUNTER — Encounter: Payer: Self-pay | Admitting: Family Medicine

## 2019-01-27 ENCOUNTER — Other Ambulatory Visit: Payer: Self-pay | Admitting: Family Medicine

## 2019-01-27 DIAGNOSIS — R7303 Prediabetes: Secondary | ICD-10-CM | POA: Insufficient documentation

## 2019-01-27 DIAGNOSIS — E559 Vitamin D deficiency, unspecified: Secondary | ICD-10-CM | POA: Insufficient documentation

## 2019-01-27 MED ORDER — VITAMIN D (ERGOCALCIFEROL) 1.25 MG (50000 UNIT) PO CAPS: ORAL_CAPSULE | ORAL | 0 refills | Status: DC

## 2019-01-30 ENCOUNTER — Telehealth: Payer: Self-pay | Admitting: *Deleted

## 2019-01-30 NOTE — Telephone Encounter (Signed)
Resending MyChart activation code

## 2019-01-31 ENCOUNTER — Telehealth: Payer: Self-pay | Admitting: *Deleted

## 2019-01-31 NOTE — Telephone Encounter (Signed)
Patient had questions regarding her lab results not available on MyChart. Marked show results in MyChart. Apparently did not display the results for her to see. Suggested calling the MyChart Helpline. Provided the number.

## 2019-02-03 ENCOUNTER — Ambulatory Visit: Payer: Self-pay | Admitting: Radiation Oncology

## 2019-02-07 NOTE — Progress Notes (Signed)
I called the patient today about her upcoming follow-up appointment in radiation oncology.   Given concerns about the COVID-19 pandemic, I offered a phone assessment with the patient to determine if coming to the clinic was necessary. She accepted.  I let the patient know that I had spoken with Dr. Isidore Moos, and she wanted them to know the importance of washing their hands for at least 20 seconds at a time, especially after going out in public, and before they eat.  Limit going out in public whenever possible. Do not touch your face, unless your hands are clean, such as when bathing. Get plenty of rest, eat well, and stay hydrated.   The patient denies any symptomatic concerns.  Specifically, they report good healing of their skin in the radiation fields.  Skin is intact.    I recommended that she continue skin care by applying oil or lotion with vitamin E to the skin in the radiation fields, BID, for 2 more months.  Continue follow-up with medical oncology - follow-up is scheduled on 06/15/19 with Wilber Bihari NP for survivorship .  I explained that yearly mammograms are important for patients with intact breast tissue, and physical exams are important after mastectomy for patients that cannot undergo mammography.  I encouraged her to call if she had further questions or concerns about her healing. Otherwise, she will follow-up PRN in radiation oncology. Patient is pleased with this plan, and we will cancel her upcoming follow-up to reduce the risk of COVID-19 transmission.

## 2019-02-08 ENCOUNTER — Ambulatory Visit
Admission: RE | Admit: 2019-02-08 | Discharge: 2019-02-08 | Disposition: A | Discharge status: Home / Self Care | Payer: 59 | Source: Ambulatory Visit | Attending: Radiation Oncology | Admitting: Radiation Oncology

## 2019-02-21 ENCOUNTER — Other Ambulatory Visit: Payer: Self-pay | Admitting: Family Medicine

## 2019-02-21 MED ORDER — VITAMIN D (ERGOCALCIFEROL) 1.25 MG (50000 UNIT) PO CAPS: ORAL_CAPSULE | ORAL | 0 refills | Status: DC

## 2019-02-21 NOTE — Telephone Encounter (Signed)
Requested medication (s) are due for refill today: no  Requested medication (s) are on the active medication list: yes  Last refill: 01/27/2019  Future visit scheduled: yes  Notes to clinic:  Refill cannot be delegated    Requested Prescriptions  Pending Prescriptions Disp Refills   Vitamin D, Ergocalciferol, (DRISDOL) 1.25 MG (50000 UT) CAPS capsule 8 capsule 0    Sig: One capsule by mouth once a week for 8 weeks. Then take 2000IU/day     Endocrinology:  Vitamins - Vitamin D Supplementation Failed - 02/21/2019  2:48 PM      Failed - 50,000 IU strengths are not delegated      Failed - Phosphate in normal range and within 360 days    No results found for: PHOS       Failed - Vitamin D in normal range and within 360 days    VITD  Date Value Ref Range Status  01/25/2019 24.59 (L) 30.00 - 100.00 ng/mL Final         Passed - Ca in normal range and within 360 days    Calcium  Date Value Ref Range Status  01/25/2019 9.3 8.4 - 10.5 mg/dL Final         Passed - Valid encounter within last 12 months    Recent Outpatient Visits          3 weeks ago Annual physical exam   [facility] Jacqualine Code, MD

## 2019-02-21 NOTE — Telephone Encounter (Signed)
Vitamin D, Ergocalciferol, (DRISDOL) 1.25 MG (50000 UT) CAPS capsule    Patient requesting refill of this medication. Husband states it was prescribed by Dr. Rogers Blocker at [facility].    Pharmacy:  [facility] [address], [place] [phone] (Phone) [phone] (Fax)

## 2019-03-03 ENCOUNTER — Telehealth: Payer: Self-pay

## 2019-03-03 NOTE — Telephone Encounter (Signed)
Left vm for patient in regards to schedule message for appt (12/3) with [facility]/NP.  Asked patient to call back so we can find out what she needs and get her on the schedule.

## 2019-03-30 ENCOUNTER — Encounter: Payer: Self-pay | Admitting: *Deleted

## 2019-04-11 ENCOUNTER — Telehealth: Payer: Self-pay | Admitting: Adult Health

## 2019-04-11 NOTE — Telephone Encounter (Signed)
Scheduled per provider. Called and spoke with pt, confirmed, 3/17 appt

## 2019-05-04 ENCOUNTER — Other Ambulatory Visit: Payer: Self-pay | Admitting: Radiation Oncology

## 2019-05-04 DIAGNOSIS — Z9889 Other specified postprocedural states: Secondary | ICD-10-CM

## 2019-05-04 DIAGNOSIS — Z853 Personal history of malignant neoplasm of breast: Secondary | ICD-10-CM

## 2019-06-13 NOTE — Progress Notes (Signed)
[place]  [phone] Fax:(336) [phone]     ID: Shelley Andrews DOB: 1970/04/05  MR#: [id number]  LOV#:[id number]  Patient Care Team: Aretta Nip, MD as PCP - General (Family Medicine) Rockwell Germany, RN as Oncology Nurse Navigator Mauro Kaufmann, RN as Oncology Nurse Navigator Magrinat, Virgie Dad, MD as Consulting Physician (Oncology) Stark Klein, MD as Consulting Physician (General Surgery) Eppie Gibson, MD as Attending Physician (Radiation Oncology) Scot Dock, NP OTHER MD:  CHIEF COMPLAINT: Ductal carcinoma in situ, estrogen receptor negative  CURRENT TREATMENT: Completed adjuvant radiation therapy on 01/02/2019   INTERVAL HISTORY: Shelley Andrews returns today for follow-up and treatment of her estrogen receptor negative ductal carcinoma in situ. She completed adjuvant radiation on 01/02/2020.  She notes she has some left breast changes, since her radiation.  She has some left axillary discomfort when raising her left arm.   She has an occasional headache.  She denies any new issues.    REVIEW OF SYSTEMS: Shelley Andrews is exercising her arms regularly.  She also enjoys playing basketball with her son outdoors.  He is 10.  She is scheduled for mammogram on 09/29/2019.  She denies any other issues such as fever, chills, chest pain, palpitations, cough, bowel/bladder changes or any other concerns.  A detailed ROS was otherwise non contributory.  HISTORY OF CURRENT ILLNESS: "Shelley Andrews" presented with a palpable area of concern in the axillary tail of the right breast, as well as cyclical bilateral breast tenderness. She underwent bilateral diagnostic mammography with tomography and right breast ultrasonography at [facility] on 08/30/2018 showing: breast density category C; suspicious heterogeneous microcalcifications in a ductal distribution in the inferior central/slightly outer left breast spanning a total of 4 cm; 3.1 cm benign palpable lipoma in the  axillary tail of the right breast; no evidence of malignancy in the right breast, normal appearing lymph nodes.  Accordingly on 09/01/2018 she proceeded to biopsy of the left breast area in question. The pathology from this procedure ([id number]) showed: ductal carcinoma in situ with calcifications, grade 3 [per discussion at conference]. Prognostic indicators significant for: estrogen receptor, 0% negative and progesterone receptor, 0% negative.   The patient's subsequent history is as detailed below.   PAST MEDICAL HISTORY: Past Medical History:  Diagnosis Date  . Breast cancer ([place])   . Family history of breast cancer   . Gestational diabetes    gestational diabetes    PAST SURGICAL HISTORY: Past Surgical History:  Procedure Laterality Date  . BREAST BIOPSY Bilateral   . BREAST EXCISIONAL BIOPSY Bilateral   . BREAST LUMPECTOMY Left 09/2018  . BREAST LUMPECTOMY WITH RADIOACTIVE SEED LOCALIZATION Left 10/20/2018   Procedure: LEFT BREAST LUMPECTOMY WITH BRACKETED RADIOACTIVE SEED LOCALIZATION;  Surgeon: Stark Klein, MD;  Location: [facility];  Service: General;  Laterality: Left;  . CESAREAN SECTION      FAMILY HISTORY: Family History  Problem Relation Age of Onset  . Stroke Mother   . Lung cancer Father        unsure, possibly TB and not lung ca  . Breast cancer Maternal Aunt   . Breast cancer Sister   . Breast cancer Paternal 9    Patient's father was 20 years old when he died from lung problems. Patient is unsure if it was lung cancer or tuberculosis as he was a smoker and "had water in his lungs." Patient's mother died from a stroke at age 16. The patient notes a family hx of breast cancer in a  maternal aunt who was diagnosed with breast cancer at an older age.  The patient has 9 siblings, 2 brothers and 7 sisters.  None of them have had cancer that she knows of.  She denies a family history of ovarian cancer.   GYNECOLOGIC HISTORY:  No LMP recorded.  Her periods are  regular. Menarche: 49 years old Age at first live birth: 49 years old [place] P 1 LMP 09/02/2018, regular Contraceptive never used   SOCIAL HISTORY: (updated 09/07/2018)  Shelley Andrews originally from [place]. She is currently working in Therapist, art. She is married. Husband Shelley Andrews is a Hotel manager. He was born in [place], but they moved here due to expenses. She lives at home with her husband and son. Son Shelley Andrews is 86 years old.   ADVANCED DIRECTIVES: not in place; in the absence of any documents to the contrary her husband is her healthcare power of attorney   HEALTH MAINTENANCE: Social History   Tobacco Use  . Smoking status: Former Smoker    Packs/day: 1.00    Quit date: 07/08/2018    Years since quitting: 0.9  . Smokeless tobacco: Never Used  Substance Use Topics  . Alcohol use: Not Currently  . Drug use: Never     Colonoscopy: never done  PAP: 11/09/2011  Bone density: never done   No Known Allergies  Current Outpatient Medications  Medication Sig Dispense Refill  . Vitamin D, Ergocalciferol, (DRISDOL) 1.25 MG (50000 UT) CAPS capsule One capsule by mouth once a week for 8 weeks. Then take 2000IU/day 8 capsule 0  . ibuprofen (ADVIL) 600 MG tablet Take 1 tablet (600 mg total) by mouth every 6 (six) hours as needed. (Patient not taking: Reported on 06/14/2019) 30 tablet 0   No current facility-administered medications for this visit.    OBJECTIVE:   Vitals:   06/14/19 1617  BP: 114/67  Pulse: (!) 57  Resp: 18  Temp: 98.7 F (37.1 C)  SpO2: 100%     Body mass index is 25.9 kg/m.   Wt Readings from Last 3 Encounters:  06/14/19 137 lb 1.6 oz (62.2 kg)  01/25/19 126 lb (57.2 kg)  11/16/18 126 lb 8 oz (57.4 kg)   ECOG FS:1 - Symptomatic but completely ambulatory GENERAL: Patient is a well appearing female in no acute distress HEENT:  Sclerae anicteric.  Mask in place. Neck is supple.  NODES:  No cervical, supraclavicular, or axillary lymphadenopathy palpated.  BREAST EXAM:   Right breast benign, left breast s/p lumpectomy and radiation, no sign of local recurrence LUNGS:  Clear to auscultation bilaterally.  No wheezes or rhonchi. HEART:  Regular rate and rhythm. No murmur appreciated. ABDOMEN:  Soft, nontender.  Positive, normoactive bowel sounds. No organomegaly palpated. MSK:  No focal spinal tenderness to palpation. Full range of motion bilaterally in the upper extremities. EXTREMITIES:  No peripheral edema.   SKIN:  Clear with no obvious rashes or skin changes. No nail dyscrasia. NEURO:  Nonfocal. Well oriented.  Appropriate affect.     LAB RESULTS:  CMP     Component Value Date/Time   NA 140 01/25/2019 0849   K 3.9 01/25/2019 0849   CL 103 01/25/2019 0849   CO2 29 01/25/2019 0849   GLUCOSE 99 01/25/2019 0849   BUN 8 01/25/2019 0849   CREATININE 0.71 01/25/2019 0849   CREATININE 0.82 09/07/2018 0804   CALCIUM 9.3 01/25/2019 0849   PROT 7.1 01/25/2019 0849   ALBUMIN 4.6 01/25/2019 0849   AST 16 01/25/2019 0849  AST 19 09/07/2018 0804   ALT 17 01/25/2019 0849   ALT 20 09/07/2018 0804   ALKPHOS 56 01/25/2019 0849   BILITOT 0.6 01/25/2019 0849   BILITOT 0.6 09/07/2018 0804   GFRNONAA >60 09/07/2018 0804   GFRAA >60 09/07/2018 0804    No results found for: TOTALPROTELP, ALBUMINELP, A1GS, A2GS, BETS, BETA2SER, GAMS, MSPIKE, SPEI  No results found for: KPAFRELGTCHN, LAMBDASER, [facility]  Lab Results  Component Value Date   WBC 5.0 01/25/2019   NEUTROABS 3.5 01/25/2019   HGB 13.7 01/25/2019   HCT 40.3 01/25/2019   MCV 90.9 01/25/2019   PLT 306.0 01/25/2019    @LASTCHEMISTRY @  No results found for: LABCA2  No components found for: [id number]  No results for input(s): INR in the last 168 hours.  No results found for: LABCA2  No results found for: IPJ825  No results found for: KNL976  No results found for: BHA193  No results found for: CA2729  No components found for: HGQUANT  No results found for: CEA1 / No results  found for: CEA1   No results found for: AFPTUMOR  No results found for: CHROMOGRNA  No results found for: PSA1  No visits with results within 3 Day(s) from this visit.  Latest known visit with results is:  Office Visit on 01/25/2019  Component Date Value Ref Range Status  . Sodium 01/25/2019 140  135 - 145 mEq/L Final  . Potassium 01/25/2019 3.9  3.5 - 5.1 mEq/L Final  . Chloride 01/25/2019 103  96 - 112 mEq/L Final  . CO2 01/25/2019 29  19 - 32 mEq/L Final  . Glucose, Bld 01/25/2019 99  70 - 99 mg/dL Final  . BUN 01/25/2019 8  6 - 23 mg/dL Final  . Creatinine, Ser 01/25/2019 0.71  0.40 - 1.20 mg/dL Final  . Total Bilirubin 01/25/2019 0.6  0.2 - 1.2 mg/dL Final  . Alkaline Phosphatase 01/25/2019 56  39 - 117 U/L Final  . AST 01/25/2019 16  0 - 37 U/L Final  . ALT 01/25/2019 17  0 - 35 U/L Final  . Total Protein 01/25/2019 7.1  6.0 - 8.3 g/dL Final  . Albumin 01/25/2019 4.6  3.5 - 5.2 g/dL Final  . Calcium 01/25/2019 9.3  8.4 - 10.5 mg/dL Final  . GFR 01/25/2019 87.27  >60.00 mL/min Final  . WBC 01/25/2019 5.0  4.0 - 10.5 K/uL Final  . RBC 01/25/2019 4.43  3.87 - 5.11 Mil/uL Final  . Hemoglobin 01/25/2019 13.7  12.0 - 15.0 g/dL Final  . HCT 01/25/2019 40.3  36.0 - 46.0 % Final  . MCV 01/25/2019 90.9  78.0 - 100.0 fl Final  . MCHC 01/25/2019 34.0  30.0 - 36.0 g/dL Final  . RDW 01/25/2019 12.9  11.5 - 15.5 % Final  . Platelets 01/25/2019 306.0  150.0 - 400.0 K/uL Final  . Neutrophils Relative % 01/25/2019 69.5  43.0 - 77.0 % Final  . Lymphocytes Relative 01/25/2019 23.6  12.0 - 46.0 % Final  . Monocytes Relative 01/25/2019 5.1  3.0 - 12.0 % Final  . Eosinophils Relative 01/25/2019 1.1  0.0 - 5.0 % Final  . Basophils Relative 01/25/2019 0.7  0.0 - 3.0 % Final  . Neutro Abs 01/25/2019 3.5  1.4 - 7.7 K/uL Final  . Lymphs Abs 01/25/2019 1.2  0.7 - 4.0 K/uL Final  . Monocytes Absolute 01/25/2019 0.3  0.1 - 1.0 K/uL Final  . Eosinophils Absolute 01/25/2019 0.1  0.0 - 0.7 K/uL Final   .  Basophils Absolute 01/25/2019 0.0  0.0 - 0.1 K/uL Final  . TSH 01/25/2019 1.72  0.35 - 4.50 uIU/mL Final  . Cholesterol 01/25/2019 193  0 - 200 mg/dL Final   ATP III Classification       Desirable:  < 200 mg/dL               Borderline High:  200 - 239 mg/dL          High:  > = 240 mg/dL  . Triglycerides 01/25/2019 125.0  0.0 - 149.0 mg/dL Final   Normal:  <150 mg/dLBorderline High:  150 - 199 mg/dL  . HDL 01/25/2019 65.40  >39.00 mg/dL Final  . VLDL 01/25/2019 25.0  0.0 - 40.0 mg/dL Final  . LDL Cholesterol 01/25/2019 103* 0 - 99 mg/dL Final  . Total CHOL/HDL Ratio 01/25/2019 3   Final                  Men          Women1/2 Average Risk     3.4          3.3Average Risk          5.0          4.42X Average Risk          9.6          7.13X Average Risk          15.0          11.0                      . NonHDL 01/25/2019 127.83   Final   NOTE:  Non-HDL goal should be 30 mg/dL higher than patient's LDL goal (i.e. LDL goal of < 70 mg/dL, would have non-HDL goal of < 100 mg/dL)  . VITD 01/25/2019 24.59* 30.00 - 100.00 ng/mL Final  . HIV 1&2 Ab, 4th Generation 01/25/2019 NON-REACTIVE  NON-REACTI Final   Comment: HIV-1 antigen and HIV-1/HIV-2 antibodies were not detected. There is no laboratory evidence of HIV infection. Marland Kitchen PLEASE NOTE: This information has been disclosed to you from records whose confidentiality may be protected by state law.  If your state requires such protection, then the state law prohibits you from making any further disclosure of the information without the specific written consent of the person to whom it pertains, or as otherwise permitted by law. A general authorization for the release of medical or other information is NOT sufficient for this purpose. . For additional information please refer to http://education.questdiagnostics.com/faq/FAQ106 (This link is being provided for informational/ educational purposes only.) . Marland Kitchen The performance of this assay has not  been clinically validated in patients less than 87 years old. .   . Hgb A1c MFr Bld 01/25/2019 6.0  4.6 - 6.5 % Final   Glycemic Control Guidelines for People with Diabetes:Non Diabetic:  <6%Goal of Therapy: <7%Additional Action Suggested:  >8%     (this displays the last labs from the last 3 days)  No results found for: TOTALPROTELP, ALBUMINELP, A1GS, A2GS, BETS, BETA2SER, GAMS, MSPIKE, SPEI (this displays SPEP labs)  No results found for: KPAFRELGTCHN, LAMBDASER, KAPLAMBRATIO (kappa/lambda light chains)  No results found for: HGBA, HGBA2QUANT, HGBFQUANT, HGBSQUAN (Hemoglobinopathy evaluation)   No results found for: LDH  No results found for: IRON, TIBC, IRONPCTSAT (Iron and TIBC)  No results found for: FERRITIN  Urinalysis No results found for: COLORURINE, APPEARANCEUR, LABSPEC, [place], GLUCOSEU, HGBUR, BILIRUBINUR, KETONESUR, PROTEINUR, UROBILINOGEN,  NITRITE, LEUKOCYTESUR   STUDIES: No results found.  ELIGIBLE FOR AVAILABLE RESEARCH PROTOCOL:no  ASSESSMENT: 49 y.o. [place], [place] status post left breast biopsy 09/01/2022 ductal carcinoma in situ, grade 3, measuring clinically 4.1 cm, estrogen and progesterone receptor negative  (1) status post left lumpectomy 10/20/2018 for ductal carcinoma in situ, intermediate nuclear grade, with close but negative margins  (2) adjuvant radiation 12/06/2018-01/02/2020  SITE/DOSE:   1) left breast/ 40.05Gy in 15 fx    2) left breast boost / 10 Gy in 5 fx    (3) genetics testing drawn 09/13/2018 through the [facility] Common Hereditary Cancers Panel.found no deleterious mutations in APC, ATM, AXIN2, BARD1, BMPR1A, BRCA1, BRCA2, BRIP1, CDH1, CDKN2A (p14ARF), CDKN2A (p16INK4a), CKD4, CHEK2, CTNNA1, DICER1, EPCAM (Deletion/duplication testing only), GREM1 (promoter region deletion/duplication testing only), KIT, MEN1, MLH1, MSH2, MSH3, MSH6, MUTYH, NBN, NF1, NHTL1, PALB2, PDGFRA, PMS2, POLD1, POLE, PTEN, RAD50, RAD51C, RAD51D, SDHB, SDHC, SDHD,  SMAD4, SMARCA4. STK11, TP53, TSC1, TSC2, and VHL.  The following genes were evaluated for sequence changes only: SDHA and HOXB13 c.251G>A variant only. The report date is 09/13/2018.   PLAN: Shelley Andrews has no sign of local recurrence.  She is healing well from radiation therapy.  She is due for her mammogram and I placed orders for that today.  Being that her DCIS is ER/PR positive, she does not need any further adjuvant therapy.    Shelley Andrews and I talked about her left axillary discomfort.  I offered to refer her to Physical therapy to help with this and evaluate and treat.  She declined.  She is going to work on exercises at home.    Shelley Andrews and I talked about the benefit of healthy diet and exercise.    She will return in August, after her July mammogram for f/u with Dr. Jana Hakim.  She was recommended to continue with the appropriate pandemic precautions.  She knows to call for any questions that may arise between now and her next appointment.  We are happy to see her sooner if needed..  Total encounter time: 30 minutes*  Wilber Bihari, NP 06/14/19 4:40 PM Medical Oncology and Hematology [facility] [place], [place] [address] Tel. [phone]    Fax. [phone]  *Total Encounter Time as defined by the Centers for Medicare and Medicaid Services includes, in addition to the face-to-face time of a patient visit (documented in the note above) non-face-to-face time: obtaining and reviewing outside history, ordering and reviewing medications, tests or procedures, care coordination (communications with other health care professionals or caregivers) and documentation in the medical record.

## 2019-06-14 ENCOUNTER — Inpatient Hospital Stay: Payer: 59 | Attending: Adult Health | Admitting: Adult Health

## 2019-06-14 ENCOUNTER — Other Ambulatory Visit: Payer: Self-pay

## 2019-06-14 VITALS — BP 114/67 | HR 57 | Temp 98.7°F | Resp 18 | Ht 61.0 in | Wt 137.1 lb

## 2019-06-14 DIAGNOSIS — D0512 Intraductal carcinoma in situ of left breast: Secondary | ICD-10-CM | POA: Diagnosis present

## 2019-06-14 DIAGNOSIS — M79622 Pain in left upper arm: Secondary | ICD-10-CM | POA: Diagnosis not present

## 2019-06-14 DIAGNOSIS — Z87891 Personal history of nicotine dependence: Secondary | ICD-10-CM | POA: Diagnosis not present

## 2019-06-14 DIAGNOSIS — Z923 Personal history of irradiation: Secondary | ICD-10-CM | POA: Insufficient documentation

## 2019-06-14 NOTE — Progress Notes (Signed)
[facility] Radiation Oncology End of Treatment Note  Name:Shelley Andrews  Date: 01/02/2019 MRN:[id number] DOB:05/08/1970   Status:outpatient    CC: Rankins, Victoria R, MD      DIAGNOSIS:   1. Ductal carcinoma in situ (DCIS) of left breast  D05.12       Cancer Staging Ductal carcinoma in situ (DCIS) of left breast Staging form: Breast, AJCC 8th Edition - Clinical stage from 09/07/2018: Stage 0 (cTis (DCIS), cN0, cM0, ER-, PR-, HER2: Not Assessed) - Unsigned     - Pathologic Stage 0 (pTis, pNx) Left Breast Ductal Carcinoma In Situ, ER(-) / PR(-), Intermediate-High Grad   INDICATION FOR TREATMENT: Curative   TREATMENT DATES:  12-06-18 to 01-02-19                          SITE/DOSE:              1) left breast/ 40.05Gy in 15 fx 2) left breast boost / 10 Gy in 5 fx               BEAMS/ENERGY:       1) 3D / 6X and 10X          2) 3D / 6X and 10X    NARRATIVE:  She tolerated radiotherapy relatively well.                     PLAN: Routine followup in one month. Patient instructed to call if she develops questions or worsening complaints in interim.  -----------------------------------  Sarah Squire, MD                                         

## 2019-06-15 ENCOUNTER — Ambulatory Visit: Payer: 59 | Admitting: Adult Health

## 2019-06-16 ENCOUNTER — Telehealth: Payer: Self-pay | Admitting: Adult Health

## 2019-06-16 ENCOUNTER — Encounter: Payer: Self-pay | Admitting: Adult Health

## 2019-06-16 NOTE — Telephone Encounter (Signed)
Scheduled appt per 3/17 los. Pt could only schedule fu with GM and stated that she was unable to schedule SCP visit with [facility] because she couldn't get off work. Sent [facility] a staff msg explaining this. Pt confirmed appt date and time with GM.

## 2019-06-27 ENCOUNTER — Telehealth: Payer: Self-pay | Admitting: Adult Health

## 2019-06-27 NOTE — Telephone Encounter (Signed)
Scheduled appt per 3/17 los. Left voicemail with new appt details. Mailed appt reminder and calendar.

## 2019-07-28 ENCOUNTER — Other Ambulatory Visit: Payer: Self-pay | Admitting: Family Medicine

## 2019-07-28 DIAGNOSIS — D0512 Intraductal carcinoma in situ of left breast: Secondary | ICD-10-CM

## 2019-09-29 ENCOUNTER — Other Ambulatory Visit: Payer: Self-pay

## 2019-09-29 ENCOUNTER — Ambulatory Visit
Admission: RE | Admit: 2019-09-29 | Discharge: 2019-09-29 | Disposition: A | Discharge status: Home / Self Care | Payer: 59 | Source: Ambulatory Visit | Attending: Radiation Oncology | Admitting: Radiation Oncology

## 2019-09-29 DIAGNOSIS — Z9889 Other specified postprocedural states: Secondary | ICD-10-CM

## 2019-09-29 DIAGNOSIS — Z853 Personal history of malignant neoplasm of breast: Secondary | ICD-10-CM

## 2019-09-29 HISTORY — DX: Personal history of irradiation: Z92.3

## 2019-09-29 IMAGING — MG DIGITAL DIAGNOSTIC BILAT W/ TOMO W/ CAD
6 of 9 series · 6 of 25 positions shown · non-contrast
Comparison: Previous exam(s).

CLINICAL DATA: History of LEFT breast lumpectomy for DCIS in [7T].

EXAM:
DIGITAL DIAGNOSTIC BILATERAL MAMMOGRAM WITH TOMO AND CAD

[L CC]
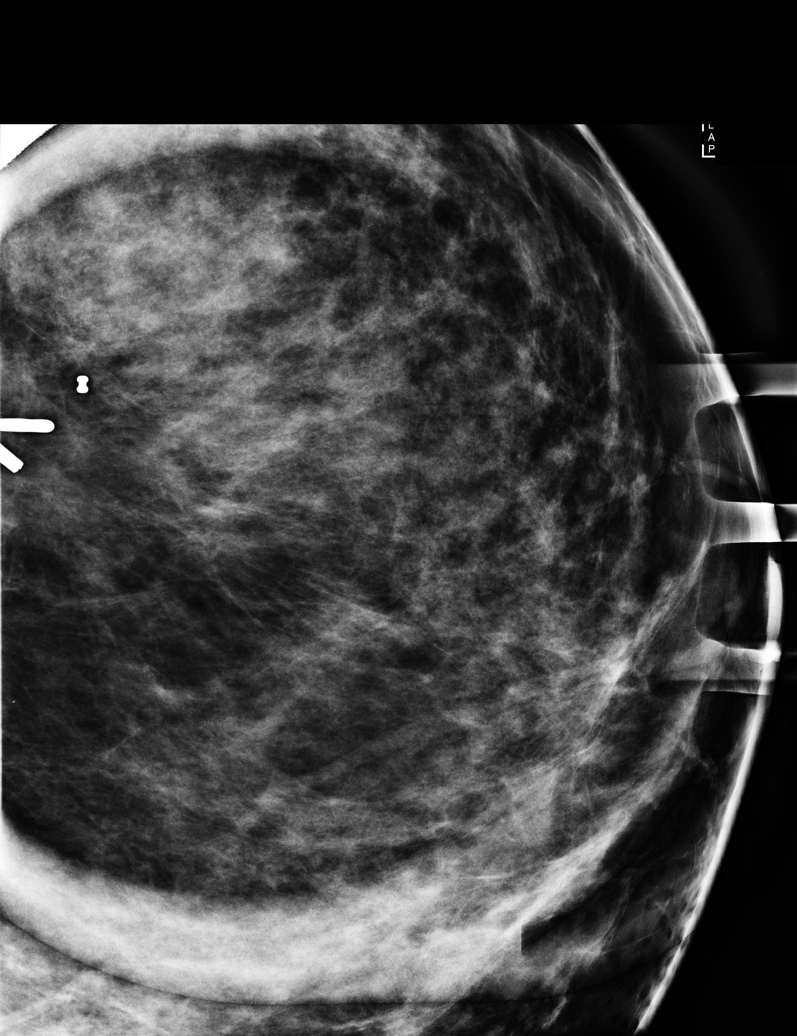

[R CC synth-2D]
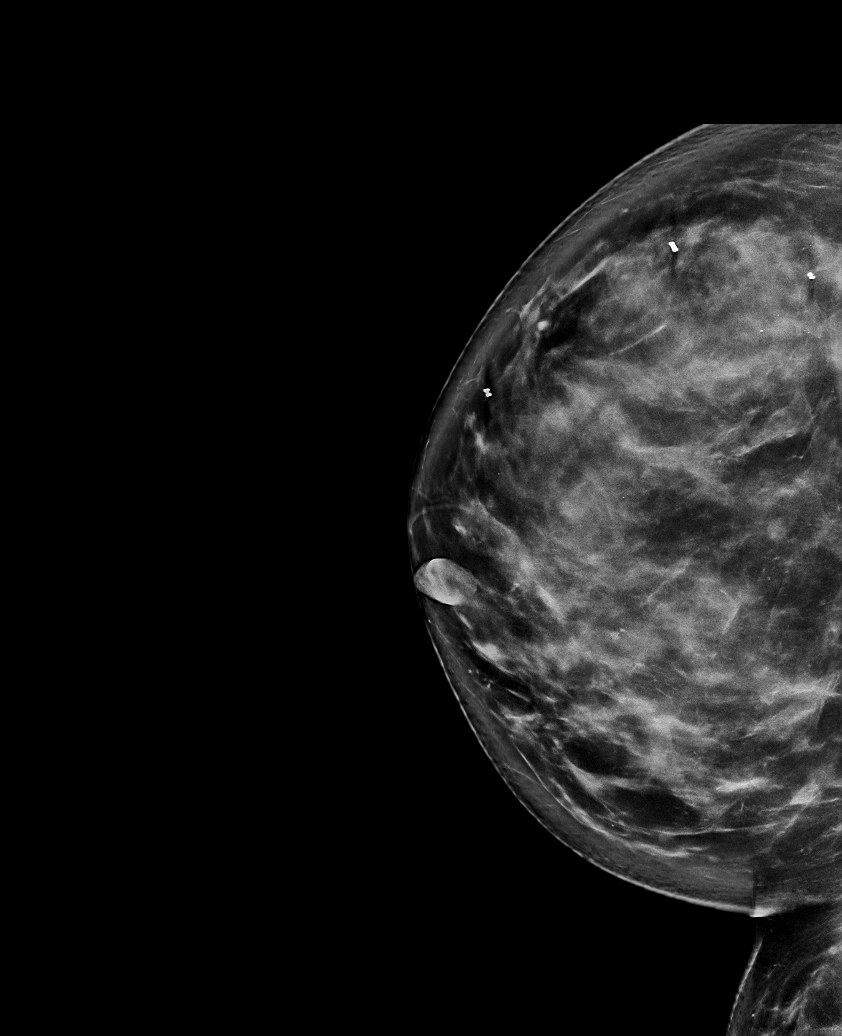

[R MLO synth-2D]
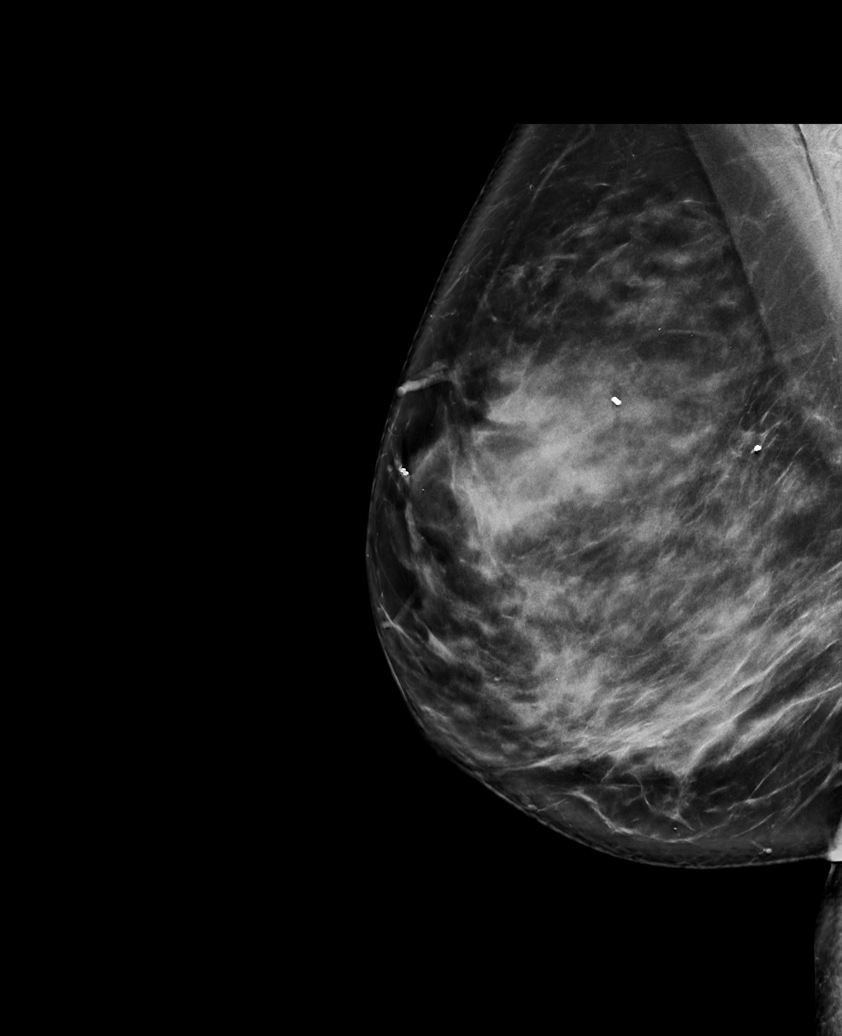

[L MLO synth-2D]
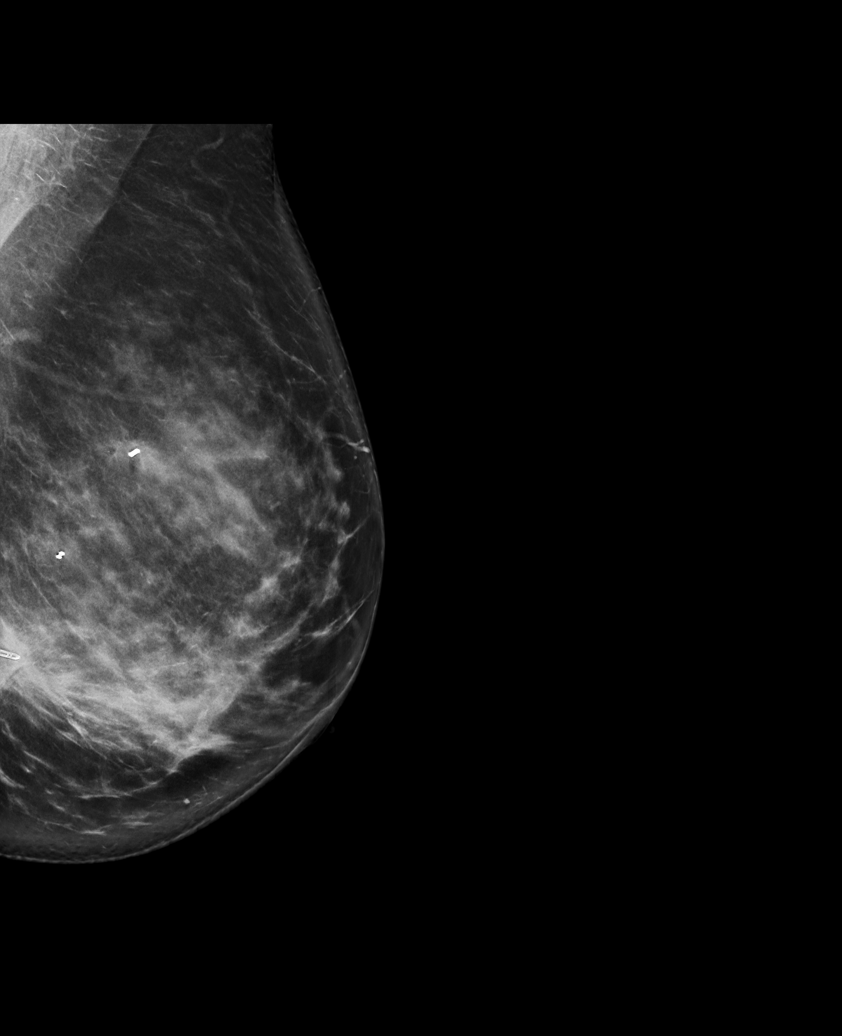

[L CC synth-2D]
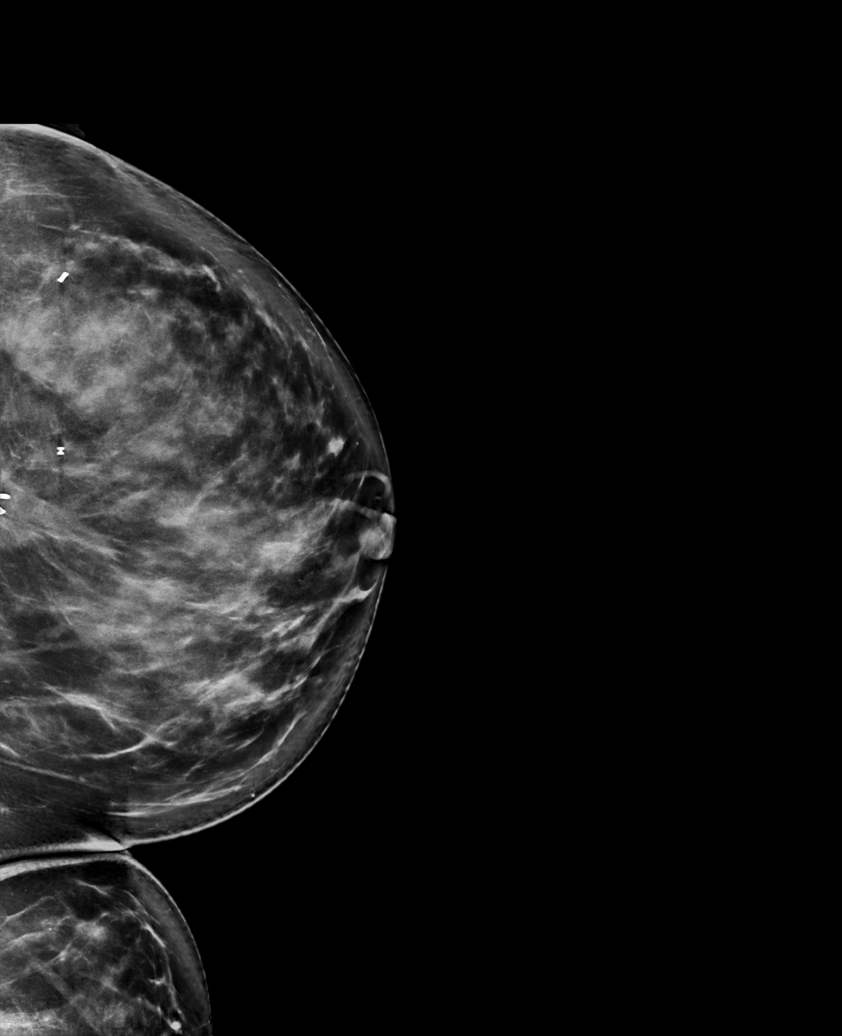

[R CC tomo · tomo slice 53/105.0]
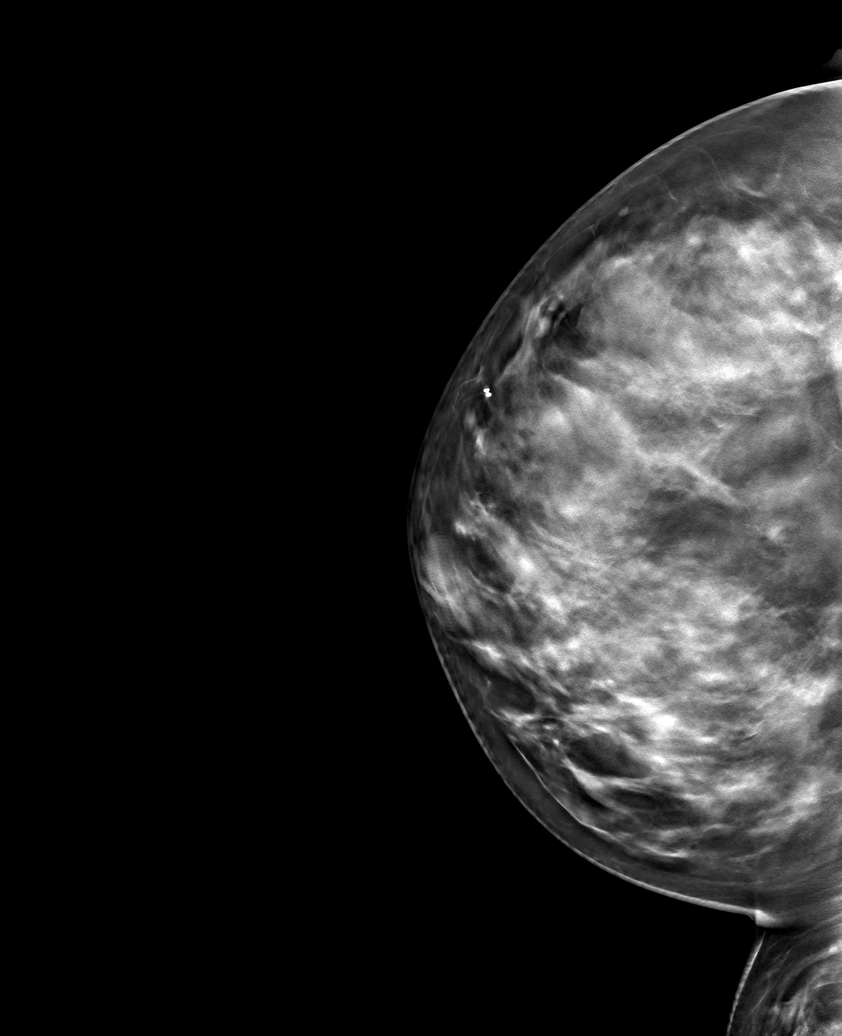

[6 of 25 positions shown; findings below may reference images not displayed]

ACR Breast Density Category c: The breast tissue is heterogeneously
dense, which may obscure small masses.
FINDINGS: There are expected postsurgical changes within the LEFT breast.
There are no new dominant masses, suspicious calcifications or
secondary signs of malignancy within either breast.

Mammographic images were processed with CAD.
IMPRESSION: No evidence of malignancy within either breast. Expected
postsurgical changes within the LEFT breast.

RECOMMENDATION:
Bilateral diagnostic mammogram in 1 year.

I have discussed the findings and recommendations with the patient.
If applicable, a reminder letter will be sent to the patient
regarding the next appointment.

BI-RADS CATEGORY  2: Benign.

## 2019-10-30 ENCOUNTER — Telehealth: Payer: Self-pay | Admitting: Oncology

## 2019-10-30 NOTE — Telephone Encounter (Signed)
Rescheduled appts per provider PAL. Left voicemail with cancellation and new appt details.  

## 2019-11-03 ENCOUNTER — Ambulatory Visit (INDEPENDENT_AMBULATORY_CARE_PROVIDER_SITE_OTHER): Payer: 59 | Admitting: Family Medicine

## 2019-11-03 ENCOUNTER — Encounter: Payer: Self-pay | Admitting: Family Medicine

## 2019-11-03 ENCOUNTER — Other Ambulatory Visit: Payer: Self-pay

## 2019-11-03 VITALS — BP 98/60 | HR 70 | Temp 98.4°F | Resp 18 | Ht 60.0 in | Wt 135.2 lb

## 2019-11-03 DIAGNOSIS — L989 Disorder of the skin and subcutaneous tissue, unspecified: Secondary | ICD-10-CM

## 2019-11-03 DIAGNOSIS — E559 Vitamin D deficiency, unspecified: Secondary | ICD-10-CM

## 2019-11-03 DIAGNOSIS — Z1159 Encounter for screening for other viral diseases: Secondary | ICD-10-CM

## 2019-11-03 DIAGNOSIS — R7303 Prediabetes: Secondary | ICD-10-CM | POA: Diagnosis not present

## 2019-11-03 DIAGNOSIS — G44209 Tension-type headache, unspecified, not intractable: Secondary | ICD-10-CM

## 2019-11-03 DIAGNOSIS — M542 Cervicalgia: Secondary | ICD-10-CM

## 2019-11-03 MED ORDER — BACLOFEN 10 MG PO TABS
10.0000 mg | ORAL_TABLET | Freq: Three times a day (TID) | ORAL | 0 refills | Status: DC
Start: 2019-11-03 — End: 2020-12-12

## 2019-11-03 NOTE — Patient Instructions (Addendum)
You are very tight on your trapezius and likely cause of your headaches. 1) heating pad on your neck 2) would get a stand up desk to help posture as this could be contributing cause for your neck pain 3) muscle relaxer prn (may make you sleepy) 4) GET  A MASSAGE!!!!   Dry mouth -start flonase nightly -make sure not too dry in the house -I want to see you back in 1-3 months to check area in mouth.   Prediabetes -checking labs today   Tension Headache, Adult A tension headache is pain, pressure, or aching in your head. Tension headaches can last from 30 minutes to several days. Follow these instructions at home: Managing pain  Take over-the-counter and prescription medicines only as told by your doctor.  When you have a headache, lie down in a dark, quiet room.  If told, put ice on your head and neck: ? Put ice in a plastic bag. ? Place a towel between your skin and the bag. ? Leave the ice on for 20 minutes, 2-3 times a day.  If told, put heat on the back of your neck. Do this as often as your doctor tells you to. Use the kind of heat that your doctor recommends, such as a moist heat pack or a heating pad. ? Place a towel between your skin and the heat. ? Leave the heat on for 20-30 minutes. ? Remove the heat if your skin turns bright red. Eating and drinking  Eat meals on a regular schedule.  Watch how much alcohol you drink: ? If you are a woman and are not pregnant, do not drink more than 1 drink a day. ? If you are a man, do not drink more than 2 drinks a day.  Drink enough fluid to keep your pee (urine) pale yellow.  Do not use a lot of caffeine, or stop using caffeine. Lifestyle  Get enough sleep. Get 7-9 hours of sleep each night. Or get the amount of sleep that your doctor tells you to.  At bedtime, remove all electronic devices from your room. Examples of electronic devices are computers, phones, and tablets.  Find ways to lessen your stress. Some things that  can lessen stress are: ? Exercise. ? Deep breathing. ? Yoga. ? Music. ? Positive thoughts.  Sit up straight. Do not tighten (tense) your muscles.  Do not use any products that have nicotine or tobacco in them, such as cigarettes and e-cigarettes. If you need help quitting, ask your doctor. General instructions   Keep all follow-up visits as told by your doctor. This is important.  Avoid things that can bring on headaches. Keep a journal to find out if certain things bring on headaches. For example, write down: ? What you eat and drink. ? How much sleep you get. ? Any change to your diet or medicines. Contact a doctor if:  Your headache does not get better.  Your headache comes back.  You have a headache and sounds, light, or smells bother you.  You feel sick to your stomach (nauseous) or you throw up (vomit).  Your stomach hurts. Get help right away if:  You suddenly get a very bad headache along with any of these: ? A stiff neck. ? Feeling sick to your stomach. ? Throwing up. ? Feeling weak. ? Trouble seeing. ? Feeling short of breath. ? A rash. ? Feeling unusually sleepy. ? Trouble speaking. ? Pain in your eye or ear. ? Trouble walking or  balancing. ? Feeling like you will pass out (faint). ? Passing out. Summary  A tension headache is pain, pressure, or aching in your head.  Tension headaches can last from 30 minutes to several days.  Lifestyle changes and medicines may help relieve pain. This information is not intended to replace advice given to you by your health care provider. Make sure you discuss any questions you have with your health care provider. Document Revised: 01/11/2019 Document Reviewed: 06/26/2016 Elsevier Patient Education  [place].

## 2019-11-03 NOTE — Progress Notes (Signed)
Patient: Shelley Andrews MRN: [id number] DOB: [birth year] PCP: Orma Flaming, MD     Subjective:  Chief Complaint  Patient presents with  . Dry Throat, Neck/shoulder pain    Comes and goes. She also mentioned that she has had some headaches with her neck and shoulder. Started a month ago.   . face lesion  . Prediabetes  . vitamin d deficiency    HPI: The patient is a 49 y.o. female who presents today for multiple complaints including dry throat, neck and shoulder pain and headaches. And a lesion on her face.    Dry throat Started about 2 days ago. She states she is drinking water. Denies any dry eyes. They have a dehumidifer and air purifier in their room. She does use nasal spray prn. No issues swallowing food or liquid.   Neck and shoulder pain Located on her left side of her neck and over bilatearal trapezius. She states everytime she wakes up in tights in this area. This has been going on > 1 month. She denies any new bed/mattresses. No new pillows. Denies any new stress/changes. She sits and types all day. Never had any massages.  She has full ROM in her neck with no pain with movement. She has headaches at times from this. Headaches are on back of head/occipital area. She gets headache about every week and can last all day. Feels heavy. She does take one ibuprofen and this does help relieve the headache. Pain rated as 8/10.   Lesion on face Has had a raised, pink colored lesion on her right forehead for many years. She states she saw a dermatologist in [place] who told her it was fine, but it has been growing her and bothering her recently. No bleeding/crusting. NO hx of skin cancer. Does have a lot of sun exposure growing up in [place].   Prediabetes Also has not returned for follow up as she needed to for her labs.   Review of Systems  Constitutional: Negative for chills, fatigue and fever.  HENT: Negative for dental problem, ear pain, hearing loss and trouble swallowing.    Eyes: Negative for visual disturbance.  Respiratory: Negative for cough, chest tightness and shortness of breath.   Cardiovascular: Negative for chest pain, palpitations and leg swelling.  Gastrointestinal: Negative for abdominal pain, blood in stool, diarrhea and nausea.  Endocrine: Negative for cold intolerance, polydipsia, polyphagia and polyuria.  Genitourinary: Negative for dysuria and hematuria.  Musculoskeletal: Positive for neck pain. Negative for arthralgias.  Skin: Negative for rash.       Lesion on her face   Neurological: Positive for headaches. Negative for dizziness.  Psychiatric/Behavioral: Negative for dysphoric mood and sleep disturbance. The patient is not nervous/anxious.     Allergies Patient has No Known Allergies.  Past Medical History Patient  has a past medical history of Breast cancer ([place]), Family history of breast cancer, Gestational diabetes, and Personal history of radiation therapy.  Surgical History Patient  has a past surgical history that includes Cesarean section; Breast lumpectomy with radioactive seed localization (Left, 10/20/2018); Breast biopsy (Bilateral); Breast excisional biopsy (Bilateral); and Breast lumpectomy (Left, 09/2018).  Family History Pateint's family history includes Breast cancer in her maternal aunt, paternal aunt, and sister; Lung cancer in her father; Stroke in her mother.  Social History Patient  reports that she quit smoking about 15 months ago. She smoked 1.00 pack per day. She has never used smokeless tobacco. She reports previous alcohol use. She reports that she does not  use drugs.    Objective: Vitals:   11/03/19 0818  BP: 98/60  Pulse: 70  Resp: 18  Temp: 98.4 F (36.9 C)  TempSrc: Temporal  SpO2: 97%  Weight: 135 lb 3.2 oz (61.3 kg)  Height: 5' (1.524 m)    Body mass index is 26.4 kg/m.  Physical Exam Vitals reviewed.  Constitutional:      Appearance: Normal appearance. She is well-developed and normal  weight.  HENT:     Head: Normocephalic and atraumatic.     Right Ear: Tympanic membrane, ear canal and external ear normal.     Left Ear: Tympanic membrane, ear canal and external ear normal.  Eyes:     Conjunctiva/sclera: Conjunctivae normal.     Pupils: Pupils are equal, round, and reactive to light.  Neck:     Thyroid: No thyromegaly.  Cardiovascular:     Rate and Rhythm: Normal rate and regular rhythm.     Heart sounds: Normal heart sounds. No murmur heard.   Pulmonary:     Effort: Pulmonary effort is normal.     Breath sounds: Normal breath sounds.  Abdominal:     General: Bowel sounds are normal. There is no distension.     Palpations: Abdomen is soft.     Tenderness: There is no abdominal tenderness.  Musculoskeletal:     Cervical back: Normal range of motion and neck supple. Tenderness (over bilateral trapezius muscles ) present.  Lymphadenopathy:     Cervical: No cervical adenopathy.  Skin:    General: Skin is warm and dry.     Findings: Lesion (right lateral forehead. dome shaped, flesh colored. telangiecstasia) present. No rash.  Neurological:     General: No focal deficit present.     Mental Status: She is alert and oriented to person, place, and time.     Cranial Nerves: No cranial nerve deficit.     Sensory: No sensory deficit.     Coordination: Coordination normal.     Deep Tendon Reflexes: Reflexes normal.  Psychiatric:        Mood and Affect: Mood normal.        Behavior: Behavior normal.        Assessment/plan: 1. Tension headache From tension in her trapezius muscles. Discussed massages, getting a standing desk so her posture is not contributing to her neck pain (very common), heating pad, stretching. Can take ibuprofen or tylenol for headaches. Let me know if not getting better.   2. Prediabetes  - Hemoglobin A1c; Future - Comprehensive metabolic panel; Future - CBC with Differential/Platelet; Future - CBC with Differential/Platelet -  Comprehensive metabolic panel - Hemoglobin A1c  3. Vitamin D deficiency  - VITAMIN D 25 Hydroxy (Vit-D Deficiency, Fractures); Future - VITAMIN D 25 Hydroxy (Vit-D Deficiency, Fractures)  4. Encounter for hepatitis C screening test for low risk patient  - Hepatitis C antibody; Future - Hepatitis C antibody  5. Skin lesion Concern for BCC. Referral to derm.  - Ambulatory referral to Dermatology  6. Neck pain Secondary to muscle tension in her trapezius. Recommended massage, heating pad, standing desk to work on posture form strain of looking down all day. Will do trial of muscle relaxer as well. Drowsy precautions given. If not better she is to let me know so we can get xray/refer to PT.   Total time of encounter: 50 minutes total time of encounter, including 40 minutes spent in face-to-face patient care. This time includes coordination of care and counseling regarding multiple complaints  and work up involved. Remainder of non-face-to-face time involved reviewing chart documents/testing relevant to the patient encounter and documentation in the medical record.   This visit occurred during the SARS-CoV-2 public health emergency.  Safety protocols were in place, including screening questions prior to the visit, additional usage of staff PPE, and extensive cleaning of exam room while observing appropriate contact time as indicated for disinfecting solutions.     Return in about 1 month (around 12/04/2019) for recheck mouth/pap smear .   Orma Flaming, MD [place]   11/03/2019

## 2019-11-04 LAB — CBC WITH DIFFERENTIAL/PLATELET
Absolute Monocytes: 390 cells/uL (ref 200–950)
Basophils Absolute: 33 cells/uL (ref 0–200)
Basophils Relative: 0.5 %
Eosinophils Absolute: 52 cells/uL (ref 15–500)
Eosinophils Relative: 0.8 %
HCT: 40.5 % (ref 35.0–45.0)
Hemoglobin: 13.8 g/dL (ref 11.7–15.5)
Lymphs Abs: 1443 cells/uL (ref 850–3900)
MCH: 30.3 pg (ref 27.0–33.0)
MCHC: 34.1 g/dL (ref 32.0–36.0)
MCV: 88.8 fL (ref 80.0–100.0)
MPV: 10.3 fL (ref 7.5–12.5)
Monocytes Relative: 6 %
Neutro Abs: 4583 cells/uL (ref 1500–7800)
Neutrophils Relative %: 70.5 %
Platelets: 315 10*3/uL (ref 140–400)
RBC: 4.56 10*6/uL (ref 3.80–5.10)
RDW: 12.5 % (ref 11.0–15.0)
Total Lymphocyte: 22.2 %
WBC: 6.5 10*3/uL (ref 3.8–10.8)

## 2019-11-04 LAB — COMPREHENSIVE METABOLIC PANEL
AG Ratio: 1.7 (calc) (ref 1.0–2.5)
ALT: 24 U/L (ref 6–29)
AST: 19 U/L (ref 10–35)
Albumin: 4.5 g/dL (ref 3.6–5.1)
Alkaline phosphatase (APISO): 55 U/L (ref 37–153)
BUN: 10 mg/dL (ref 7–25)
CO2: 24 mmol/L (ref 20–32)
Calcium: 9.3 mg/dL (ref 8.6–10.4)
Chloride: 106 mmol/L (ref 98–110)
Creat: 0.75 mg/dL (ref 0.50–1.05)
Globulin: 2.6 g/dL (calc) (ref 1.9–3.7)
Glucose, Bld: 113 mg/dL — ABNORMAL HIGH (ref 65–99)
Potassium: 4.2 mmol/L (ref 3.5–5.3)
Sodium: 139 mmol/L (ref 135–146)
Total Bilirubin: 0.6 mg/dL (ref 0.2–1.2)
Total Protein: 7.1 g/dL (ref 6.1–8.1)

## 2019-11-04 LAB — HEMOGLOBIN A1C
Hgb A1c MFr Bld: 6.3 % of total Hgb — ABNORMAL HIGH (ref <5.7)
Mean Plasma Glucose: 134 (calc)
eAG (mmol/L): 7.4 (calc)

## 2019-11-04 LAB — VITAMIN D 25 HYDROXY (VIT D DEFICIENCY, FRACTURES): Vit D, 25-Hydroxy: 28 ng/mL — ABNORMAL LOW (ref 30–100)

## 2019-11-06 LAB — HEPATITIS C ANTIBODY
Hepatitis C Ab: NONREACTIVE
SIGNAL TO CUT-OFF: 0.02 (ref <1.00)

## 2019-11-14 ENCOUNTER — Ambulatory Visit: Payer: 59 | Admitting: Oncology

## 2019-11-16 ENCOUNTER — Ambulatory Visit: Payer: 59 | Admitting: Adult Health

## 2019-12-22 ENCOUNTER — Ambulatory Visit: Payer: 59 | Admitting: Family Medicine

## 2020-01-24 ENCOUNTER — Ambulatory Visit: Payer: 59 | Admitting: Family Medicine

## 2020-02-14 ENCOUNTER — Encounter: Payer: 59 | Admitting: Adult Health

## 2020-08-02 ENCOUNTER — Ambulatory Visit: Payer: 59 | Admitting: Family Medicine

## 2020-08-02 ENCOUNTER — Encounter: Payer: Self-pay | Admitting: Family Medicine

## 2020-08-02 ENCOUNTER — Other Ambulatory Visit: Payer: Self-pay

## 2020-08-02 VITALS — BP 100/60 | HR 88 | Temp 98.6°F | Ht 60.0 in | Wt 133.8 lb

## 2020-08-02 DIAGNOSIS — M940 Chondrocostal junction syndrome [Tietze]: Secondary | ICD-10-CM | POA: Diagnosis not present

## 2020-08-02 DIAGNOSIS — Z01419 Encounter for gynecological examination (general) (routine) without abnormal findings: Secondary | ICD-10-CM

## 2020-08-02 DIAGNOSIS — N644 Mastodynia: Secondary | ICD-10-CM | POA: Diagnosis not present

## 2020-08-02 NOTE — Progress Notes (Signed)
Patient: Shelley Andrews MRN: [id number] DOB: [birth year] PCP: Orma Flaming, MD     Subjective:  Chief Complaint  Patient presents with  . left breast pain    HPI: The patient is a 50 y.o. female who presents today for left sided breast pain. UTD on her mmg. Last done 72021. Hx of breast cancer DCIS 2 years ago s/p lumpectomy on this side. She doesn't remember when it started, but she thinks around January of this year. She staes if she twits or turn, or picks up something heavy she has cramping on the left side of her breast. She doesn't feel a lump, but she doesn't do self exams. She doesn't check her underarm so unsure if any lumps. Pain has not gotten any worse. She feels like someone is twisting her muscle and goes away when she stops offending movement. She has not taken any medication b/c pain is so fleeting.   She also just missed her cycle and it's the first time and wants to ask about menopause.   Review of Systems  Constitutional: Negative for chills, fatigue and fever.  HENT: Negative for dental problem, ear pain, hearing loss and trouble swallowing.   Eyes: Negative for visual disturbance.  Respiratory: Negative for cough, chest tightness and shortness of breath.   Cardiovascular: Negative for chest pain, palpitations and leg swelling.  Gastrointestinal: Negative for abdominal pain, blood in stool, diarrhea and nausea.  Endocrine: Negative for cold intolerance, polydipsia, polyphagia and polyuria.  Genitourinary: Negative for dysuria and hematuria.  Musculoskeletal: Negative for arthralgias.  Skin: Negative for rash.       Pain in left beast   Neurological: Negative for dizziness and headaches.  Psychiatric/Behavioral: Negative for dysphoric mood and sleep disturbance. The patient is not nervous/anxious.     Allergies Patient has No Known Allergies.  Past Medical History Patient  has a past medical history of Breast cancer ([place]), Family history of breast cancer,  Gestational diabetes, and Personal history of radiation therapy.  Surgical History Patient  has a past surgical history that includes Cesarean section; Breast lumpectomy with radioactive seed localization (Left, 10/20/2018); Breast biopsy (Bilateral); Breast excisional biopsy (Bilateral); and Breast lumpectomy (Left, 09/2018).  Family History Pateint's family history includes Breast cancer in her maternal aunt, paternal aunt, and sister; Lung cancer in her father; Stroke in her mother.  Social History Patient  reports that she quit smoking about 2 years ago. She smoked 1.00 pack per day. She has never used smokeless tobacco. She reports previous alcohol use. She reports that she does not use drugs.    Objective: Vitals:   08/02/20 0851  BP: 100/60  Pulse: 88  Temp: 98.6 F (37 C)  TempSrc: Temporal  SpO2: 97%  Weight: 133 lb 12.8 oz (60.7 kg)  Height: 5' (1.524 m)    Body mass index is 26.13 kg/m.  Physical Exam Vitals reviewed.  Constitutional:      Appearance: Normal appearance. She is normal weight.  HENT:     Head: Normocephalic and atraumatic.  Pulmonary:     Effort: Pulmonary effort is normal.  Chest:     Chest wall: Tenderness present.  Breasts:     Right: Normal. No axillary adenopathy or supraclavicular adenopathy.     Left: Tenderness present. No axillary adenopathy or supraclavicular adenopathy.      Comments: ttp over her lateral left breast in all positions. Also ttp over sternum  Musculoskeletal:        General: Tenderness (ttp over sternum) present.  Lymphadenopathy:     Upper Body:     Right upper body: No supraclavicular or axillary adenopathy.     Left upper body: No supraclavicular or axillary adenopathy.  Neurological:     Mental Status: She is alert.        Assessment/plan: 1. Breast pain, left ? If due to surgery. Will check diagnostic mmg and is due to f/u with her oncologist.  - MM Digital Diagnostic Unilat L; Future  2.  Costochondritis Handout given. Recommended NSAIDs, ice pack, voltaren gel and make sure she is taking her vitamin D supplement.   3. Well woman exam Never followed up on previous referral. discussed can just call, do not need referral but will put one in. Discussed she is mean age for peri menopause and likely starting [place] through this. Discussed this in detail. Declines pregnancy.  - Ambulatory referral to Gynecology      Return if symptoms worsen or fail to improve.     Orma Flaming, MD [place]  08/02/2020

## 2020-08-02 NOTE — Patient Instructions (Addendum)
-diagnostic mammogram ordered for your breast pain on the left.   -pain in middle of chest I believe is costochondritis.  -make sure on daily vitamin D3, recommend 2000IU/day   I put in referral for GYN, Dr. Sumner Boast. You do not need a referral for this so can call and set up if you would like.   Costochondritis  Costochondritis is irritation and swelling (inflammation) of the tissue that connects the ribs to the breastbone (sternum). This tissue is called cartilage. Costochondritis causes pain in the front of the chest. Usually, the pain:  Starts slowly.  Is in more than one rib. What are the causes? The exact cause of this condition is not always known. It results from stress on the tissue in the affected area. The cause of this stress could be:  Chest injury.  Exercise or activity, such as lifting.  Very bad coughing. What increases the risk? You are more likely to develop this condition if you:  Are female.  Are 27-54 years old.  Recently started a new exercise or work activity.  Have low levels of vitamin D.  Have a condition that makes you cough often. What are the signs or symptoms? The main symptom of this condition is chest pain. The pain:  Usually starts slowly and can be sharp or dull.  Gets worse with deep breathing, coughing, or exercise.  Gets better with rest.  May be worse when you press on the affected area of your ribs and breastbone. How is this treated? This condition usually goes away on its own over time. Your doctor may prescribe an NSAID, such as ibuprofen. This can help reduce pain and inflammation. Treatment may also include:  Resting and avoiding activities that make pain worse.  Putting heat or ice on the painful area.  Doing exercises to stretch your chest muscles. If these treatments do not help, your doctor may inject a numbing medicine to help relieve the pain. Follow these instructions at home: Managing pain, stiffness, and  swelling  If told, put ice on the painful area. To do this: ? Put ice in a plastic bag. ? Place a towel between your skin and the bag. ? Leave the ice on for 20 minutes, 2-3 times a day.  If told, put heat on the affected area. Do this as often as told by your doctor. Use the heat source that your doctor recommends, such as a moist heat pack or a heating pad. ? Place a towel between your skin and the heat source. ? Leave the heat on for 20-30 minutes. ? Take off the heat if your skin turns bright red. This is very important if you cannot feel pain, heat, or cold. You may have a greater risk of getting burned.      Activity  Rest as told by your doctor.  Do not do anything that makes your pain worse. This includes any activities that use chest, belly (abdomen), and side muscles.  Do not lift anything that is heavier than 10 lb (4.5 kg), or the limit that you are told, until your doctor says that it is safe.  Return to your normal activities as told by your doctor. Ask your doctor what activities are safe for you. General instructions  Take over-the-counter and prescription medicines only as told by your doctor.  Keep all follow-up visits as told by your doctor. This is important. Contact a doctor if:  You have chills or a fever.  Your pain does not  go away or it gets worse.  You have a cough that does not go away. Get help right away if:  You are short of breath.  You have very bad chest pain that is not helped by medicines, heat, or ice. These symptoms may be an emergency. Do not wait to see if the symptoms will go away. Get medical help right away. Call your local emergency services (911 in the [place] Do not drive yourself to the hospital. Summary  Costochondritis is irritation and swelling (inflammation) of the tissue that connects the ribs to the breastbone (sternum).  This condition causes pain in the front of the chest.  Treatment may include medicines, rest, heat  or ice, and exercises. This information is not intended to replace advice given to you by your health care provider. Make sure you discuss any questions you have with your health care provider. Document Revised: 01/27/2019 Document Reviewed: 01/27/2019 Elsevier Patient Education  2021 [employer].

## 2020-08-15 ENCOUNTER — Other Ambulatory Visit: Payer: Self-pay | Admitting: Family Medicine

## 2020-08-15 DIAGNOSIS — N644 Mastodynia: Secondary | ICD-10-CM

## 2020-10-01 ENCOUNTER — Ambulatory Visit: Payer: 59

## 2020-10-01 ENCOUNTER — Other Ambulatory Visit: Payer: Self-pay

## 2020-10-01 ENCOUNTER — Ambulatory Visit
Admission: RE | Admit: 2020-10-01 | Discharge: 2020-10-01 | Disposition: A | Discharge status: Home / Self Care | Payer: 59 | Source: Ambulatory Visit | Attending: Family Medicine | Admitting: Family Medicine

## 2020-10-01 DIAGNOSIS — N644 Mastodynia: Secondary | ICD-10-CM

## 2020-10-01 IMAGING — MG DIGITAL DIAGNOSTIC BILAT W/ TOMO W/ CAD
6 of 10 series · 6 of 26 positions shown · non-contrast
Comparison: Previous exam(s).

CLINICAL DATA: 50-year-old female status post malignant left
lumpectomy for DCIS in [DATE] with radiation therapy.

EXAM:
DIGITAL DIAGNOSTIC BILATERAL MAMMOGRAM WITH TOMOSYNTHESIS AND CAD
TECHNIQUE: Bilateral digital diagnostic mammography and breast tomosynthesis
was performed. The images were evaluated with computer-aided
detection.

[L CC (1 of 2)]
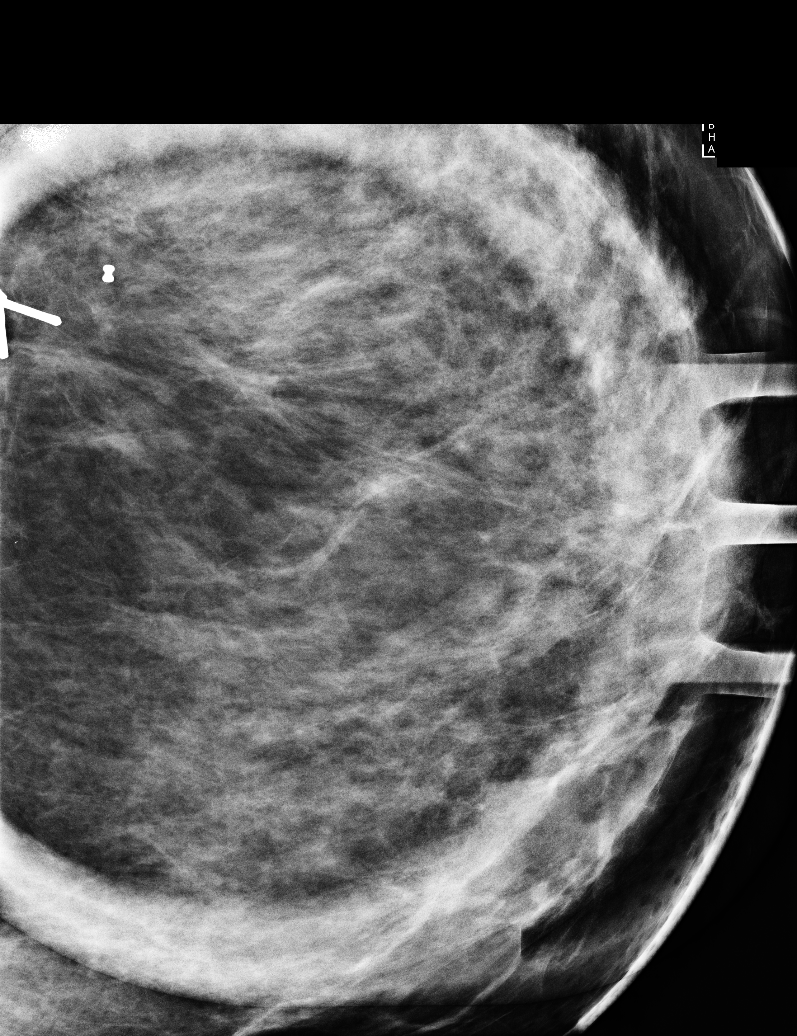

[L CC (2 of 2)]
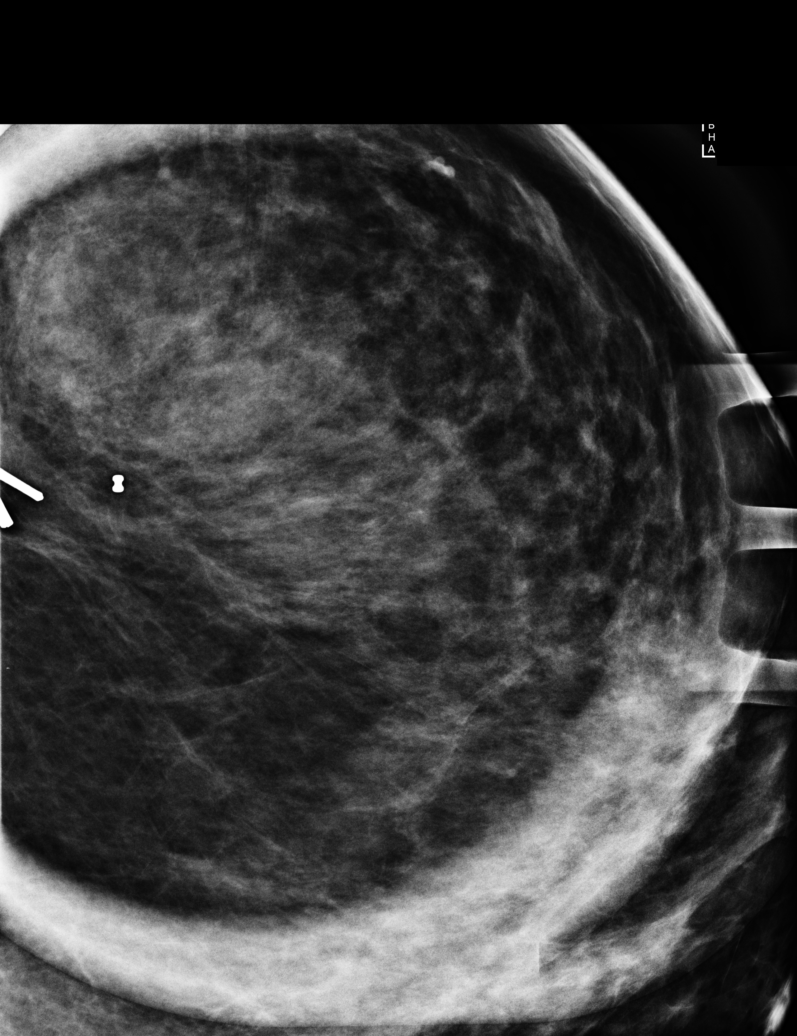

[L CC synth-2D]
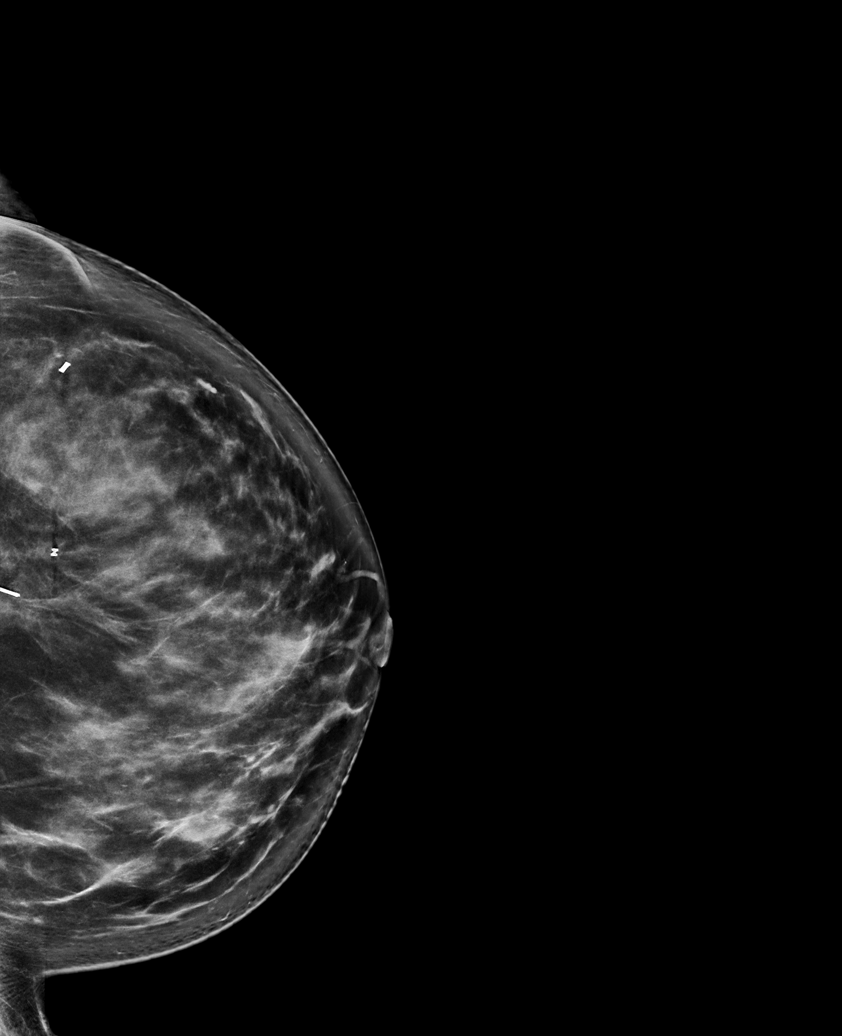

[R CC synth-2D]
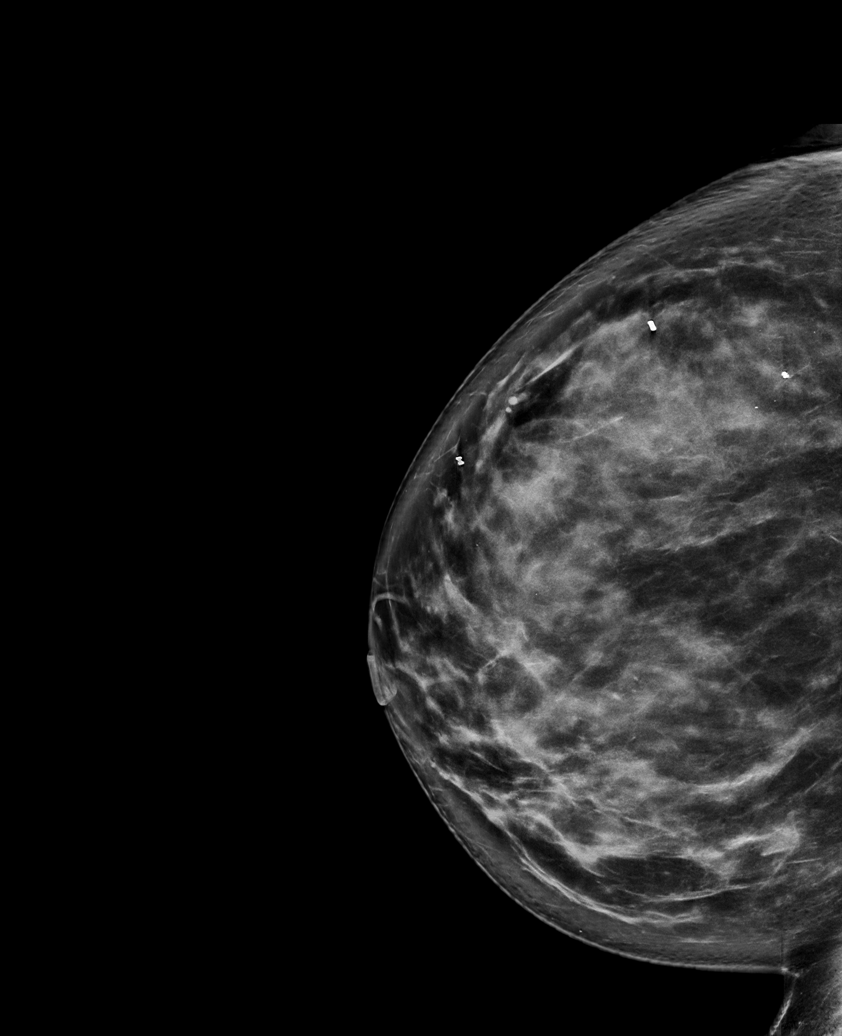

[R MLO synth-2D]
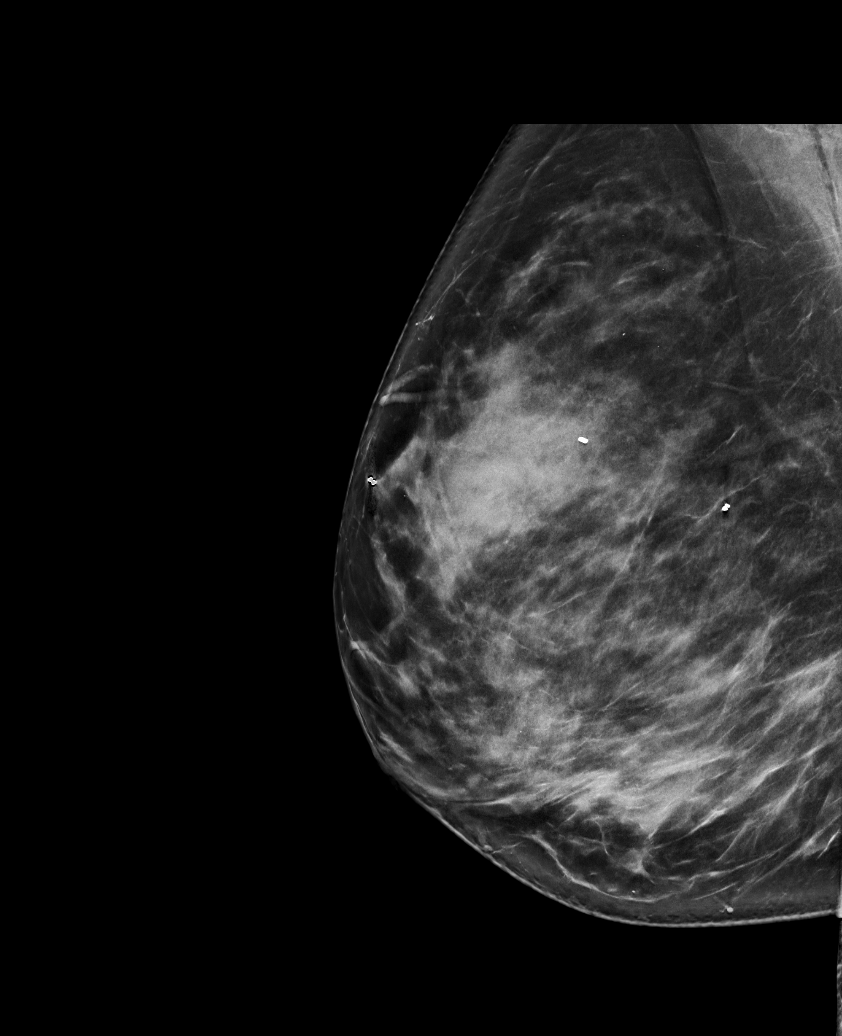

[L MLO synth-2D]
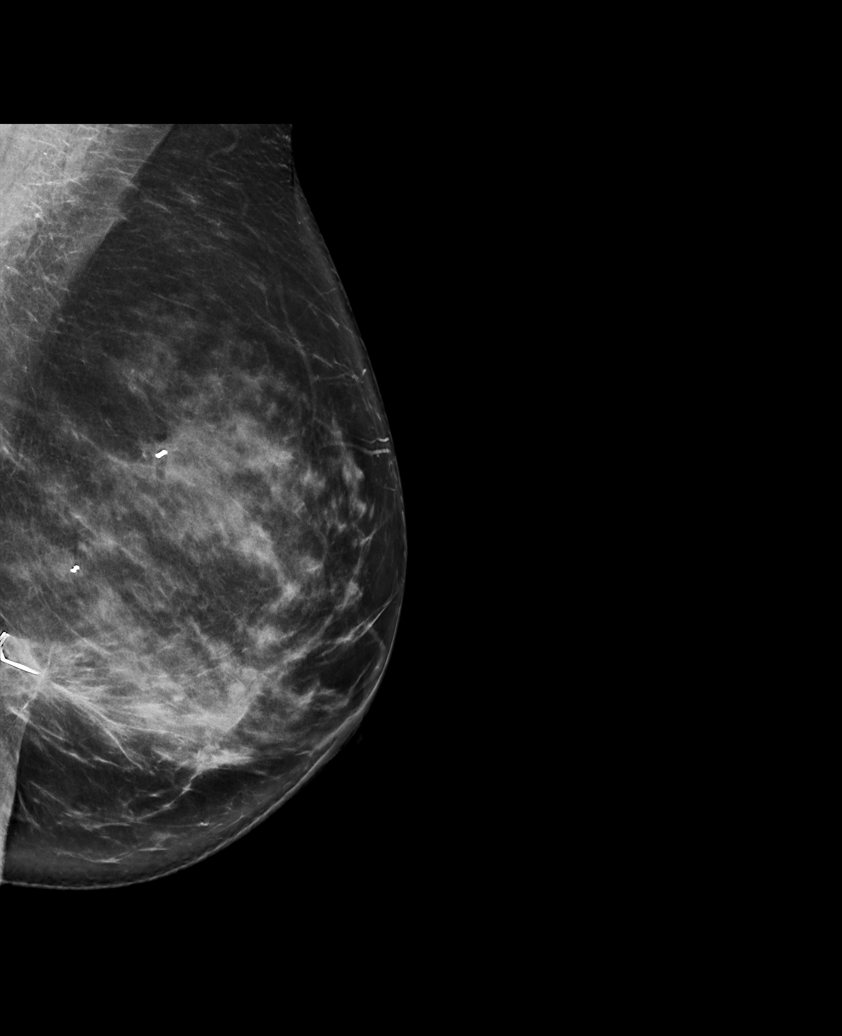

[6 of 26 positions shown; findings below may reference images not displayed]

ACR Breast Density Category c: The breast tissue is heterogeneously
dense, which may obscure small masses.
FINDINGS: Stable post lumpectomy changes noted in the deep central left
breast. No new or suspicious findings in either breast.
IMPRESSION: 1. No mammographic evidence of malignancy in either breast.
2. Stable left breast posttreatment changes.

RECOMMENDATION:
Per protocol, as the patient is now 2 or more years status post
lumpectomy, she may return to annual screening mammography in 1
year. However, given the history of breast cancer, the patient
remains eligible for annual diagnostic mammography if preferred.

I have discussed the findings and recommendations with the patient.
If applicable, a reminder letter will be sent to the patient
regarding the next appointment.

BI-RADS CATEGORY  2: Benign.

## 2020-12-12 ENCOUNTER — Other Ambulatory Visit (HOSPITAL_COMMUNITY)
Admission: RE | Admit: 2020-12-12 | Discharge: 2020-12-12 | Disposition: A | Discharge status: Home / Self Care | Payer: 59 | Source: Ambulatory Visit | Attending: Obstetrics and Gynecology | Admitting: Obstetrics and Gynecology

## 2020-12-12 ENCOUNTER — Other Ambulatory Visit: Payer: Self-pay

## 2020-12-12 ENCOUNTER — Encounter: Payer: Self-pay | Admitting: Obstetrics and Gynecology

## 2020-12-12 ENCOUNTER — Ambulatory Visit: Payer: 59 | Admitting: Obstetrics and Gynecology

## 2020-12-12 VITALS — BP 110/70 | HR 83 | Ht 60.25 in | Wt 134.0 lb

## 2020-12-12 DIAGNOSIS — R7303 Prediabetes: Secondary | ICD-10-CM | POA: Diagnosis not present

## 2020-12-12 DIAGNOSIS — R935 Abnormal findings on diagnostic imaging of other abdominal regions, including retroperitoneum: Secondary | ICD-10-CM

## 2020-12-12 DIAGNOSIS — Z23 Encounter for immunization: Secondary | ICD-10-CM | POA: Diagnosis not present

## 2020-12-12 DIAGNOSIS — E559 Vitamin D deficiency, unspecified: Secondary | ICD-10-CM

## 2020-12-12 DIAGNOSIS — Z01419 Encounter for gynecological examination (general) (routine) without abnormal findings: Secondary | ICD-10-CM

## 2020-12-12 DIAGNOSIS — D0512 Intraductal carcinoma in situ of left breast: Secondary | ICD-10-CM

## 2020-12-12 DIAGNOSIS — Z0000 Encounter for general adult medical examination without abnormal findings: Secondary | ICD-10-CM

## 2020-12-12 DIAGNOSIS — Z124 Encounter for screening for malignant neoplasm of cervix: Secondary | ICD-10-CM | POA: Diagnosis not present

## 2020-12-12 DIAGNOSIS — Z1211 Encounter for screening for malignant neoplasm of colon: Secondary | ICD-10-CM

## 2020-12-12 NOTE — Addendum Note (Signed)
Addended by: Burnice Logan on: 12/12/2020 12:34 PM   Modules accepted: Orders

## 2020-12-12 NOTE — Patient Instructions (Signed)
EXERCISE   We recommended that you start or continue a regular exercise program for good health. Physical activity is anything that gets your body moving, some is better than none. The CDC recommends 150 minutes per week of Moderate-Intensity Aerobic Activity and 2 or more days of Muscle Strengthening Activity.  Benefits of exercise are limitless: helps weight loss/weight maintenance, improves mood and energy, helps with depression and anxiety, improves sleep, tones and strengthens muscles, improves balance, improves bone density, protects from chronic conditions such as heart disease, high blood pressure and diabetes and so much more. To learn more visit: https://www.cdc.gov/physicalactivity/index.html  DIET: Good nutrition starts with a healthy diet of fruits, vegetables, whole grains, and lean protein sources. Drink plenty of water for hydration. Minimize empty calories, sodium, sweets. For more information about dietary recommendations visit: https://health.gov/our-work/nutrition-physical-activity/dietary-guidelines and https://www.myplate.gov/  ALCOHOL:  Women should limit their alcohol intake to no more than 7 drinks/beers/glasses of wine (combined, not each!) per week. Moderation of alcohol intake to this level decreases your risk of breast cancer and liver damage.  If you are concerned that you may have a problem, or your friends have told you they are concerned about your drinking, there are many resources to help. A well-known program that is free, effective, and available to all people all over the nation is Alcoholics Anonymous.  Check out this site to learn more: https://www.aa.org/   CALCIUM AND VITAMIN D:  Adequate intake of calcium and Vitamin D are recommended for bone health.  You should be getting between 1000-1200 mg of calcium and 800 units of Vitamin D daily between diet and supplements  PAP SMEARS:  Pap smears, to check for cervical cancer or precancers,  have traditionally been  done yearly, scientific advances have shown that most women can have pap smears less often.  However, every woman still should have a physical exam from her gynecologist every year. It will include a breast check, inspection of the vulva and vagina to check for abnormal growths or skin changes, a visual exam of the cervix, and then an exam to evaluate the size and shape of the uterus and ovaries. We will also provide age appropriate advice regarding health maintenance, like when you should have certain vaccines, screening for sexually transmitted diseases, bone density testing, colonoscopy, mammograms, etc.   MAMMOGRAMS:  All women over 40 years old should have a routine mammogram.   COLON CANCER SCREENING: Now recommend starting at age 45. At this time colonoscopy is not covered for routine screening until 50. There are take home tests that can be done between 45-49.   COLONOSCOPY:  Colonoscopy to screen for colon cancer is recommended for all women at age 50.  We know, you hate the idea of the prep.  We agree, BUT, having colon cancer and not knowing it is worse!!  Colon cancer so often starts as a polyp that can be seen and removed at colonscopy, which can quite literally save your life!  And if your first colonoscopy is normal and you have no family history of colon cancer, most women don't have to have it again for 10 years.  Once every ten years, you can do something that may end up saving your life, right?  We will be happy to help you get it scheduled when you are ready.  Be sure to check your insurance coverage so you understand how much it will cost.  It may be covered as a preventative service at no cost, but you should check   your particular policy.      Breast Self-Awareness Breast self-awareness means being familiar with how your breasts look and feel. It involves checking your breasts regularly and reporting any changes to your health care provider. Practicing breast self-awareness is  important. A change in your breasts can be a sign of a serious medical problem. Being familiar with how your breasts look and feel allows you to find any problems early, when treatment is more likely to be successful. All women should practice breast self-awareness, including women who have had breast implants. How to do a breast self-exam One way to learn what is normal for your breasts and whether your breasts are changing is to do a breast self-exam. To do a breast self-exam: Look for Changes  Remove all the clothing above your waist. Stand in front of a mirror in a room with good lighting. Put your hands on your hips. Push your hands firmly downward. Compare your breasts in the mirror. Look for differences between them (asymmetry), such as: Differences in shape. Differences in size. Puckers, dips, and bumps in one breast and not the other. Look at each breast for changes in your skin, such as: Redness. Scaly areas. Look for changes in your nipples, such as: Discharge. Bleeding. Dimpling. Redness. A change in position. Feel for Changes Carefully feel your breasts for lumps and changes. It is best to do this while lying on your back on the floor and again while sitting or standing in the shower or tub with soapy water on your skin. Feel each breast in the following way: Place the arm on the side of the breast you are examining above your head. Feel your breast with the other hand. Start in the nipple area and make  inch (2 cm) overlapping circles to feel your breast. Use the pads of your three middle fingers to do this. Apply light pressure, then medium pressure, then firm pressure. The light pressure will allow you to feel the tissue closest to the skin. The medium pressure will allow you to feel the tissue that is a little deeper. The firm pressure will allow you to feel the tissue close to the ribs. Continue the overlapping circles, moving downward over the breast until you feel your  ribs below your breast. Move one finger-width toward the center of the body. Continue to use the  inch (2 cm) overlapping circles to feel your breast as you move slowly up toward your collarbone. Continue the up and down exam using all three pressures until you reach your armpit.  Write Down What You Find  Write down what is normal for each breast and any changes that you find. Keep a written record with breast changes or normal findings for each breast. By writing this information down, you do not need to depend only on memory for size, tenderness, or location. Write down where you are in your menstrual cycle, if you are still menstruating. If you are having trouble noticing differences in your breasts, do not get discouraged. With time you will become more familiar with the variations in your breasts and more comfortable with the exam. How often should I examine my breasts? Examine your breasts every month. If you are breastfeeding, the best time to examine your breasts is after a feeding or after using a breast pump. If you menstruate, the best time to examine your breasts is 5-7 days after your period is over. During your period, your breasts are lumpier, and it may be more   difficult to notice changes. When should I see my health care provider? See your health care provider if you notice: A change in shape or size of your breasts or nipples. A change in the skin of your breast or nipples, such as a reddened or scaly area. Unusual discharge from your nipples. A lump or thick area that was not there before. Pain in your breasts. Anything that concerns you. Chronic Back Pain When back pain lasts longer than 3 months, it is called chronic back pain. The cause of your back pain may not be known. Some common causes include: Wear and tear (degenerative disease) of the bones, ligaments, or disks in your back. Inflammation and stiffness in your back (arthritis). People who have chronic back pain  often go through certain periods in which the pain is more intense (flare-ups). Many people can learn to manage the pain with home care. Follow these instructions at home: Pay attention to any changes in your symptoms. Take these actions to help with your pain: Managing pain and stiffness   If directed, apply ice to the painful area. Your health care provider may recommend applying ice during the first 24-48 hours after a flare-up begins. To do this: Put ice in a plastic bag. Place a towel between your skin and the bag. Leave the ice on for 20 minutes, 2-3 times per day. If directed, apply heat to the affected area as often as told by your health care provider. Use the heat source that your health care provider recommends, such as a moist heat pack or a heating pad. Place a towel between your skin and the heat source. Leave the heat on for 20-30 minutes. Remove the heat if your skin turns bright red. This is especially important if you are unable to feel pain, heat, or cold. You may have a greater risk of getting burned. Try soaking in a warm tub. Activity  Avoid bending and other activities that make the problem worse. Maintain a proper position when standing or sitting: When standing, keep your upper back and neck straight, with your shoulders pulled back. Avoid slouching. When sitting, keep your back straight and relax your shoulders. Do not round your shoulders or pull them backward. Do not sit or stand in one place for long periods of time. Take brief periods of rest throughout the day. This will reduce your pain. Resting in a lying or standing position is usually better than sitting to rest. When you are resting for longer periods, mix in some mild activity or stretching between periods of rest. This will help to prevent stiffness and pain. Get regular exercise. Ask your health care provider what activities are safe for you. Do not lift anything that is heavier than 10 lb (4.5 kg), or  the limit that you are told, until your health care provider says that it is safe. Always use proper lifting technique, which includes: Bending your knees. Keeping the load close to your body. Avoiding twisting. Sleep on a firm mattress in a comfortable position. Try lying on your side with your knees slightly bent. If you lie on your back, put a pillow under your knees. Medicines Treatment may include medicines for pain and inflammation taken by mouth or applied to the skin, prescription pain medicine, or muscle relaxants. Take over-the-counter and prescription medicines only as told by your health care provider. Ask your health care provider if the medicine prescribed to you: Requires you to avoid driving or using machinery. Can cause constipation.  You may need to take these actions to prevent or treat constipation: Drink enough fluid to keep your urine pale yellow. Take over-the-counter or prescription medicines. Eat foods that are high in fiber, such as beans, whole grains, and fresh fruits and vegetables. Limit foods that are high in fat and processed sugars, such as fried or sweet foods. General instructions Do not use any products that contain nicotine or tobacco, such as cigarettes, e-cigarettes, and chewing tobacco. If you need help quitting, ask your health care provider. Keep all follow-up visits as told by your health care provider. This is important. Contact a health care provider if: You have pain that is not relieved with rest or medicine. Your pain gets worse, or you have new pain. You have a high fever. You have rapid weight loss. You have trouble doing your normal activities. Get help right away if: You have weakness or numbness in one or both of your legs or feet. You have trouble controlling your bladder or your bowels. You have severe back pain and have any of the following: Nausea or vomiting. Pain in your abdomen. Shortness of breath or you faint. Summary Chronic  back pain is back pain that lasts longer than 3 months. When a flare-up begins, apply ice to the painful area for the first 24-48 hours. Apply a moist heat pad or use a heating pad on the painful area as directed by your health care provider. When you are resting for longer periods, mix in some mild activity or stretching between periods of rest. This will help to prevent stiffness and pain. This information is not intended to replace advice given to you by your health care provider. Make sure you discuss any questions you have with your health care provider. Document Revised: 04/26/2019 Document Reviewed: 04/26/2019 Elsevier Patient Education  2022 [employer].

## 2020-12-12 NOTE — Progress Notes (Signed)
50 y.o. No obstetric history on file. Married Asian Not Hispanic or Latino female here for annual exam.  In 2016 when patient was in [place] she was told that she had a thick "uterine wall". The ultrasound was part of the annual screen. She was told to have an ultrasound after her cycle, but never did.  Period Cycle (Days): 28 Period Duration (Days): 7 Period Pattern: Regular Menstrual Flow: Moderate Menstrual Control: Thin pad Menstrual Control Change Freq (Hours): 6 Dysmenorrhea: None Dysmenorrhea Symptoms: Headache  H/O left DCIS, s/p lumpectomy in 7/20 followed by radiation rx, negative genetic testing  No significant vasomotor symptoms.  Patient's last menstrual period was 12/06/2020.          Sexually active: No.  The current method of family planning is none.    Exercising: No.  The patient does not participate in regular exercise at present. Smoker:  no  Health Maintenance: Pap:  2018 Normal per patient  History of abnormal Pap:  no MMG:  10/01/20 Density C Bi-rads 2 benign  BMD:   none  Colonoscopy: none TDaP:  01/25/19  Gardasil: n/a   reports that she quit smoking about 2 years ago. Her smoking use included cigarettes. She smoked an average of 1 pack per day. She has never used smokeless tobacco. She reports that she does not currently use alcohol. She reports that she does not use drugs. Son is 64. She works in Therapist, art. Student, studying massage.   Past Medical History:  Diagnosis Date   Breast cancer ([place])    Family history of breast cancer    Gestational diabetes    gestational diabetes   Personal history of radiation therapy     Past Surgical History:  Procedure Laterality Date   BREAST BIOPSY Bilateral    BREAST EXCISIONAL BIOPSY Bilateral    BREAST LUMPECTOMY Left 09/2018   BREAST LUMPECTOMY WITH RADIOACTIVE SEED LOCALIZATION Left 10/20/2018   Procedure: LEFT BREAST LUMPECTOMY WITH BRACKETED RADIOACTIVE SEED LOCALIZATION;  Surgeon: Stark Klein,  MD;  Location: [facility];  Service: General;  Laterality: Left;   CESAREAN SECTION      Current Outpatient Medications  Medication Sig Dispense Refill   Cholecalciferol (VITAMIN D3) 50 MCG (2000 UT) capsule Take 2,000 Units by mouth daily.     No current facility-administered medications for this visit.    Family History  Problem Relation Age of Onset   Stroke Mother    Lung cancer Father        unsure, possibly TB and not lung ca   Breast cancer Maternal Aunt    Breast cancer Sister    Breast cancer Paternal Aunt     Review of Systems  All other systems reviewed and are negative.  Exam:   BP 110/70   Pulse 83   Ht 5' 0.25" (1.53 m)   Wt 134 lb (60.8 kg)   LMP 12/06/2020   SpO2 99%   BMI 25.95 kg/m   Weight change: '@WEIGHTCHANGE'$ @ Height:   Height: 5' 0.25" (153 cm)  Ht Readings from Last 3 Encounters:  12/12/20 5' 0.25" (1.53 m)  08/02/20 5' (1.524 m)  11/03/19 5' (1.524 m)    General appearance: alert, cooperative and appears stated age Head: Normocephalic, without obvious abnormality, atraumatic Neck: no adenopathy, supple, symmetrical, trachea midline and thyroid normal to inspection and palpation Lungs: clear to auscultation bilaterally Cardiovascular: regular rate and rhythm Breasts: normal appearance, no masses or tenderness Abdomen: soft, non-tender; non distended,  no masses,  no  organomegaly Extremities: extremities normal, atraumatic, no cyanosis or edema Skin: Skin color, texture, turgor normal. No rashes or lesions Lymph nodes: Cervical, supraclavicular, and axillary nodes normal. No abnormal inguinal nodes palpated Neurologic: Grossly normal   Pelvic: External genitalia:  no lesions              Urethra:  normal appearing urethra with no masses, tenderness or lesions              Bartholins and Skenes: normal                 Vagina: normal appearing vagina with normal color and discharge, no lesions              Cervix: no lesions                Bimanual Exam:  Uterus:  normal size, contour, position, consistency, mobility, non-tender and retroverted              Adnexa: no mass, fullness, tenderness               Rectovaginal: Confirms               Anus:  normal sphincter tone, no lesions  Gae Dry chaperoned for the exam.  1. Well woman exam Discussed breast self exam Discussed calcium and vit D intake Mammogram UTD  2. Ductal carcinoma in situ (DCIS) of left breast Followed by Dr Jana Hakim  3. Prediabetes - Hemoglobin A1c  4. Vitamin D deficiency - VITAMIN D 25 Hydroxy (Vit-D Deficiency, Fractures)  5. Abnormal ultrasound of uterus Given h/o abnormal u/s and prior recommendation for f/u, I recommended u/s  - US PELVIS TRANSVAGINAL NON-OB (TV ONLY); Future -She isn't sure she will do it, aware of risks of missing pathology  6. Colon cancer screening - Ambulatory referral to Gastroenterology  7. Laboratory exam ordered as part of routine general medical examination - CBC - Comprehensive metabolic panel - Lipid panel  8. Screening for cervical cancer - Cytology - PAP with hpv

## 2020-12-13 LAB — COMPREHENSIVE METABOLIC PANEL
AG Ratio: 1.7 (calc) (ref 1.0–2.5)
ALT: 32 U/L — ABNORMAL HIGH (ref 6–29)
AST: 23 U/L (ref 10–35)
Albumin: 4.7 g/dL (ref 3.6–5.1)
Alkaline phosphatase (APISO): 65 U/L (ref 37–153)
BUN: 9 mg/dL (ref 7–25)
CO2: 29 mmol/L (ref 20–32)
Calcium: 9.9 mg/dL (ref 8.6–10.4)
Chloride: 102 mmol/L (ref 98–110)
Creat: 0.77 mg/dL (ref 0.50–1.03)
Globulin: 2.8 g/dL (calc) (ref 1.9–3.7)
Glucose, Bld: 95 mg/dL (ref 65–99)
Potassium: 4 mmol/L (ref 3.5–5.3)
Sodium: 138 mmol/L (ref 135–146)
Total Bilirubin: 0.4 mg/dL (ref 0.2–1.2)
Total Protein: 7.5 g/dL (ref 6.1–8.1)

## 2020-12-13 LAB — LIPID PANEL
Cholesterol: 199 mg/dL (ref <200)
HDL: 66 mg/dL (ref >=50)
LDL Cholesterol (Calc): 106 mg/dL (calc) — ABNORMAL HIGH
Non-HDL Cholesterol (Calc): 133 mg/dL (calc) — ABNORMAL HIGH (ref <130)
Total CHOL/HDL Ratio: 3 (calc) (ref <5.0)
Triglycerides: 157 mg/dL — ABNORMAL HIGH (ref <150)

## 2020-12-13 LAB — CBC
HCT: 40 % (ref 35.0–45.0)
Hemoglobin: 13.6 g/dL (ref 11.7–15.5)
MCH: 30.4 pg (ref 27.0–33.0)
MCHC: 34 g/dL (ref 32.0–36.0)
MCV: 89.3 fL (ref 80.0–100.0)
MPV: 10.1 fL (ref 7.5–12.5)
Platelets: 355 10*3/uL (ref 140–400)
RBC: 4.48 10*6/uL (ref 3.80–5.10)
RDW: 12.5 % (ref 11.0–15.0)
WBC: 6.9 10*3/uL (ref 3.8–10.8)

## 2020-12-13 LAB — VITAMIN D 25 HYDROXY (VIT D DEFICIENCY, FRACTURES): Vit D, 25-Hydroxy: 45 ng/mL (ref 30–100)

## 2020-12-13 LAB — HEMOGLOBIN A1C
Hgb A1c MFr Bld: 6.4 % of total Hgb — ABNORMAL HIGH (ref <5.7)
Mean Plasma Glucose: 137 mg/dL
eAG (mmol/L): 7.6 mmol/L

## 2020-12-16 LAB — CYTOLOGY - PAP
Comment: NEGATIVE
Diagnosis: NEGATIVE
High risk HPV: NEGATIVE

## 2020-12-18 ENCOUNTER — Telehealth: Payer: Self-pay

## 2020-12-18 NOTE — Telephone Encounter (Signed)
That would be something she would need to get through a primary care provider.

## 2020-12-18 NOTE — Telephone Encounter (Signed)
Patient was informed of all.  She declined nutritonist referral for prediabetes clinic.  She asked if you would prescribe a glucose monitor for her so she can monitor her blood sugars.

## 2020-12-18 NOTE — Telephone Encounter (Signed)
Called patient and per DPR access note on file I left detailed message advising PCP will need to prescribe. I also left her [place] and Phone # as she said her PCP had moved.

## 2020-12-18 NOTE — Telephone Encounter (Signed)
-----   Message from Salvadore Dom, MD sent at 12/18/2020  9:14 AM EDT ----- Please let the patient know that she is still prediabetic and offer her a referral to the prediabetes clinic. Please also let her know that one of her liver function tests is mildly elevated and her lipid panel is mildly abnormal.  She should be eating a diet low in saturated fats, carbohydrates and should exercise regularly. I would recommend repeating her liver panel in 1-2 months, if still elevated she should f/u with her primary. Please place the order for a liver panel and set up a lab appointment. The rest of her lab work is normal. Routine pap f/u

## 2021-02-07 ENCOUNTER — Other Ambulatory Visit: Payer: 59

## 2021-02-11 ENCOUNTER — Ambulatory Visit: Payer: 59 | Admitting: Obstetrics and Gynecology

## 2021-02-12 ENCOUNTER — Ambulatory Visit: Payer: 59 | Admitting: Cardiovascular Disease

## 2021-05-06 ENCOUNTER — Telehealth: Payer: 59 | Admitting: Physician Assistant

## 2021-05-06 DIAGNOSIS — R6889 Other general symptoms and signs: Secondary | ICD-10-CM

## 2021-05-06 MED ORDER — BENZONATATE 100 MG PO CAPS: 100.0000 mg | ORAL_CAPSULE | Freq: Three times a day (TID) | ORAL | 0 refills | Status: DC | PRN

## 2021-05-06 MED ORDER — OSELTAMIVIR PHOSPHATE 75 MG PO CAPS
75.0000 mg | ORAL_CAPSULE | Freq: Two times a day (BID) | ORAL | 0 refills | Status: DC
Start: 2021-05-06 — End: 2021-05-08

## 2021-05-06 NOTE — Patient Instructions (Signed)
°  Shelley Andrews, thank you for joining [name], PA-C for today's virtual visit.  While this provider is not your primary care provider (PCP), if your PCP is located in our provider database this encounter information will be shared with them immediately following your visit.  Consent: (Patient) Shelley Andrews provided verbal consent for this virtual visit at the beginning of the encounter.  Current Medications:  Current Outpatient Medications:    Cholecalciferol (VITAMIN D3) 50 MCG (2000 UT) capsule, Take 2,000 Units by mouth daily., Disp: , Rfl:    Medications ordered in this encounter:  No orders of the defined types were placed in this encounter.    *If you need refills on other medications prior to your next appointment, please contact your pharmacy*  Follow-Up: Call back or seek an in-person evaluation if the symptoms worsen or if the condition fails to improve as anticipated.  Other Instructions Please keep well-hydrated and get plenty of rest. If you have a humidifier, run it in the bedroom at night. Start a saline nasal rinse for any nasal congestion. Ibuprofen if needed for fever, headache and body aches. You can alternate this with Tylenol.  Take the Tamiflu as directed. I have sent in a prescription cough medication (Tessalon) that you can take along with OTC Mucinex-DM.   You need to remain home until on antivirals for 48 hours and fever-free for at least 24 hours.    If you have been instructed to have an in-person evaluation today at a local Urgent Care facility, please use the link below. It will take you to a list of all of our available [facility], including address, phone number and hours of operation. Please do not delay care.  [facility]  If you or a family member do not have a primary care provider, use the link below to schedule a visit and establish care. When you choose a [facility] primary care physician or  advanced practice provider, you gain a long-term partner in health. Find a Primary Care Provider  Learn more about [facility]'s in-office and virtual care options: [facility]

## 2021-05-06 NOTE — Progress Notes (Signed)
Virtual Visit Consent   [name], you are scheduled for a virtual visit with a [facility] provider today.     Just as with appointments in the office, your consent must be obtained to participate.  Your consent will be active for this visit and any virtual visit you may have with one of our providers in the next 365 days.     If you have a MyChart account, a copy of this consent can be sent to you electronically.  All virtual visits are billed to your insurance company just like a traditional visit in the office.    As this is a virtual visit, video technology does not allow for your provider to perform a traditional examination.  This may limit your provider's ability to fully assess your condition.  If your provider identifies any concerns that need to be evaluated in person or the need to arrange testing (such as labs, EKG, etc.), we will make arrangements to do so.     Although advances in technology are sophisticated, we cannot ensure that it will always work on either your end or our end.  If the connection with a video visit is poor, the visit may have to be switched to a telephone visit.  With either a video or telephone visit, we are not always able to ensure that we have a secure connection.     I need to obtain your verbal consent now.   Are you willing to proceed with your visit today?    Shelley Andrews has provided verbal consent on 05/06/2021 for a virtual visit (video or telephone).   Shelley Andrews, [place]   Date: 05/06/2021 7:56 AM   Virtual Visit via Video Note   I, Shelley Andrews, connected with  Shelley Andrews  ([id number], 08-30-1970) on 05/06/21 at  7:45 AM EST by a video-enabled telemedicine application and verified that I am speaking with the correct person using two identifiers.  Location: Patient: Virtual Visit Location Patient: Home Provider: Virtual Visit Location Provider: Home Office   I discussed the limitations of evaluation and  management by telemedicine and the availability of in person appointments. The patient expressed understanding and agreed to proceed.    History of Present Illness: Shelley Andrews is a 51 y.o. who identifies as a female who was assigned female at birth, and is being seen today for sore throat, fever (low temperature), body aches, chills, dry cough and nasal congestion. Throat feels like it is burning. Notes her husband with similar symptoms but he is better now. Has taken Ibuprofen for body aches. Symptoms started suddenly on Sunday night.   HPI: HPI  Problems:  Patient Active Problem List   Diagnosis Date Noted   Prediabetes 01/27/2019   Vitamin D deficiency 01/27/2019   Primary insomnia 01/25/2019   Genetic testing 09/13/2018   Lipoma of breast 09/07/2018   Family history of breast cancer    Ductal carcinoma in situ (DCIS) of left breast 09/05/2018    Allergies: No Known Allergies Medications:  Current Outpatient Medications:    benzonatate (TESSALON) 100 MG capsule, Take 1 capsule (100 mg total) by mouth 3 (three) times daily as needed for cough., Disp: 30 capsule, Rfl: 0   oseltamivir (TAMIFLU) 75 MG capsule, Take 1 capsule (75 mg total) by mouth 2 (two) times daily., Disp: 10 capsule, Rfl: 0  Observations/Objective: Patient is well-developed, well-nourished in no acute distress.  Resting comfortably at home.  Head is normocephalic, atraumatic.  No labored breathing.  Speech is clear and coherent with logical content.  Patient is alert and oriented at baseline.   Assessment and Plan: 1. Flu-like symptoms - oseltamivir (TAMIFLU) 75 MG capsule; Take 1 capsule (75 mg total) by mouth 2 (two) times daily.  Dispense: 10 capsule; Refill: 0 - benzonatate (TESSALON) 100 MG capsule; Take 1 capsule (100 mg total) by mouth 3 (three) times daily as needed for cough.  Dispense: 30 capsule; Refill: 0  Flu-like symptoms within 48 hours of onset. Supportive measures and OTC medications  reviewed. Rx Tamiflu and Tessalon. Work note written. Strict in-person evaluation precautions discussed with patient.   Follow Up Instructions: I discussed the assessment and treatment plan with the patient. The patient was provided an opportunity to ask questions and all were answered. The patient agreed with the plan and demonstrated an understanding of the instructions.  A copy of instructions were sent to the patient via MyChart unless otherwise noted below.   The patient was advised to call back or seek an in-person evaluation if the symptoms worsen or if the condition fails to improve as anticipated.  Time:  I spent 12 minutes with the patient via telehealth technology discussing the above problems/concerns.    Shelley Rio, PA-C

## 2021-05-08 ENCOUNTER — Telehealth: Payer: 59 | Admitting: Physician Assistant

## 2021-05-08 DIAGNOSIS — U071 COVID-19: Secondary | ICD-10-CM

## 2021-05-08 MED ORDER — MOLNUPIRAVIR EUA 200MG CAPSULE: 4.0000 | ORAL_CAPSULE | Freq: Two times a day (BID) | ORAL | 0 refills | Status: AC

## 2021-05-08 NOTE — Progress Notes (Signed)
Virtual Visit Consent   Shelley Andrews, you are scheduled for a virtual visit with a [facility] provider today.     Just as with appointments in the office, your consent must be obtained to participate.  Your consent will be active for this visit and any virtual visit you may have with one of our providers in the next 365 days.     If you have a MyChart account, a copy of this consent can be sent to you electronically.  All virtual visits are billed to your insurance company just like a traditional visit in the office.    As this is a virtual visit, video technology does not allow for your provider to perform a traditional examination.  This may limit your provider's ability to fully assess your condition.  If your provider identifies any concerns that need to be evaluated in person or the need to arrange testing (such as labs, EKG, etc.), we will make arrangements to do so.     Although advances in technology are sophisticated, we cannot ensure that it will always work on either your end or our end.  If the connection with a video visit is poor, the visit may have to be switched to a telephone visit.  With either a video or telephone visit, we are not always able to ensure that we have a secure connection.     I need to obtain your verbal consent now.   Are you willing to proceed with your visit today?    Dasiah Bunte has provided verbal consent on 05/08/2021 for a virtual visit (video or telephone).   Mar Daring, PA-C   Date: 05/08/2021 10:38 AM   Virtual Visit via Video Note   I, Mar Daring, connected with  Shelley Andrews  ([id number], [birth year]) on 05/08/21 at 10:30 AM EST by a video-enabled telemedicine application and verified that I am speaking with the correct person using two identifiers.  Location: Patient: Virtual Visit Location Patient: Home Provider: Virtual Visit Location Provider: Home Office   I discussed the limitations of evaluation and  management by telemedicine and the availability of in person appointments. The patient expressed understanding and agreed to proceed.    History of Present Illness: Shelley Andrews is a 51 y.o. who identifies as a female who was assigned female at birth, and is being seen today for Covid 88.  HPI: URI  This is a new problem. Episode onset: Symptoms started Sunday; thought was flu. Tested positive for Covid 19 this morning. The problem has been gradually worsening. Associated symptoms include congestion, coughing, headaches, rhinorrhea, sinus pain and a sore throat. Associated symptoms comments: Loss of taste.     Problems:  Patient Active Problem List   Diagnosis Date Noted   Prediabetes 01/27/2019   Vitamin D deficiency 01/27/2019   Primary insomnia 01/25/2019   Genetic testing 09/13/2018   Lipoma of breast 09/07/2018   Family history of breast cancer    Ductal carcinoma in situ (DCIS) of left breast 09/05/2018    Allergies: No Known Allergies Medications:  Current Outpatient Medications:    molnupiravir EUA (LAGEVRIO) 200 mg CAPS capsule, Take 4 capsules (800 mg total) by mouth 2 (two) times daily for 5 days., Disp: 40 capsule, Rfl: 0   benzonatate (TESSALON) 100 MG capsule, Take 1 capsule (100 mg total) by mouth 3 (three) times daily as needed for cough., Disp: 30 capsule, Rfl: 0  Observations/Objective: Patient is well-developed, well-nourished in no acute distress.  Resting  comfortably at home.  Head is normocephalic, atraumatic.  No labored breathing.  Speech is clear and coherent with logical content.  Patient is alert and oriented at baseline.    Assessment and Plan: 1. COVID-19 - MyChart COVID-19 home monitoring program; Future - molnupiravir EUA (LAGEVRIO) 200 mg CAPS capsule; Take 4 capsules (800 mg total) by mouth 2 (two) times daily for 5 days.  Dispense: 40 capsule; Refill: 0  - Stop Tamiflu - Continue OTC symptomatic management of choice - Will send OTC  vitamins and supplement information through [facility] - Molnupiravir prescribed - Patient enrolled in MyChart symptom monitoring - Push fluids - Rest as needed - Discussed return precautions and when to seek in-person evaluation, sent via [facility] as well   Follow Up Instructions: I discussed the assessment and treatment plan with the patient. The patient was provided an opportunity to ask questions and all were answered. The patient agreed with the plan and demonstrated an understanding of the instructions.  A copy of instructions were sent to the patient via MyChart unless otherwise noted below.   The patient was advised to call back or seek an in-person evaluation if the symptoms worsen or if the condition fails to improve as anticipated.  Time:  I spent 15 minutes with the patient via telehealth technology discussing the above problems/concerns.    Mar Daring, PA-C

## 2021-05-08 NOTE — Patient Instructions (Signed)
Shelley Andrews, thank you for joining Mar Daring, PA-C for today's virtual visit.  While this provider is not your primary care provider (PCP), if your PCP is located in our provider database this encounter information will be shared with them immediately following your visit.  Consent: (Patient) Shelley Andrews provided verbal consent for this virtual visit at the beginning of the encounter.  Current Medications:  Current Outpatient Medications:    molnupiravir EUA (LAGEVRIO) 200 mg CAPS capsule, Take 4 capsules (800 mg total) by mouth 2 (two) times daily for 5 days., Disp: 40 capsule, Rfl: 0   benzonatate (TESSALON) 100 MG capsule, Take 1 capsule (100 mg total) by mouth 3 (three) times daily as needed for cough., Disp: 30 capsule, Rfl: 0   Medications ordered in this encounter:  Meds ordered this encounter  Medications   molnupiravir EUA (LAGEVRIO) 200 mg CAPS capsule    Sig: Take 4 capsules (800 mg total) by mouth 2 (two) times daily for 5 days.    Dispense:  40 capsule    Refill:  0    Order Specific Question:   Supervising Provider    Answer:   Sabra Heck, [place]     *If you need refills on other medications prior to your next appointment, please contact your pharmacy*  Follow-Up: Call back or seek an in-person evaluation if the symptoms worsen or if the condition fails to improve as anticipated.  Other Instructions COVID-19: Quarantine and Isolation Quarantine If you were exposed Quarantine and stay away from others when you have been in close contact with someone who has COVID-19. Isolate If you are sick or test positive Isolate when you are sick or when you have COVID-19, even if you don't have symptoms. When to stay home Calculating quarantine The date of your exposure is considered day 0. Day 1 is the first full day after your last contact with a person who has had COVID-19. Stay home and away from other people for at least 5 days. Learn why CDC  updated guidance for the general public. IF YOU were exposed to COVID-19 and are NOT  up to dateIF YOU were exposed to COVID-19 and are NOT on COVID-19 vaccinations Quarantine for at least 5 days Stay home Stay home and quarantine for at least 5 full days. Wear a well-fitting mask if you must be around others in your home. Do not travel. Get tested Even if you don't develop symptoms, get tested at least 5 days after you last had close contact with someone with COVID-19. After quarantine Watch for symptoms Watch for symptoms until 10 days after you last had close contact with someone with COVID-19. Avoid travel It is best to avoid travel until a full 10 days after you last had close contact with someone with COVID-19. If you develop symptoms Isolate immediately and get tested. Continue to stay home until you know the results. Wear a well-fitting mask around others. Take precautions until day 10 Wear a well-fitting mask Wear a well-fitting mask for 10 full days any time you are around others inside your home or in public. Do not go to places where you are unable to wear a well-fitting mask. If you must travel during days 6-10, take precautions. Avoid being around people who are more likely to get very sick from COVID-19. IF YOU were exposed to COVID-19 and are  up to dateIF YOU were exposed to COVID-19 and are on COVID-19 vaccinations No quarantine You do not need to stay  home unless you develop symptoms. Get tested Even if you don't develop symptoms, get tested at least 5 days after you last had close contact with someone with COVID-19. Watch for symptoms Watch for symptoms until 10 days after you last had close contact with someone with COVID-19. If you develop symptoms Isolate immediately and get tested. Continue to stay home until you know the results. Wear a well-fitting mask around others. Take precautions until day 10 Wear a well-fitting mask Wear a well-fitting mask for 10  full days any time you are around others inside your home or in public. Do not go to places where you are unable to wear a well-fitting mask. Take precautions if traveling Avoid being around people who are more likely to get very sick from COVID-19. IF YOU were exposed to COVID-19 and had confirmed COVID-19 within the past 90 days (you tested positive using a viral test) No quarantine You do not need to stay home unless you develop symptoms. Watch for symptoms Watch for symptoms until 10 days after you last had close contact with someone with COVID-19. If you develop symptoms Isolate immediately and get tested. Continue to stay home until you know the results. Wear a well-fitting mask around others. Take precautions until day 10 Wear a well-fitting mask Wear a well-fitting mask for 10 full days any time you are around others inside your home or in public. Do not go to places where you are unable to wear a well-fitting mask. Take precautions if traveling Avoid being around people who are more likely to get very sick from COVID-19. Calculating isolation Day 0 is your first day of symptoms or a positive viral test. Day 1 is the first full day after your symptoms developed or your test specimen was collected. If you have COVID-19 or have symptoms, isolate for at least 5 days. IF YOU tested positive for COVID-19 or have symptoms, regardless of vaccination status Stay home for at least 5 days Stay home for 5 days and isolate from others in your home. Wear a well-fitting mask if you must be around others in your home. Do not travel. Ending isolation if you had symptoms End isolation after 5 full days if you are fever-free for 24 hours (without the use of fever-reducing medication) and your symptoms are improving. Ending isolation if you did NOT have symptoms End isolation after at least 5 full days after your positive test. If you got very sick from COVID-19 or have a weakened immune system You  should isolate for at least 10 days. Consult your doctor before ending isolation. Take precautions until day 10 Wear a well-fitting mask Wear a well-fitting mask for 10 full days any time you are around others inside your home or in public. Do not go to places where you are unable to wear a well-fitting mask. Do not travel Do not travel until a full 10 days after your symptoms started or the date your positive test was taken if you had no symptoms. Avoid being around people who are more likely to get very sick from COVID-19. Definitions Exposure Contact with someone infected with SARS-CoV-2, the virus that causes COVID-19, in a way that increases the likelihood of getting infected with the virus. Close contact A close contact is someone who was less than 6 feet away from an infected person (laboratory-confirmed or a clinical diagnosis) for a cumulative total of 15 minutes or more over a 24-hour period. For example, three individual 5-minute exposures for a total  of 15 minutes. People who are exposed to someone with COVID-19 after they completed at least 5 days of isolation are not considered close contacts. Julio Sicks is a strategy used to prevent transmission of COVID-19 by keeping people who have been in close contact with someone with COVID-19 apart from others. Who does not need to quarantine? If you had close contact with someone with COVID-19 and you are in one of the following groups, you do not need to quarantine. You are up to date with your COVID-19 vaccines. You had confirmed COVID-19 within the last 90 days (meaning you tested positive using a viral test). If you are up to date with COVID-19 vaccines, you should wear a well-fitting mask around others for 10 days from the date of your last close contact with someone with COVID-19 (the date of last close contact is considered day 0). Get tested at least 5 days after you last had close contact with someone with COVID-19. If you  test positive or develop COVID-19 symptoms, isolate from other people and follow recommendations in the Isolation section below. If you tested positive for COVID-19 with a viral test within the previous 90 days and subsequently recovered and remain without COVID-19 symptoms, you do not need to quarantine or get tested after close contact. You should wear a well-fitting mask around others for 10 days from the date of your last close contact with someone with COVID-19 (the date of last close contact is considered day 0). If you have COVID-19 symptoms, get tested and isolate from other people and follow recommendations in the Isolation section below. Who should quarantine? If you come into close contact with someone with COVID-19, you should quarantine if you are not up to date on COVID-19 vaccines. This includes people who are not vaccinated. What to do for quarantine Stay home and away from other people for at least 5 days (day 0 through day 5) after your last contact with a person who has COVID-19. The date of your exposure is considered day 0. Wear a well-fitting mask when around others at home, if possible. For 10 days after your last close contact with someone with COVID-19, watch for fever (100.92F or greater), cough, shortness of breath, or other COVID-19 symptoms. If you develop symptoms, get tested immediately and isolate until you receive your test results. If you test positive, follow isolation recommendations. If you do not develop symptoms, get tested at least 5 days after you last had close contact with someone with COVID-19. If you test negative, you can leave your home, but continue to wear a well-fitting mask when around others at home and in public until 10 days after your last close contact with someone with COVID-19. If you test positive, you should isolate for at least 5 days from the date of your positive test (if you do not have symptoms). If you do develop COVID-19 symptoms, isolate  for at least 5 days from the date your symptoms began (the date the symptoms started is day 0). Follow recommendations in the isolation section below. If you are unable to get a test 5 days after last close contact with someone with COVID-19, you can leave your home after day 5 if you have been without COVID-19 symptoms throughout the 5-day period. Wear a well-fitting mask for 10 days after your date of last close contact when around others at home and in public. Avoid people who are have weakened immune systems or are more likely to get very sick  from COVID-19, and nursing homes and other high-risk settings, until after at least 10 days. If possible, stay away from people you live with, especially people who are at higher risk for getting very sick from COVID-19, as well as others outside your home throughout the full 10 days after your last close contact with someone with COVID-19. If you are unable to quarantine, you should wear a well-fitting mask for 10 days when around others at home and in public. If you are unable to wear a mask when around others, you should continue to quarantine for 10 days. Avoid people who have weakened immune systems or are more likely to get very sick from COVID-19, and nursing homes and other high-risk settings, until after at least 10 days. See additional information about travel. Do not go to places where you are unable to wear a mask, such as restaurants and some gyms, and avoid eating around others at home and at work until after 10 days after your last close contact with someone with COVID-19. After quarantine Watch for symptoms until 10 days after your last close contact with someone with COVID-19. If you have symptoms, isolate immediately and get tested. Quarantine in high-risk congregate settings In certain congregate settings that have high risk of secondary transmission (such as Systems analyst and detention facilities, homeless shelters, or cruise ships), CDC  recommends a 10-day quarantine for residents, regardless of vaccination and booster status. During periods of critical staffing shortages, facilities may consider shortening the quarantine period for staff to ensure continuity of operations. Decisions to shorten quarantine in these settings should be made in consultation with state, local, tribal, or territorial health departments and should take into consideration the context and characteristics of the facility. CDC's setting-specific guidance provides additional recommendations for these settings. Isolation Isolation is used to separate people with confirmed or suspected COVID-19 from those without COVID-19. People who are in isolation should stay home until it's safe for them to be around others. At home, anyone sick or infected should separate from others, or wear a well-fitting mask when they need to be around others. People in isolation should stay in a specific "sick room" or area and use a separate bathroom if available. Everyone who has presumed or confirmed COVID-19 should stay home and isolate from other people for at least 5 full days (day 0 is the first day of symptoms or the date of the day of the positive viral test for asymptomatic persons). They should wear a mask when around others at home and in public for an additional 5 days. People who are confirmed to have COVID-19 or are showing symptoms of COVID-19 need to isolate regardless of their vaccination status. This includes: People who have a positive viral test for COVID-19, regardless of whether or not they have symptoms. People with symptoms of COVID-19, including people who are awaiting test results or have not been tested. People with symptoms should isolate even if they do not know if they have been in close contact with someone with COVID-19. What to do for isolation Monitor your symptoms. If you have an emergency warning sign (including trouble breathing), seek emergency medical care  immediately. Stay in a separate room from other household members, if possible. Use a separate bathroom, if possible. Take steps to improve ventilation at home, if possible. Avoid contact with other members of the household and pets. Don't share personal household items, like cups, towels, and utensils. Wear a well-fitting mask when you need to be around other  people. Learn more about what to do if you are sick and how to notify your contacts. Ending isolation for people who had COVID-19 and had symptoms If you had COVID-19 and had symptoms, isolate for at least 5 days. To calculate your 5-day isolation period, day 0 is your first day of symptoms. Day 1 is the first full day after your symptoms developed. You can leave isolation after 5 full days. You can end isolation after 5 full days if you are fever-free for 24 hours without the use of fever-reducing medication and your other symptoms have improved (Loss of taste and smell may persist for weeks or months after recovery and need not delay the end of isolation). You should continue to wear a well-fitting mask around others at home and in public for 5 additional days (day 6 through day 10) after the end of your 5-day isolation period. If you are unable to wear a mask when around others, you should continue to isolate for a full 10 days. Avoid people who have weakened immune systems or are more likely to get very sick from COVID-19, and nursing homes and other high-risk settings, until after at least 10 days. If you continue to have fever or your other symptoms have not improved after 5 days of isolation, you should wait to end your isolation until you are fever-free for 24 hours without the use of fever-reducing medication and your other symptoms have improved. Continue to wear a well-fitting mask through day 10. Contact your healthcare provider if you have questions. See additional information about travel. Do not go to places where you are unable to  wear a mask, such as restaurants and some gyms, and avoid eating around others at home and at work until a full 10 days after your first day of symptoms. If an individual has access to a test and wants to test, the best approach is to use an antigen test1 towards the end of the 5-day isolation period. Collect the test sample only if you are fever-free for 24 hours without the use of fever-reducing medication and your other symptoms have improved (loss of taste and smell may persist for weeks or months after recovery and need not delay the end of isolation). If your test result is positive, you should continue to isolate until day 10. If your test result is negative, you can end isolation, but continue to wear a well-fitting mask around others at home and in public until day 10. Follow additional recommendations for masking and avoiding travel as described above. 1As noted in the labeling for authorized over-the counter antigen tests: Negative results should be treated as presumptive. Negative results do not rule out SARS-CoV-2 infection and should not be used as the sole basis for treatment or patient management decisions, including infection control decisions. To improve results, antigen tests should be used twice over a three-day period with at least 24 hours and no more than 48 hours between tests. Note that these recommendations on ending isolation do not apply to people who are moderately ill or very sick from COVID-19 or have weakened immune systems. See section below for recommendations for when to end isolation for these groups. Ending isolation for people who tested positive for COVID-19 but had no symptoms If you test positive for COVID-19 and never develop symptoms, isolate for at least 5 days. Day 0 is the day of your positive viral test (based on the date you were tested) and day 1 is the first full  day after the specimen was collected for your positive test. You can leave isolation after 5 full  days. If you continue to have no symptoms, you can end isolation after at least 5 days. You should continue to wear a well-fitting mask around others at home and in public until day 10 (day 6 through day 10). If you are unable to wear a mask when around others, you should continue to isolate for 10 days. Avoid people who have weakened immune systems or are more likely to get very sick from COVID-19, and nursing homes and other high-risk settings, until after at least 10 days. If you develop symptoms after testing positive, your 5-day isolation period should start over. Day 0 is your first day of symptoms. Follow the recommendations above for ending isolation for people who had COVID-19 and had symptoms. See additional information about travel. Do not go to places where you are unable to wear a mask, such as restaurants and some gyms, and avoid eating around others at home and at work until 10 days after the day of your positive test. If an individual has access to a test and wants to test, the best approach is to use an antigen test1 towards the end of the 5-day isolation period. If your test result is positive, you should continue to isolate until day 10. If your test result is positive, you can also choose to test daily and if your test result is negative, you can end isolation, but continue to wear a well-fitting mask around others at home and in public until day 10. Follow additional recommendations for masking and avoiding travel as described above. 1As noted in the labeling for authorized over-the counter antigen tests: Negative results should be treated as presumptive. Negative results do not rule out SARS-CoV-2 infection and should not be used as the sole basis for treatment or patient management decisions, including infection control decisions. To improve results, antigen tests should be used twice over a three-day period with at least 24 hours and no more than 48 hours between tests. Ending  isolation for people who were moderately or very sick from COVID-19 or have a weakened immune system People who are moderately ill from COVID-19 (experiencing symptoms that affect the lungs like shortness of breath or difficulty breathing) should isolate for 10 days and follow all other isolation precautions. To calculate your 10-day isolation period, day 0 is your first day of symptoms. Day 1 is the first full day after your symptoms developed. If you are unsure if your symptoms are moderate, talk to a healthcare provider for further guidance. People who are very sick from COVID-19 (this means people who were hospitalized or required intensive care or ventilation support) and people who have weakened immune systems might need to isolate at home longer. They may also require testing with a viral test to determine when they can be around others. CDC recommends an isolation period of at least 10 and up to 20 days for people who were very sick from COVID-19 and for people with weakened immune systems. Consult with your healthcare provider about when you can resume being around other people. If you are unsure if your symptoms are severe or if you have a weakened immune system, talk to a healthcare provider for further guidance. People who have a weakened immune system should talk to their healthcare provider about the potential for reduced immune responses to COVID-19 vaccines and the need to continue to follow current prevention measures (including wearing  a well-fitting mask and avoiding crowds and poorly ventilated indoor spaces) to protect themselves against COVID-19 until advised otherwise by their healthcare provider. Close contacts of immunocompromised people--including household members--should also be encouraged to receive all recommended COVID-19 vaccine doses to help protect these people. Isolation in high-risk congregate settings In certain high-risk congregate settings that have high risk of secondary  transmission and where it is not feasible to cohort people (such as Systems analyst and detention facilities, homeless shelters, and cruise ships), CDC recommends a 10-day isolation period for residents. During periods of critical staffing shortages, facilities may consider shortening the isolation period for staff to ensure continuity of operations. Decisions to shorten isolation in these settings should be made in consultation with state, local, tribal, or territorial health departments and should take into consideration the context and characteristics of the facility. CDC's setting-specific guidance provides additional recommendations for these settings. This CDC guidance is meant to supplement--not replace--any federal, state, local, territorial, or tribal health and safety laws, rules, and regulations. Recommendations for specific settings These recommendations do not apply to healthcare professionals. For guidance specific to these settings, see Healthcare professionals: Interim Guidance for Optician, dispensing with SARS-CoV-2 Infection or Exposure to SARS-CoV-2 Patients, residents, and visitors to healthcare settings: Interim Infection Prevention and Control Recommendations for Healthcare Personnel During the [place] 2019 (COVID-19) Pandemic Additional setting-specific guidance and recommendations are available. These recommendations on quarantine and isolation do apply to [place] settings. Additional guidance is available here: Overview of COVID-19 Quarantine for K-12 Schools Travelers: Travel information and recommendations Congregate facilities and other settings: [employer] for community, work, and school settings Ongoing COVID-19 exposure FAQs I live with someone with COVID-19, but I cannot be separated from them. How do we manage quarantine in this situation? It is very important for people with COVID-19 to remain apart from other people, if possible, even if they  are living together. If separation of the person with COVID-19 from others that they live with is not possible, the other people that they live with will have ongoing exposure, meaning they will be repeatedly exposed until that person is no longer able to spread the virus to other people. In this situation, there are precautions you can take to limit the spread of COVID-19: The person with COVID-19 and everyone they live with should wear a well-fitting mask inside the home. If possible, one person should care for the person with COVID-19 to limit the number of people who are in close contact with the infected person. Take steps to protect yourself and others to reduce transmission in the home: Quarantine if you are not up to date with your COVID-19 vaccines. Isolate if you are sick or tested positive for COVID-19, even if you don't have symptoms. Learn more about the public health recommendations for testing, mask use and quarantine of close contacts, like yourself, who have ongoing exposure. These recommendations differ depending on your vaccination status. What should I do if I have ongoing exposure to COVID-19 from someone I live with? Recommendations for this situation depend on your vaccination status: If you are not up to date on COVID-19 vaccines and have ongoing exposure to COVID-19, you should: Begin quarantine immediately and continue to quarantine throughout the isolation period of the person with COVID-19. Continue to quarantine for an additional 5 days starting the day after the end of isolation for the person with COVID-19. Get tested at least 5 days after the end of isolation of the infected person that lives  with them. If you test negative, you can leave the home but should continue to wear a well-fitting mask when around others at home and in public until 10 days after the end of isolation for the person with COVID-19. Isolate immediately if you develop symptoms of COVID-19 or test  positive. If you are up to date with COVID-19 vaccines and have ongoing exposure to COVID-19, you should: Get tested at least 5 days after your first exposure. A person with COVID-19 is considered infectious starting 2 days before they develop symptoms, or 2 days before the date of their positive test if they do not have symptoms. Get tested again at least 5 days after the end of isolation for the person with COVID-19. Wear a well-fitting mask when you are around the person with COVID-19, and do this throughout their isolation period. Wear a well-fitting mask around others for 10 days after the infected person's isolation period ends. Isolate immediately if you develop symptoms of COVID-19 or test positive. What should I do if multiple people I live with test positive for COVID-19 at different times? Recommendations for this situation depend on your vaccination status: If you are not up to date with your COVID-19 vaccines, you should: Quarantine throughout the isolation period of any infected person that you live with. Continue to quarantine until 5 days after the end of isolation date for the most recently infected person that lives with you. For example, if the last day of isolation of the person most recently infected with COVID-19 was June 30, the new 5-day quarantine period starts on July 1. Get tested at least 5 days after the end of isolation for the most recently infected person that lives with you. Wear a well-fitting mask when you are around any person with COVID-19 while that person is in isolation. Wear a well-fitting mask when you are around other people until 10 days after your last close contact. Isolate immediately if you develop symptoms of COVID-19 or test positive. If you are up to date with your COVID-19 vaccines, you should: Get tested at least 5 days after your first exposure. A person with COVID-19 is considered infectious starting 2 days before they developed symptoms, or 2  days before the date of their positive test if they do not have symptoms. Get tested again at least 5 days after the end of isolation for the most recently infected person that lives with you. Wear a well-fitting mask when you are around any person with COVID-19 while that person is in isolation. Wear a well-fitting mask around others for 10 days after the end of isolation for the most recently infected person that lives with you. For example, if the last day of isolation for the person most recently infected with COVID-19 was June 30, the new 10-day period to wear a well-fitting mask indoors in public starts on July 1. Isolate immediately if you develop symptoms of COVID-19 or test positive. I had COVID-19 and completed isolation. Do I have to quarantine or get tested if someone I live with gets COVID-19 shortly after I completed isolation? No. If you recently completed isolation and someone that lives with you tests positive for the virus that causes COVID-19 shortly after the end of your isolation period, you do not have to quarantine or get tested as long as you do not develop new symptoms. Once all of the people that live together have completed isolation or quarantine, refer to the guidance below for new exposures to COVID-19.  If you had COVID-19 in the previous 90 days and then came into close contact with someone with COVID-19, you do not have to quarantine or get tested if you do not have symptoms. But you should: Wear a well-fitting mask indoors in public for 10 days after your last close contact. Monitor for COVID-19 symptoms for 10 days from the date of your last close contact. Isolate immediately and get tested if symptoms develop. If more than 90 days have passed since your recovery from infection, follow CDC's recommendations for close contacts. These recommendations will differ depending on your vaccination status. 06/26/2020 Content source: [facility]  [facility] ([facility]), Division of Viral Diseases This information is not intended to replace advice given to you by your health care provider. Make sure you discuss any questions you have with your health care provider. Document Revised: 10/30/2020 Document Reviewed: 10/30/2020 Elsevier Patient Education  2022 [employer].    If you have been instructed to have an in-person evaluation today at a local Urgent Care facility, please use the link below. It will take you to a list of all of our available [facility], including address, phone number and hours of operation. Please do not delay care.  [facility]  If you or a family member do not have a primary care provider, use the link below to schedule a visit and establish care. When you choose a [facility] primary care physician or advanced practice provider, you gain a long-term partner in health. Find a Primary Care Provider  Learn more about 's in-office and virtual care options: [facility]

## 2021-08-12 ENCOUNTER — Other Ambulatory Visit: Payer: Self-pay | Admitting: General Surgery

## 2021-08-12 ENCOUNTER — Telehealth: Payer: Self-pay | Admitting: *Deleted

## 2021-08-12 DIAGNOSIS — Z1231 Encounter for screening mammogram for malignant neoplasm of breast: Secondary | ICD-10-CM

## 2021-08-12 NOTE — Telephone Encounter (Signed)
Patient called c/o left breast pain, requesting early mammogram. I advised patient she will need OV, transferred to appointments to schedule. ?

## 2021-08-14 NOTE — Telephone Encounter (Signed)
Patient scheduled on 08/19/21

## 2021-08-15 ENCOUNTER — Telehealth: Payer: Self-pay | Admitting: Adult Health

## 2021-08-15 NOTE — Telephone Encounter (Signed)
Rescheduled appointment. Patient aware.  

## 2021-08-18 ENCOUNTER — Encounter: Payer: Self-pay | Admitting: Adult Health

## 2021-08-18 ENCOUNTER — Telehealth: Payer: Self-pay | Admitting: Adult Health

## 2021-08-18 ENCOUNTER — Other Ambulatory Visit: Payer: Self-pay

## 2021-08-18 ENCOUNTER — Inpatient Hospital Stay: Payer: 59 | Attending: Hematology and Oncology | Admitting: Adult Health

## 2021-08-18 VITALS — BP 113/63 | HR 86 | Temp 97.9°F | Resp 16 | Wt 137.7 lb

## 2021-08-18 DIAGNOSIS — N644 Mastodynia: Secondary | ICD-10-CM | POA: Diagnosis not present

## 2021-08-18 DIAGNOSIS — D0512 Intraductal carcinoma in situ of left breast: Secondary | ICD-10-CM | POA: Diagnosis not present

## 2021-08-18 NOTE — Progress Notes (Signed)
[facility] Follow up:    System, Provider Not In No address on file   DIAGNOSIS:  Cancer Staging  Ductal carcinoma in situ (DCIS) of left breast Staging form: Breast, AJCC 8th Edition - Clinical stage from 09/07/2018: Stage 0 (cTis (DCIS), cN0, cM0, ER-, PR-, HER2: Not Assessed) - Unsigned Nuclear grade: G3 Laterality: Left Staged by: Pathologist and managing physician Stage used in treatment planning: Yes National guidelines used in treatment planning: Yes Type of national guideline used in treatment planning: NCCN - Pathologic stage from 10/20/2018: Stage 0 (pTis (DCIS), pN0, cM0) - Signed by Gardenia Phlegm, NP on 02/04/2020 Stage prefix: Initial diagnosis   SUMMARY OF ONCOLOGIC HISTORY: Oncology History  Ductal carcinoma in situ (DCIS) of left breast  09/05/2018 Initial Diagnosis   Ductal carcinoma in situ (DCIS) of left breast    09/13/2018 Genetic Testing   Genetic testing reported out on 09/13/2018 through the [facility] STAT + Common Hereditary  cancer panel found no pathogenic mutations.    The STAT Breast cancer panel offered by [facility] includes sequencing and rearrangement analysis for the following 9 genes:  ATM, BRCA1, BRCA2, CDH1, CHEK2, PALB2, PTEN, STK11 and TP53.     The Common Hereditary Cancers Panel offered by [facility] includes sequencing and/or deletion duplication testing of the following 47 genes: APC, ATM, AXIN2, BARD1, BMPR1A, BRCA1, BRCA2, BRIP1, CDH1, CDKN2A (p14ARF), CDKN2A (p16INK4a), CKD4, CHEK2, CTNNA1, DICER1, EPCAM (Deletion/duplication testing only), GREM1 (promoter region deletion/duplication testing only), KIT, MEN1, MLH1, MSH2, MSH3, MSH6, MUTYH, NBN, NF1, NHTL1, PALB2, PDGFRA, PMS2, POLD1, POLE, PTEN, RAD50, RAD51C, RAD51D,  SDHB, SDHC, SDHD, SMAD4, SMARCA4. STK11, TP53, TSC1, TSC2, and VHL.  The following genes were evaluated for sequence changes only: SDHA and HOXB13 c.251G>A variant only.   10/20/2018 Cancer Staging    Staging form: Breast, AJCC 8th Edition - Pathologic stage from 10/20/2018: Stage 0 (pTis (DCIS), pN0, cM0)   10/20/2018 Surgery   Left lumpectomy Barry Dienes) [phone]): DCIS, intermediate grade, close but negative margins. ER/PR negative. No regional lymph nodes were examined.   12/06/2018 - 01/02/2019 Radiation Therapy   The patient initially received a dose of 40.05 Gy in 15 fractions to the breast using whole-breast tangent fields. This was delivered using a 3-D conformal technique. The pt received a boost delivering an additional 10 Gy in 5 fractions using a electron boost with 53mV electrons. The total dose was 50.05 Gy.     CURRENT THERAPY: Observation  INTERVAL HISTORY: CDelta Pichon89y.o. female returns for follow-up of her history of noninvasive breast cancer.  Her most recent mammogram was completed on October 01, 2020 and showed no mammographic evidence of malignancy and breast density category C.  She is doing quite well today.  She is not taking any medications and is up-to-date with health maintenance.  She has a concern about left breast focal tenderness that started a couple of weeks ago and is constant in nature.   Patient Active Problem List   Diagnosis Date Noted   Prediabetes 01/27/2019   Vitamin D deficiency 01/27/2019   Primary insomnia 01/25/2019   Genetic testing 09/13/2018   Lipoma of breast 09/07/2018   Family history of breast cancer    Ductal carcinoma in situ (DCIS) of left breast 09/05/2018    has No Known Allergies.  MEDICAL HISTORY: Past Medical History:  Diagnosis Date   Breast cancer ([facility]    Family history of breast cancer    Gestational diabetes    gestational diabetes  Personal history of radiation therapy     SURGICAL HISTORY: Past Surgical History:  Procedure Laterality Date   BREAST BIOPSY Bilateral    BREAST EXCISIONAL BIOPSY Bilateral    BREAST LUMPECTOMY Left 09/2018   BREAST LUMPECTOMY WITH RADIOACTIVE SEED LOCALIZATION Left  10/20/2018   Procedure: LEFT BREAST LUMPECTOMY WITH BRACKETED RADIOACTIVE SEED LOCALIZATION;  Surgeon: Stark Klein, MD;  Location: [facility] OR;  Service: General;  Laterality: Left;   CESAREAN SECTION      SOCIAL HISTORY: Social History   Socioeconomic History   Marital status: Married    Spouse name: Not on file   Number of children: Not on file   Years of education: Not on file   Highest education level: Not on file  Occupational History   Not on file  Tobacco Use   Smoking status: Former    Packs/day: 1.00    Types: Cigarettes    Quit date: 07/08/2018    Years since quitting: 3.1   Smokeless tobacco: Never  Vaping Use   Vaping Use: Never used  Substance and Sexual Activity   Alcohol use: Not Currently   Drug use: Never   Sexual activity: Yes    Birth control/protection: Abstinence  Other Topics Concern   Not on file  Social History Narrative   Not on file   Social Determinants of Health   Financial Resource Strain: Not on file  Food Insecurity: Not on file  Transportation Needs: Not on file  Physical Activity: Not on file  Stress: Not on file  Social Connections: Not on file  Intimate Partner Violence: Not on file    FAMILY HISTORY: Family History  Problem Relation Age of Onset   Stroke Mother    Lung cancer Father        unsure, possibly TB and not lung ca   Breast cancer Maternal Aunt    Breast cancer Sister    Breast cancer Paternal Aunt     Review of Systems  Constitutional:  Negative for appetite change, chills, fatigue, fever and unexpected weight change.  HENT:   Negative for hearing loss, lump/mass and trouble swallowing.   Eyes:  Negative for eye problems and icterus.  Respiratory:  Negative for chest tightness, cough and shortness of breath.   Cardiovascular:  Negative for chest pain, leg swelling and palpitations.  Gastrointestinal:  Negative for abdominal distention, abdominal pain, constipation, diarrhea, nausea and vomiting.  Endocrine:  Negative for hot flashes.  Genitourinary:  Negative for difficulty urinating.   Musculoskeletal:  Negative for arthralgias.  Skin:  Negative for itching and rash.  Neurological:  Negative for dizziness, extremity weakness, headaches and numbness.  Hematological:  Negative for adenopathy. Does not bruise/bleed easily.  Psychiatric/Behavioral:  Negative for depression. The patient is not nervous/anxious.      PHYSICAL EXAMINATION  ECOG PERFORMANCE STATUS: 1 - Symptomatic but completely ambulatory  Vitals:   08/18/21 1059  BP: 113/63  Pulse: 86  Resp: 16  Temp: 97.9 F (36.6 C)  SpO2: 99%    Physical Exam Constitutional:      General: She is not in acute distress.    Appearance: Normal appearance. She is not toxic-appearing.  HENT:     Head: Normocephalic and atraumatic.  Eyes:     General: No scleral icterus. Cardiovascular:     Rate and Rhythm: Normal rate and regular rhythm.     Pulses: Normal pulses.     Heart sounds: Normal heart sounds.  Pulmonary:     Effort: Pulmonary  effort is normal.     Breath sounds: Normal breath sounds.  Chest:     Comments: Right breast is benign left breast is status postlumpectomy and radiation with no sign of local recurrence.  She is positive for focal tenderness at 3:00 about 2 cm from the nipple.  There is no associated skin change nodularity or mass with this.  She does not have any axillary adenopathy. Abdominal:     General: Abdomen is flat. Bowel sounds are normal. There is no distension.     Palpations: Abdomen is soft.     Tenderness: There is no abdominal tenderness.  Musculoskeletal:        General: No swelling.     Cervical back: Neck supple.  Lymphadenopathy:     Cervical: No cervical adenopathy.  Skin:    General: Skin is warm and dry.     Findings: No rash.  Neurological:     General: No focal deficit present.     Mental Status: She is alert.  Psychiatric:        Mood and Affect: Mood normal.        Behavior:  Behavior normal.    LABORATORY DATA:  CBC    Component Value Date/Time   WBC 6.9 12/12/2020 1217   RBC 4.48 12/12/2020 1217   HGB 13.6 12/12/2020 1217   HGB 13.4 09/07/2018 0804   HCT 40.0 12/12/2020 1217   PLT 355 12/12/2020 1217   PLT 321 09/07/2018 0804   MCV 89.3 12/12/2020 1217   MCH 30.4 12/12/2020 1217   MCHC 34.0 12/12/2020 1217   RDW 12.5 12/12/2020 1217   LYMPHSABS 1,443 11/03/2019 0913   MONOABS 0.3 01/25/2019 0849   EOSABS 52 11/03/2019 0913   BASOSABS 33 11/03/2019 0913    CMP     Component Value Date/Time   NA 138 12/12/2020 1217   K 4.0 12/12/2020 1217   CL 102 12/12/2020 1217   CO2 29 12/12/2020 1217   GLUCOSE 95 12/12/2020 1217   BUN 9 12/12/2020 1217   CREATININE 0.77 12/12/2020 1217   CALCIUM 9.9 12/12/2020 1217   PROT 7.5 12/12/2020 1217   ALBUMIN 4.6 01/25/2019 0849   AST 23 12/12/2020 1217   AST 19 09/07/2018 0804   ALT 32 (H) 12/12/2020 1217   ALT 20 09/07/2018 0804   ALKPHOS 56 01/25/2019 0849   BILITOT 0.4 12/12/2020 1217   BILITOT 0.6 09/07/2018 0804   GFRNONAA >60 09/07/2018 0804   GFRAA >60 09/07/2018 0804       ASSESSMENT and THERAPY PLAN:   Ductal carcinoma in situ (DCIS) of left breast Chonni is here today for follow-up of her history of breast cancer and new left breast focal tenderness.  She has no clinical or radiographic sign of breast cancer recurrence.  Considering the fact that this focal area of tenderness has been present for 2 weeks and is not improving I have ordered diagnostic mammogram and ultrasound of the area.  It is reassuring that there are no areas of clinical abnormalities.  We discussed healthy diet and exercise and I reviewed this with her in detail.  She will return in 1 year for continued follow-up and surveillance.    All questions were answered. The patient knows to call the clinic with any problems, questions or concerns. We can certainly see the patient much sooner if necessary.  Total encounter  time:20 minutes*in face-to-face visit time, chart review, lab review, care coordination, order entry, and documentation of the encounter time.  Wilber Bihari, NP 08/18/21 11:30 AM Medical Oncology and Hematology [facility] [place], [place] [address] Tel. [phone]    Fax. [phone]  *Total Encounter Time as defined by the Centers for Medicare and Medicaid Services includes, in addition to the face-to-face time of a patient visit (documented in the note above) non-face-to-face time: obtaining and reviewing outside history, ordering and reviewing medications, tests or procedures, care coordination (communications with other health care professionals or caregivers) and documentation in the medical record.

## 2021-08-18 NOTE — Assessment & Plan Note (Signed)
Shelley Andrews is here today for follow-up of her history of breast cancer and new left breast focal tenderness.  She has no clinical or radiographic sign of breast cancer recurrence.  Considering the fact that this focal area of tenderness has been present for 2 weeks and is not improving I have ordered diagnostic mammogram and ultrasound of the area.  It is reassuring that there are no areas of clinical abnormalities.  We discussed healthy diet and exercise and I reviewed this with her in detail.  She will return in 1 year for continued follow-up and surveillance.

## 2021-08-18 NOTE — Telephone Encounter (Signed)
Scheduled appointment per 5/22 los. Left message.

## 2021-08-19 ENCOUNTER — Ambulatory Visit: Payer: 59 | Admitting: Radiology

## 2021-08-20 ENCOUNTER — Ambulatory Visit: Payer: 59 | Admitting: Hematology and Oncology

## 2021-10-02 ENCOUNTER — Ambulatory Visit
Admission: RE | Admit: 2021-10-02 | Discharge: 2021-10-02 | Disposition: A | Discharge status: Home / Self Care | Payer: 59 | Source: Ambulatory Visit | Attending: Adult Health | Admitting: Adult Health

## 2021-10-02 DIAGNOSIS — D0512 Intraductal carcinoma in situ of left breast: Secondary | ICD-10-CM

## 2021-10-15 ENCOUNTER — Encounter: Payer: Self-pay | Admitting: Gastroenterology

## 2021-10-28 ENCOUNTER — Ambulatory Visit (AMBULATORY_SURGERY_CENTER): Payer: Self-pay | Admitting: *Deleted

## 2021-10-28 VITALS — Ht 60.25 in | Wt 137.0 lb

## 2021-10-28 DIAGNOSIS — Z1211 Encounter for screening for malignant neoplasm of colon: Secondary | ICD-10-CM

## 2021-10-28 MED ORDER — NA SULFATE-K SULFATE-MG SULF 17.5-3.13-1.6 GM/177ML PO SOLN: 1.0000 | ORAL | 0 refills | Status: DC

## 2021-10-28 NOTE — Progress Notes (Signed)
Patient & spouse is here in-person for PV. Patient denies any allergies to eggs or soy. Patient denies any problems with anesthesia/sedation. Patient is not on any oxygen at home. Patient is not taking any diet/weight loss medications or blood thinners. Went over procedure prep instructions with the patient. Patient is aware of our care-partner policy. Patient notified to use Good[facility]Rx for prescription. Emmi sent to mychart.

## 2021-11-20 ENCOUNTER — Encounter: Payer: Self-pay | Admitting: Gastroenterology

## 2021-12-02 ENCOUNTER — Ambulatory Visit (AMBULATORY_SURGERY_CENTER): Payer: BC Managed Care – PPO | Admitting: Gastroenterology

## 2021-12-02 ENCOUNTER — Encounter: Payer: Self-pay | Admitting: Gastroenterology

## 2021-12-02 VITALS — BP 117/56 | HR 62 | Temp 98.0°F | Resp 11 | Ht 60.25 in | Wt 137.0 lb

## 2021-12-02 DIAGNOSIS — Z1211 Encounter for screening for malignant neoplasm of colon: Secondary | ICD-10-CM

## 2021-12-02 DIAGNOSIS — D124 Benign neoplasm of descending colon: Secondary | ICD-10-CM | POA: Diagnosis not present

## 2021-12-02 MED ORDER — SODIUM CHLORIDE 0.9 % IV SOLN: 500.0000 mL | Freq: Once | INTRAVENOUS | Status: DC

## 2021-12-02 NOTE — Progress Notes (Signed)
Called to room to assist during endoscopic procedure.  Patient ID and intended procedure confirmed with present staff. Received instructions for my participation in the procedure from the performing physician.  

## 2021-12-02 NOTE — Progress Notes (Signed)
To pacu, VSS. Report to [email or web address] 

## 2021-12-02 NOTE — Op Note (Signed)
Shelley Andrews Patient Name: Shelley Andrews Procedure Date: 12/02/2021 9:49 AM MRN: [id number] Endoscopist: Thornton Park MD, MD Age: 51 Referring MD:  Date of Birth: [birth year] Gender: Female Account #: [id number] Procedure:                Colonoscopy Indications:              Screening for colorectal malignant neoplasm, This                            is the patient's first colonoscopy Medicines:                Monitored Anesthesia Care Procedure:                Pre-Anesthesia Assessment:                           - Prior to the procedure, a History and Physical                            was performed, and patient medications and                            allergies were reviewed. The patient's tolerance of                            previous anesthesia was also reviewed. The risks                            and benefits of the procedure and the sedation                            options and risks were discussed with the patient.                            All questions were answered, and informed consent                            was obtained. Prior Anticoagulants: The patient has                            taken no previous anticoagulant or antiplatelet                            agents. ASA Grade Assessment: II - A patient with                            mild systemic disease. After reviewing the risks                            and benefits, the patient was deemed in                            satisfactory condition to undergo the procedure.  After obtaining informed consent, the colonoscope                            was passed under direct vision. Throughout the                            procedure, the patient's blood pressure, pulse, and                            oxygen saturations were monitored continuously. The                            Olympus CF-[id number]) Colonoscope was                            introduced through the  anus and advanced to the 1                            cm into the ileum. A second forward view of the                            right colon was performed. The colonoscopy was                            performed without difficulty. The patient tolerated                            the procedure well. The quality of the bowel                            preparation was good. The terminal ileum, ileocecal                            valve, appendiceal orifice, and rectum were                            photographed. Scope In: 9:52:50 AM Scope Out: 10:04:15 AM Scope Withdrawal Time: 0 hours 8 minutes 47 seconds  Total Procedure Duration: 0 hours 11 minutes 25 seconds  Findings:                 The perianal and digital rectal examinations were                            normal.                           A 4 mm polyp was found in the proximal descending                            colon. The polyp was sessile. The polyp was removed                            with a cold snare. Resection and retrieval were  complete. Estimated blood loss was minimal.                           The exam was otherwise without abnormality on                            direct and retroflexion views. Complications:            No immediate complications. Estimated Blood Loss:     Estimated blood loss was minimal. Impression:               - One 4 mm polyp in the proximal descending colon,                            removed with a cold snare. Resected and retrieved.                           - The examination was otherwise normal on direct                            and retroflexion views. Recommendation:           - Patient has a contact number available for                            emergencies. The signs and symptoms of potential                            delayed complications were discussed with the                            patient. Return to normal activities tomorrow.                             Written discharge instructions were provided to the                            patient.                           - Resume previous diet.                           - Continue present medications.                           - Await pathology results.                           - Repeat colonoscopy date to be determined after                            pending pathology results are reviewed for                            surveillance.                           -  Emerging evidence supports eating a diet of                            fruits, vegetables, grains, calcium, and yogurt                            while reducing red meat and alcohol may reduce the                            risk of colon cancer.                           - Thank you for allowing me to be involved in your                            colon cancer prevention. Thornton Park MD, MD 12/02/2021 10:09:40 AM This report has been signed electronically.

## 2021-12-02 NOTE — Progress Notes (Signed)
   Referring Provider: Thornton Park, MD Primary Care Physician:  Sueanne Margarita, DO  Indication for Colonoscopy:  Colon cancer screening   IMPRESSION:  Need for colon cancer screening Appropriate candidate for monitored anesthesia care  PLAN: Colonoscopy in the [facility] today   HPI: Shelley Andrews is a 51 y.o. female presents for screening colonoscopy.  No prior colonoscopy or colon cancer screening.  No known family history of colon cancer or polyps. No family history of uterine/endometrial cancer, pancreatic cancer or gastric/stomach cancer.   Past Medical History:  Diagnosis Date   Breast cancer ([facility]) 2020   left   Family history of breast cancer    Gestational diabetes    gestational diabetes   Personal history of radiation therapy     Past Surgical History:  Procedure Laterality Date   BREAST BIOPSY Bilateral    BREAST EXCISIONAL BIOPSY Bilateral    BREAST LUMPECTOMY Left 09/2018   BREAST LUMPECTOMY WITH RADIOACTIVE SEED LOCALIZATION Left 10/20/2018   Procedure: LEFT BREAST LUMPECTOMY WITH BRACKETED RADIOACTIVE SEED LOCALIZATION;  Surgeon: Stark Klein, MD;  Location: [facility];  Service: General;  Laterality: Left;   CESAREAN SECTION      Current Outpatient Medications  Medication Sig Dispense Refill   Multiple Vitamins-Minerals (WOMENS MULTIVITAMIN PO) Take by mouth.     VITAMIN D PO Take by mouth.     meloxicam (MOBIC) 15 MG tablet Take 15 mg by mouth daily as needed.     Current Facility-Administered Medications  Medication Dose Route Frequency Provider Last Rate Last Admin   0.9 %  sodium chloride infusion  500 mL Intravenous Once Thornton Park, MD        Allergies as of 12/02/2021   (No Known Allergies)    Family History  Problem Relation Age of Onset   Stroke Mother    Lung cancer Father        unsure, possibly TB and not lung ca   Breast cancer Sister    Breast cancer Maternal Aunt    Breast cancer Paternal Aunt    Colon cancer Neg Hx     Esophageal cancer Neg Hx    Stomach cancer Neg Hx      Physical Exam: General:   Alert,  well-nourished, pleasant and cooperative in NAD Head:  Normocephalic and atraumatic. Eyes:  Sclera clear, no icterus.   Conjunctiva pink. Mouth:  No deformity or lesions.   Neck:  Supple; no masses or thyromegaly. Lungs:  Clear throughout to auscultation.   No wheezes. Heart:  Regular rate and rhythm; no murmurs. Abdomen:  Soft, non-tender, nondistended, normal bowel sounds, no rebound or guarding.  Msk:  Symmetrical. No boney deformities LAD: No inguinal or umbilical LAD Extremities:  No clubbing or edema. Neurologic:  Alert and  oriented x4;  grossly nonfocal Skin:  No obvious rash or bruise. Psych:  Alert and cooperative. Normal mood and affect.     Studies/Results: No results found.    Makaylynn Bonillas L. Tarri Glenn, MD, MPH 12/02/2021, 9:43 AM

## 2021-12-02 NOTE — Patient Instructions (Signed)
Handout on polyps given to you today   YOU HAD AN ENDOSCOPIC PROCEDURE TODAY AT [facility]:   Refer to the procedure report that was given to you for any specific questions about what was found during the examination.  If the procedure report does not answer your questions, please call your gastroenterologist to clarify.  If you requested that your care partner not be given the details of your procedure findings, then the procedure report has been included in a sealed envelope for you to review at your convenience later.  YOU SHOULD EXPECT: Some feelings of bloating in the abdomen. Passage of more gas than usual.  Walking can help get rid of the air that was put into your GI tract during the procedure and reduce the bloating. If you had a lower endoscopy (such as a colonoscopy or flexible sigmoidoscopy) you may notice spotting of blood in your stool or on the toilet paper. If you underwent a bowel prep for your procedure, you may not have a normal bowel movement for a few days.  Please Note:  You might notice some irritation and congestion in your nose or some drainage.  This is from the oxygen used during your procedure.  There is no need for concern and it should clear up in a day or so.  SYMPTOMS TO REPORT IMMEDIATELY:  Following lower endoscopy (colonoscopy or flexible sigmoidoscopy):  Excessive amounts of blood in the stool  Significant tenderness or worsening of abdominal pains  Swelling of the abdomen that is new, acute  Fever of 100F or higher  For urgent or emergent issues, a gastroenterologist can be reached at any hour by calling [phone]. Do not use MyChart messaging for urgent concerns.    DIET:  We do recommend a small meal at first, but then you may proceed to your regular diet.  Drink plenty of fluids but you should avoid alcoholic beverages for 24 hours.  ACTIVITY:  You should plan to take it easy for the rest of today and you should NOT DRIVE or use  heavy machinery until tomorrow (because of the sedation medicines used during the test).    FOLLOW UP: Our staff will call the number listed on your records the next business day following your procedure.  We will call around 7:15- 8:00 am to check on you and address any questions or concerns that you may have regarding the information given to you following your procedure. If we do not reach you, we will leave a message.  If you develop any symptoms (ie: fever, flu-like symptoms, shortness of breath, cough etc.) before then, please call [phone].  If you test positive for Covid 19 in the 2 weeks post procedure, please call and report this information to [place].    If any biopsies were taken you will be contacted by phone or by letter within the next 1-3 weeks.  Please call us at [phone] if you have not heard about the biopsies in 3 weeks.    SIGNATURES/CONFIDENTIALITY: You and/or your care partner have signed paperwork which will be entered into your electronic medical record.  These signatures attest to the fact that that the information above on your After Visit Summary has been reviewed and is understood.  Full responsibility of the confidentiality of this discharge information lies with you and/or your care-partner.

## 2021-12-03 ENCOUNTER — Telehealth: Payer: Self-pay

## 2021-12-03 NOTE — Telephone Encounter (Signed)
  Follow up Call-     12/02/2021    9:16 AM  Call back number  Post procedure Call Back phone  # [phone]  Permission to leave phone message Yes     Patient questions:  Do you have a fever, pain , or abdominal swelling? No. Pain Score  0 *  Have you tolerated food without any problems? Yes.    Have you been able to return to your normal activities? Yes.    Do you have any questions about your discharge instructions: Diet   No. Medications  No. Follow up visit  No.  Do you have questions or concerns about your Care? No.  Actions: * If pain score is 4 or above: No action needed, pain <4.

## 2021-12-14 ENCOUNTER — Encounter: Payer: Self-pay | Admitting: Gastroenterology

## 2022-01-28 DIAGNOSIS — M9902 Segmental and somatic dysfunction of thoracic region: Secondary | ICD-10-CM | POA: Diagnosis not present

## 2022-01-28 DIAGNOSIS — M5451 Vertebrogenic low back pain: Secondary | ICD-10-CM | POA: Diagnosis not present

## 2022-01-28 DIAGNOSIS — M9901 Segmental and somatic dysfunction of cervical region: Secondary | ICD-10-CM | POA: Diagnosis not present

## 2022-01-28 DIAGNOSIS — M9903 Segmental and somatic dysfunction of lumbar region: Secondary | ICD-10-CM | POA: Diagnosis not present

## 2022-02-02 DIAGNOSIS — M9901 Segmental and somatic dysfunction of cervical region: Secondary | ICD-10-CM | POA: Diagnosis not present

## 2022-02-02 DIAGNOSIS — M5451 Vertebrogenic low back pain: Secondary | ICD-10-CM | POA: Diagnosis not present

## 2022-02-02 DIAGNOSIS — M9903 Segmental and somatic dysfunction of lumbar region: Secondary | ICD-10-CM | POA: Diagnosis not present

## 2022-02-02 DIAGNOSIS — M9902 Segmental and somatic dysfunction of thoracic region: Secondary | ICD-10-CM | POA: Diagnosis not present

## 2022-02-04 DIAGNOSIS — M5451 Vertebrogenic low back pain: Secondary | ICD-10-CM | POA: Diagnosis not present

## 2022-02-04 DIAGNOSIS — M9901 Segmental and somatic dysfunction of cervical region: Secondary | ICD-10-CM | POA: Diagnosis not present

## 2022-02-04 DIAGNOSIS — M9902 Segmental and somatic dysfunction of thoracic region: Secondary | ICD-10-CM | POA: Diagnosis not present

## 2022-02-04 DIAGNOSIS — M9903 Segmental and somatic dysfunction of lumbar region: Secondary | ICD-10-CM | POA: Diagnosis not present

## 2022-02-09 DIAGNOSIS — M5451 Vertebrogenic low back pain: Secondary | ICD-10-CM | POA: Diagnosis not present

## 2022-02-09 DIAGNOSIS — M9901 Segmental and somatic dysfunction of cervical region: Secondary | ICD-10-CM | POA: Diagnosis not present

## 2022-02-09 DIAGNOSIS — M9903 Segmental and somatic dysfunction of lumbar region: Secondary | ICD-10-CM | POA: Diagnosis not present

## 2022-02-09 DIAGNOSIS — M9902 Segmental and somatic dysfunction of thoracic region: Secondary | ICD-10-CM | POA: Diagnosis not present

## 2022-02-11 DIAGNOSIS — M9901 Segmental and somatic dysfunction of cervical region: Secondary | ICD-10-CM | POA: Diagnosis not present

## 2022-02-11 DIAGNOSIS — M5451 Vertebrogenic low back pain: Secondary | ICD-10-CM | POA: Diagnosis not present

## 2022-02-11 DIAGNOSIS — M9903 Segmental and somatic dysfunction of lumbar region: Secondary | ICD-10-CM | POA: Diagnosis not present

## 2022-02-11 DIAGNOSIS — M9902 Segmental and somatic dysfunction of thoracic region: Secondary | ICD-10-CM | POA: Diagnosis not present

## 2022-02-16 DIAGNOSIS — M9903 Segmental and somatic dysfunction of lumbar region: Secondary | ICD-10-CM | POA: Diagnosis not present

## 2022-02-16 DIAGNOSIS — M9902 Segmental and somatic dysfunction of thoracic region: Secondary | ICD-10-CM | POA: Diagnosis not present

## 2022-02-16 DIAGNOSIS — M5451 Vertebrogenic low back pain: Secondary | ICD-10-CM | POA: Diagnosis not present

## 2022-02-16 DIAGNOSIS — M9901 Segmental and somatic dysfunction of cervical region: Secondary | ICD-10-CM | POA: Diagnosis not present

## 2022-02-18 DIAGNOSIS — M9902 Segmental and somatic dysfunction of thoracic region: Secondary | ICD-10-CM | POA: Diagnosis not present

## 2022-02-18 DIAGNOSIS — M5451 Vertebrogenic low back pain: Secondary | ICD-10-CM | POA: Diagnosis not present

## 2022-02-18 DIAGNOSIS — M9903 Segmental and somatic dysfunction of lumbar region: Secondary | ICD-10-CM | POA: Diagnosis not present

## 2022-02-18 DIAGNOSIS — M9901 Segmental and somatic dysfunction of cervical region: Secondary | ICD-10-CM | POA: Diagnosis not present

## 2022-02-23 DIAGNOSIS — M9901 Segmental and somatic dysfunction of cervical region: Secondary | ICD-10-CM | POA: Diagnosis not present

## 2022-02-23 DIAGNOSIS — M5451 Vertebrogenic low back pain: Secondary | ICD-10-CM | POA: Diagnosis not present

## 2022-02-23 DIAGNOSIS — M9902 Segmental and somatic dysfunction of thoracic region: Secondary | ICD-10-CM | POA: Diagnosis not present

## 2022-02-23 DIAGNOSIS — M9903 Segmental and somatic dysfunction of lumbar region: Secondary | ICD-10-CM | POA: Diagnosis not present

## 2022-02-25 DIAGNOSIS — M9901 Segmental and somatic dysfunction of cervical region: Secondary | ICD-10-CM | POA: Diagnosis not present

## 2022-02-25 DIAGNOSIS — M5451 Vertebrogenic low back pain: Secondary | ICD-10-CM | POA: Diagnosis not present

## 2022-02-25 DIAGNOSIS — M9902 Segmental and somatic dysfunction of thoracic region: Secondary | ICD-10-CM | POA: Diagnosis not present

## 2022-02-25 DIAGNOSIS — M9903 Segmental and somatic dysfunction of lumbar region: Secondary | ICD-10-CM | POA: Diagnosis not present

## 2022-03-02 DIAGNOSIS — M9902 Segmental and somatic dysfunction of thoracic region: Secondary | ICD-10-CM | POA: Diagnosis not present

## 2022-03-02 DIAGNOSIS — M5451 Vertebrogenic low back pain: Secondary | ICD-10-CM | POA: Diagnosis not present

## 2022-03-02 DIAGNOSIS — M9903 Segmental and somatic dysfunction of lumbar region: Secondary | ICD-10-CM | POA: Diagnosis not present

## 2022-03-02 DIAGNOSIS — M9901 Segmental and somatic dysfunction of cervical region: Secondary | ICD-10-CM | POA: Diagnosis not present

## 2022-03-04 DIAGNOSIS — M9902 Segmental and somatic dysfunction of thoracic region: Secondary | ICD-10-CM | POA: Diagnosis not present

## 2022-03-04 DIAGNOSIS — M9903 Segmental and somatic dysfunction of lumbar region: Secondary | ICD-10-CM | POA: Diagnosis not present

## 2022-03-04 DIAGNOSIS — M9901 Segmental and somatic dysfunction of cervical region: Secondary | ICD-10-CM | POA: Diagnosis not present

## 2022-03-04 DIAGNOSIS — M5451 Vertebrogenic low back pain: Secondary | ICD-10-CM | POA: Diagnosis not present

## 2022-03-11 DIAGNOSIS — M5451 Vertebrogenic low back pain: Secondary | ICD-10-CM | POA: Diagnosis not present

## 2022-03-11 DIAGNOSIS — M9901 Segmental and somatic dysfunction of cervical region: Secondary | ICD-10-CM | POA: Diagnosis not present

## 2022-03-11 DIAGNOSIS — M9903 Segmental and somatic dysfunction of lumbar region: Secondary | ICD-10-CM | POA: Diagnosis not present

## 2022-03-11 DIAGNOSIS — M9902 Segmental and somatic dysfunction of thoracic region: Secondary | ICD-10-CM | POA: Diagnosis not present

## 2022-03-16 DIAGNOSIS — M9901 Segmental and somatic dysfunction of cervical region: Secondary | ICD-10-CM | POA: Diagnosis not present

## 2022-03-16 DIAGNOSIS — H179 Unspecified corneal scar and opacity: Secondary | ICD-10-CM | POA: Diagnosis not present

## 2022-03-16 DIAGNOSIS — M9902 Segmental and somatic dysfunction of thoracic region: Secondary | ICD-10-CM | POA: Diagnosis not present

## 2022-03-16 DIAGNOSIS — M5451 Vertebrogenic low back pain: Secondary | ICD-10-CM | POA: Diagnosis not present

## 2022-03-16 DIAGNOSIS — H52223 Regular astigmatism, bilateral: Secondary | ICD-10-CM | POA: Diagnosis not present

## 2022-03-16 DIAGNOSIS — M9903 Segmental and somatic dysfunction of lumbar region: Secondary | ICD-10-CM | POA: Diagnosis not present

## 2022-03-17 DIAGNOSIS — M9903 Segmental and somatic dysfunction of lumbar region: Secondary | ICD-10-CM | POA: Diagnosis not present

## 2022-03-17 DIAGNOSIS — M5451 Vertebrogenic low back pain: Secondary | ICD-10-CM | POA: Diagnosis not present

## 2022-03-17 DIAGNOSIS — M9902 Segmental and somatic dysfunction of thoracic region: Secondary | ICD-10-CM | POA: Diagnosis not present

## 2022-03-17 DIAGNOSIS — M9901 Segmental and somatic dysfunction of cervical region: Secondary | ICD-10-CM | POA: Diagnosis not present

## 2022-03-18 DIAGNOSIS — M5451 Vertebrogenic low back pain: Secondary | ICD-10-CM | POA: Diagnosis not present

## 2022-03-18 DIAGNOSIS — M9901 Segmental and somatic dysfunction of cervical region: Secondary | ICD-10-CM | POA: Diagnosis not present

## 2022-03-18 DIAGNOSIS — M9903 Segmental and somatic dysfunction of lumbar region: Secondary | ICD-10-CM | POA: Diagnosis not present

## 2022-03-18 DIAGNOSIS — M9902 Segmental and somatic dysfunction of thoracic region: Secondary | ICD-10-CM | POA: Diagnosis not present

## 2022-04-01 DIAGNOSIS — M9903 Segmental and somatic dysfunction of lumbar region: Secondary | ICD-10-CM | POA: Diagnosis not present

## 2022-04-01 DIAGNOSIS — M9901 Segmental and somatic dysfunction of cervical region: Secondary | ICD-10-CM | POA: Diagnosis not present

## 2022-04-01 DIAGNOSIS — M9902 Segmental and somatic dysfunction of thoracic region: Secondary | ICD-10-CM | POA: Diagnosis not present

## 2022-04-01 DIAGNOSIS — M5451 Vertebrogenic low back pain: Secondary | ICD-10-CM | POA: Diagnosis not present

## 2022-04-02 DIAGNOSIS — M9902 Segmental and somatic dysfunction of thoracic region: Secondary | ICD-10-CM | POA: Diagnosis not present

## 2022-04-02 DIAGNOSIS — M5451 Vertebrogenic low back pain: Secondary | ICD-10-CM | POA: Diagnosis not present

## 2022-04-02 DIAGNOSIS — M9901 Segmental and somatic dysfunction of cervical region: Secondary | ICD-10-CM | POA: Diagnosis not present

## 2022-04-02 DIAGNOSIS — M9903 Segmental and somatic dysfunction of lumbar region: Secondary | ICD-10-CM | POA: Diagnosis not present

## 2022-04-06 DIAGNOSIS — M9903 Segmental and somatic dysfunction of lumbar region: Secondary | ICD-10-CM | POA: Diagnosis not present

## 2022-04-06 DIAGNOSIS — M9901 Segmental and somatic dysfunction of cervical region: Secondary | ICD-10-CM | POA: Diagnosis not present

## 2022-04-06 DIAGNOSIS — M9902 Segmental and somatic dysfunction of thoracic region: Secondary | ICD-10-CM | POA: Diagnosis not present

## 2022-04-06 DIAGNOSIS — M5451 Vertebrogenic low back pain: Secondary | ICD-10-CM | POA: Diagnosis not present

## 2022-04-08 DIAGNOSIS — M9902 Segmental and somatic dysfunction of thoracic region: Secondary | ICD-10-CM | POA: Diagnosis not present

## 2022-04-08 DIAGNOSIS — M9903 Segmental and somatic dysfunction of lumbar region: Secondary | ICD-10-CM | POA: Diagnosis not present

## 2022-04-08 DIAGNOSIS — M9901 Segmental and somatic dysfunction of cervical region: Secondary | ICD-10-CM | POA: Diagnosis not present

## 2022-04-08 DIAGNOSIS — M5451 Vertebrogenic low back pain: Secondary | ICD-10-CM | POA: Diagnosis not present

## 2022-04-13 DIAGNOSIS — M9901 Segmental and somatic dysfunction of cervical region: Secondary | ICD-10-CM | POA: Diagnosis not present

## 2022-04-13 DIAGNOSIS — M9902 Segmental and somatic dysfunction of thoracic region: Secondary | ICD-10-CM | POA: Diagnosis not present

## 2022-04-13 DIAGNOSIS — M5451 Vertebrogenic low back pain: Secondary | ICD-10-CM | POA: Diagnosis not present

## 2022-04-13 DIAGNOSIS — M9903 Segmental and somatic dysfunction of lumbar region: Secondary | ICD-10-CM | POA: Diagnosis not present

## 2022-04-20 DIAGNOSIS — M9902 Segmental and somatic dysfunction of thoracic region: Secondary | ICD-10-CM | POA: Diagnosis not present

## 2022-04-20 DIAGNOSIS — M9901 Segmental and somatic dysfunction of cervical region: Secondary | ICD-10-CM | POA: Diagnosis not present

## 2022-04-20 DIAGNOSIS — M5451 Vertebrogenic low back pain: Secondary | ICD-10-CM | POA: Diagnosis not present

## 2022-04-20 DIAGNOSIS — M9903 Segmental and somatic dysfunction of lumbar region: Secondary | ICD-10-CM | POA: Diagnosis not present

## 2022-04-22 DIAGNOSIS — M9903 Segmental and somatic dysfunction of lumbar region: Secondary | ICD-10-CM | POA: Diagnosis not present

## 2022-04-22 DIAGNOSIS — M9902 Segmental and somatic dysfunction of thoracic region: Secondary | ICD-10-CM | POA: Diagnosis not present

## 2022-04-22 DIAGNOSIS — M9901 Segmental and somatic dysfunction of cervical region: Secondary | ICD-10-CM | POA: Diagnosis not present

## 2022-04-22 DIAGNOSIS — M5451 Vertebrogenic low back pain: Secondary | ICD-10-CM | POA: Diagnosis not present

## 2022-04-23 DIAGNOSIS — R002 Palpitations: Secondary | ICD-10-CM | POA: Diagnosis not present

## 2022-04-23 DIAGNOSIS — R7303 Prediabetes: Secondary | ICD-10-CM | POA: Diagnosis not present

## 2022-04-23 DIAGNOSIS — R7301 Impaired fasting glucose: Secondary | ICD-10-CM | POA: Diagnosis not present

## 2022-04-29 DIAGNOSIS — E559 Vitamin D deficiency, unspecified: Secondary | ICD-10-CM | POA: Diagnosis not present

## 2022-04-29 DIAGNOSIS — M545 Low back pain, unspecified: Secondary | ICD-10-CM | POA: Diagnosis not present

## 2022-04-29 DIAGNOSIS — Z0000 Encounter for general adult medical examination without abnormal findings: Secondary | ICD-10-CM | POA: Diagnosis not present

## 2022-04-29 DIAGNOSIS — Z1339 Encounter for screening examination for other mental health and behavioral disorders: Secondary | ICD-10-CM | POA: Diagnosis not present

## 2022-04-29 DIAGNOSIS — R7989 Other specified abnormal findings of blood chemistry: Secondary | ICD-10-CM | POA: Diagnosis not present

## 2022-04-29 DIAGNOSIS — Z1331 Encounter for screening for depression: Secondary | ICD-10-CM | POA: Diagnosis not present

## 2022-04-29 DIAGNOSIS — R82998 Other abnormal findings in urine: Secondary | ICD-10-CM | POA: Diagnosis not present

## 2022-04-29 DIAGNOSIS — R7303 Prediabetes: Secondary | ICD-10-CM | POA: Diagnosis not present

## 2022-05-04 DIAGNOSIS — M545 Low back pain, unspecified: Secondary | ICD-10-CM | POA: Diagnosis not present

## 2022-05-12 DIAGNOSIS — M545 Low back pain, unspecified: Secondary | ICD-10-CM | POA: Diagnosis not present

## 2022-05-14 DIAGNOSIS — M545 Low back pain, unspecified: Secondary | ICD-10-CM | POA: Diagnosis not present

## 2022-05-21 DIAGNOSIS — M545 Low back pain, unspecified: Secondary | ICD-10-CM | POA: Diagnosis not present

## 2022-05-26 DIAGNOSIS — M545 Low back pain, unspecified: Secondary | ICD-10-CM | POA: Diagnosis not present

## 2022-05-28 DIAGNOSIS — M545 Low back pain, unspecified: Secondary | ICD-10-CM | POA: Diagnosis not present

## 2022-08-06 ENCOUNTER — Telehealth: Payer: Self-pay | Admitting: Adult Health

## 2022-08-06 NOTE — Telephone Encounter (Signed)
Rescheduled appointment per room resource. Left voicemail.  

## 2022-08-20 ENCOUNTER — Encounter: Payer: 59 | Admitting: Adult Health

## 2022-08-21 ENCOUNTER — Inpatient Hospital Stay: Payer: BC Managed Care – PPO | Attending: Adult Health | Admitting: Adult Health

## 2022-08-21 ENCOUNTER — Encounter: Payer: Self-pay | Admitting: Adult Health

## 2022-08-21 VITALS — BP 116/69 | HR 80 | Temp 97.2°F | Resp 17 | Wt 130.0 lb

## 2022-08-21 DIAGNOSIS — D0512 Intraductal carcinoma in situ of left breast: Secondary | ICD-10-CM | POA: Diagnosis not present

## 2022-08-21 DIAGNOSIS — N6332 Unspecified lump in axillary tail of the left breast: Secondary | ICD-10-CM | POA: Diagnosis not present

## 2022-08-21 DIAGNOSIS — Z923 Personal history of irradiation: Secondary | ICD-10-CM | POA: Diagnosis not present

## 2022-08-21 DIAGNOSIS — N644 Mastodynia: Secondary | ICD-10-CM | POA: Insufficient documentation

## 2022-08-21 DIAGNOSIS — Z801 Family history of malignant neoplasm of trachea, bronchus and lung: Secondary | ICD-10-CM | POA: Diagnosis not present

## 2022-08-21 DIAGNOSIS — Z803 Family history of malignant neoplasm of breast: Secondary | ICD-10-CM | POA: Diagnosis not present

## 2022-08-21 DIAGNOSIS — Z87891 Personal history of nicotine dependence: Secondary | ICD-10-CM | POA: Insufficient documentation

## 2022-08-21 NOTE — Progress Notes (Signed)
Cancer Center Cancer Follow up:    Shelley Ferretti, DO [address] [place] [address]   DIAGNOSIS:  Cancer Staging  Ductal carcinoma in situ (DCIS) of left breast Staging form: Breast, AJCC 8th Edition - Clinical stage from 09/07/2018: Stage 0 (cTis (DCIS), cN0, cM0, ER-, PR-, HER2: Not Assessed) - Unsigned Nuclear grade: G3 Laterality: Left Staged by: Pathologist and managing physician Stage used in treatment planning: Yes National guidelines used in treatment planning: Yes Type of national guideline used in treatment planning: NCCN - Pathologic stage from 10/20/2018: Stage 0 (pTis (DCIS), pN0, cM0) - Signed by Loa Socks, NP on 02/04/2020 Stage prefix: Initial diagnosis   SUMMARY OF ONCOLOGIC HISTORY: Oncology History  Ductal carcinoma in situ (DCIS) of left breast  09/05/2018 Initial Diagnosis   Ductal carcinoma in situ (DCIS) of left breast   09/13/2018 Genetic Testing   Genetic testing reported out on 09/13/2018 through the [facility] STAT + Common Hereditary  cancer panel found no pathogenic mutations.    The STAT Breast cancer panel offered by [facility] includes sequencing and rearrangement analysis for the following 9 genes:  ATM, BRCA1, BRCA2, CDH1, CHEK2, PALB2, PTEN, STK11 and TP53.     The Common Hereditary Cancers Panel offered by [facility] includes sequencing and/or deletion duplication testing of the following 47 genes: APC, ATM, AXIN2, BARD1, BMPR1A, BRCA1, BRCA2, BRIP1, CDH1, CDKN2A (p14ARF), CDKN2A (p16INK4a), CKD4, CHEK2, CTNNA1, DICER1, EPCAM (Deletion/duplication testing only), GREM1 (promoter region deletion/duplication testing only), KIT, MEN1, MLH1, MSH2, MSH3, MSH6, MUTYH, NBN, NF1, NHTL1, PALB2, PDGFRA, PMS2, POLD1, POLE, PTEN, RAD50, RAD51C, RAD51D,  SDHB, SDHC, SDHD, SMAD4, SMARCA4. STK11, TP53, TSC1, TSC2, and VHL.  The following genes were evaluated for sequence changes only: SDHA and HOXB13 c.251G>A variant only.   10/20/2018 Cancer  Staging   Staging form: Breast, AJCC 8th Edition - Pathologic stage from 10/20/2018: Stage 0 (pTis (DCIS), pN0, cM0)   10/20/2018 Surgery   Left lumpectomy Donell Beers) [phone]): DCIS, intermediate grade, close but negative margins. ER/PR negative. No regional lymph nodes were examined.   12/06/2018 - 01/02/2019 Radiation Therapy   The patient initially received a dose of 40.05 Gy in 15 fractions to the breast using whole-breast tangent fields. This was delivered using a 3-D conformal technique. The pt received a boost delivering an additional 10 Gy in 5 fractions using a electron boost with electrons. The total dose was 50.05 Gy.     CURRENT THERAPY: observation  INTERVAL HISTORY: Shelley Andrews 52 y.o. female returns for f/u of her non invasive breast cancer.  She is doing well today.  She notes that she sees her PCP regularly.  She c/o left breast pain in her upper outer breast x 4 months.  She notices it more as a focal tenderness that is intermittent with no specific pattern.  She also notes some left axillary swelling.  She says this swelling waxes and wanes since undergoing surgery.   Her most recent mammogram occurred on October 02, 2021 demonstrating no mammographic evidence of malignancy and breast density category C.   Patient Active Problem List   Diagnosis Date Noted   Prediabetes 01/27/2019   Vitamin D deficiency 01/27/2019   Primary insomnia 01/25/2019   Genetic testing 09/13/2018   Lipoma of breast 09/07/2018   Family history of breast cancer    Ductal carcinoma in situ (DCIS) of left breast 09/05/2018    has No Known Allergies.  MEDICAL HISTORY: Past Medical History:  Diagnosis Date   Breast cancer (  [facility]) 2020   left   Family history of breast cancer    Gestational diabetes    gestational diabetes   Personal history of radiation therapy     SURGICAL HISTORY: Past Surgical History:  Procedure Laterality Date   BREAST BIOPSY Bilateral    BREAST  EXCISIONAL BIOPSY Bilateral    BREAST LUMPECTOMY Left 09/2018   BREAST LUMPECTOMY WITH RADIOACTIVE SEED LOCALIZATION Left 10/20/2018   Procedure: LEFT BREAST LUMPECTOMY WITH BRACKETED RADIOACTIVE SEED LOCALIZATION;  Surgeon: Almond Lint, MD;  Location: [facility];  Service: General;  Laterality: Left;   CESAREAN SECTION      SOCIAL HISTORY: Social History   Socioeconomic History   Marital status: Married    Spouse name: Not on file   Number of children: Not on file   Years of education: Not on file   Highest education level: Not on file  Occupational History   Not on file  Tobacco Use   Smoking status: Former    Packs/day: 1    Types: Cigarettes    Quit date: 07/08/2018    Years since quitting: 4.1   Smokeless tobacco: Never  Vaping Use   Vaping Use: Never used  Substance and Sexual Activity   Alcohol use: Yes    Comment: occ   Drug use: Never   Sexual activity: Yes    Birth control/protection: Abstinence  Other Topics Concern   Not on file  Social History Narrative   Not on file   Social Determinants of Health   Financial Resource Strain: Not on file  Food Insecurity: Not on file  Transportation Needs: No Transportation Needs (11/08/2018)   PRAPARE - Transportation    Lack of Transportation (Medical): No    Lack of Transportation (Non-Medical): No  Physical Activity: Not on file  Stress: Not on file  Social Connections: Not on file  Intimate Partner Violence: Not on file    FAMILY HISTORY: Family History  Problem Relation Age of Onset   Stroke Mother    Lung cancer Father        unsure, possibly TB and not lung ca   Breast cancer Sister    Breast cancer Maternal Aunt    Breast cancer Paternal Aunt    Colon cancer Neg Hx    Esophageal cancer Neg Hx    Stomach cancer Neg Hx     Review of Systems  Constitutional:  Negative for appetite change, chills, fatigue, fever and unexpected weight change.  HENT:   Negative for hearing loss, lump/mass and trouble  swallowing.   Eyes:  Negative for eye problems and icterus.  Respiratory:  Negative for chest tightness, cough and shortness of breath.   Cardiovascular:  Negative for chest pain, leg swelling and palpitations.  Gastrointestinal:  Negative for abdominal distention, abdominal pain, constipation, diarrhea, nausea and vomiting.  Endocrine: Negative for hot flashes.  Genitourinary:  Negative for difficulty urinating.   Musculoskeletal:  Negative for arthralgias.  Skin:  Negative for itching and rash.  Neurological:  Negative for dizziness, extremity weakness, headaches and numbness.  Hematological:  Negative for adenopathy. Does not bruise/bleed easily.  Psychiatric/Behavioral:  Negative for depression. The patient is not nervous/anxious.       PHYSICAL EXAMINATION    Vitals:   08/21/22 1055  BP: 116/69  Pulse: 80  Resp: 17  Temp: (!) 97.2 F (36.2 C)  SpO2: 98%    Physical Exam Constitutional:      General: She is not in acute distress.  Appearance: Normal appearance. She is not toxic-appearing.  HENT:     Head: Normocephalic and atraumatic.     Mouth/Throat:     Mouth: Mucous membranes are moist.     Pharynx: Oropharynx is clear. No oropharyngeal exudate or posterior oropharyngeal erythema.  Eyes:     General: No scleral icterus. Cardiovascular:     Rate and Rhythm: Normal rate and regular rhythm.     Pulses: Normal pulses.     Heart sounds: Normal heart sounds.  Pulmonary:     Effort: Pulmonary effort is normal.     Breath sounds: Normal breath sounds.  Chest:     Comments: Right breast benign, left breast s/p lumpectomy and radiation, axillary swelling is present without distinct lymphadenopathy, there is RUOQ left breast tenderness to palpation, no nodules, masses, or signs of breast cancer recurrence noted. Abdominal:     General: Abdomen is flat. Bowel sounds are normal. There is no distension.     Palpations: Abdomen is soft.     Tenderness: There is no  abdominal tenderness.  Musculoskeletal:        General: No swelling.     Cervical back: Neck supple.  Lymphadenopathy:     Cervical: No cervical adenopathy.  Skin:    General: Skin is warm and dry.     Findings: No rash.  Neurological:     General: No focal deficit present.     Mental Status: She is alert.  Psychiatric:        Mood and Affect: Mood normal.        Behavior: Behavior normal.     LABORATORY DATA: None for this visit    ASSESSMENT and THERAPY PLAN:   Ductal carcinoma in situ (DCIS) of left breast Shelley Andrews is a 52 year old woman with ER/PR negative DCIS of the left breast diagnosed in 2020 s/p lumpectomy and adjuvant radaition therapy.  DCIS: She will continue to undergo annual mammography and breast exams as observation.  Her screening 3D mammogram is due next in July 2024. Breast pain and axillary swelling: We will obtain a diagnostic mammogram and ultrasound of this area.  I also placed a referral to physical therapy so long as the mammogram and ultrasound is negative. Health maintenance: Her gynecologist has retired so I gave her the name of Dr. Leda Quail.  I recommended continued healthy diet and exercise. Shelley Andrews will return to clinic in 1 year for continued long-term surveillance.  She knows to call for any questions or concerns that may arise between now and her next visit.   All questions were answered. The patient knows to call the clinic with any problems, questions or concerns. We can certainly see the patient much sooner if necessary.  Total encounter time:30 minutes*in face-to-face visit time, chart review, lab review, care coordination, order entry, and documentation of the encounter time.    Lillard Anes, NP 08/21/22 11:57 AM [facility] [address] [place], [place] [address] Tel. [phone]    Fax. [phone]  *Total Encounter Time as defined by the Centers for Medicare and Medicaid  Services includes, in addition to the face-to-face time of a patient visit (documented in the note above) non-face-to-face time: obtaining and reviewing outside history, ordering and reviewing medications, tests or procedures, care coordination (communications with other health care professionals or caregivers) and documentation in the medical record.

## 2022-08-21 NOTE — Assessment & Plan Note (Signed)
Shelley Andrews is a 52 year old woman with ER/PR negative DCIS of the left breast diagnosed in 2020 s/p lumpectomy and adjuvant radaition therapy.  DCIS: She will continue to undergo annual mammography and breast exams as observation.  Her screening 3D mammogram is due next in July 2024. Breast pain and axillary swelling: We will obtain a diagnostic mammogram and ultrasound of this area.  I also placed a referral to physical therapy so long as the mammogram and ultrasound is negative. Health maintenance: Her gynecologist has retired so I gave her the name of Dr. Leda Quail.  I recommended continued healthy diet and exercise. Shelley Andrews will return to clinic in 1 year for continued long-term surveillance.  She knows to call for any questions or concerns that may arise between now and her next visit.

## 2022-08-25 ENCOUNTER — Telehealth: Payer: Self-pay | Admitting: Adult Health

## 2022-08-25 NOTE — Telephone Encounter (Signed)
Scheduled appointment per 5/24 los. Patient is aware of the made appointment.  

## 2022-09-09 ENCOUNTER — Ambulatory Visit
Admission: RE | Admit: 2022-09-09 | Discharge: 2022-09-09 | Disposition: A | Discharge status: Home / Self Care | Payer: BC Managed Care – PPO | Source: Ambulatory Visit | Attending: Adult Health | Admitting: Adult Health

## 2022-09-09 DIAGNOSIS — D0512 Intraductal carcinoma in situ of left breast: Secondary | ICD-10-CM

## 2022-09-09 DIAGNOSIS — N6342 Unspecified lump in left breast, subareolar: Secondary | ICD-10-CM | POA: Diagnosis not present

## 2022-09-09 DIAGNOSIS — N644 Mastodynia: Secondary | ICD-10-CM | POA: Diagnosis not present

## 2022-11-02 ENCOUNTER — Other Ambulatory Visit: Payer: Self-pay | Admitting: Acute Care

## 2022-11-02 DIAGNOSIS — Z1231 Encounter for screening mammogram for malignant neoplasm of breast: Secondary | ICD-10-CM

## 2022-11-04 ENCOUNTER — Ambulatory Visit
Admission: RE | Admit: 2022-11-04 | Discharge: 2022-11-04 | Disposition: A | Discharge status: Home / Self Care | Payer: BC Managed Care – PPO | Source: Ambulatory Visit | Attending: Acute Care | Admitting: Acute Care

## 2022-11-04 DIAGNOSIS — Z1231 Encounter for screening mammogram for malignant neoplasm of breast: Secondary | ICD-10-CM

## 2022-11-25 DIAGNOSIS — M545 Low back pain, unspecified: Secondary | ICD-10-CM | POA: Diagnosis not present

## 2022-12-02 DIAGNOSIS — R7303 Prediabetes: Secondary | ICD-10-CM | POA: Diagnosis not present

## 2022-12-11 DIAGNOSIS — M545 Low back pain, unspecified: Secondary | ICD-10-CM | POA: Diagnosis not present

## 2022-12-16 DIAGNOSIS — M545 Low back pain, unspecified: Secondary | ICD-10-CM | POA: Diagnosis not present

## 2022-12-31 DIAGNOSIS — M5416 Radiculopathy, lumbar region: Secondary | ICD-10-CM | POA: Diagnosis not present

## 2023-04-02 DIAGNOSIS — M545 Low back pain, unspecified: Secondary | ICD-10-CM | POA: Diagnosis not present

## 2023-04-07 DIAGNOSIS — M545 Low back pain, unspecified: Secondary | ICD-10-CM | POA: Diagnosis not present

## 2023-04-23 DIAGNOSIS — M5416 Radiculopathy, lumbar region: Secondary | ICD-10-CM | POA: Diagnosis not present

## 2023-04-27 DIAGNOSIS — M5416 Radiculopathy, lumbar region: Secondary | ICD-10-CM | POA: Diagnosis not present

## 2023-04-28 DIAGNOSIS — R7989 Other specified abnormal findings of blood chemistry: Secondary | ICD-10-CM | POA: Diagnosis not present

## 2023-04-28 DIAGNOSIS — E559 Vitamin D deficiency, unspecified: Secondary | ICD-10-CM | POA: Diagnosis not present

## 2023-04-28 DIAGNOSIS — R7303 Prediabetes: Secondary | ICD-10-CM | POA: Diagnosis not present

## 2023-05-03 DIAGNOSIS — M5416 Radiculopathy, lumbar region: Secondary | ICD-10-CM | POA: Diagnosis not present

## 2023-05-05 DIAGNOSIS — R7303 Prediabetes: Secondary | ICD-10-CM | POA: Diagnosis not present

## 2023-05-05 DIAGNOSIS — R82998 Other abnormal findings in urine: Secondary | ICD-10-CM | POA: Diagnosis not present

## 2023-05-05 DIAGNOSIS — Z1339 Encounter for screening examination for other mental health and behavioral disorders: Secondary | ICD-10-CM | POA: Diagnosis not present

## 2023-05-05 DIAGNOSIS — Z0000 Encounter for general adult medical examination without abnormal findings: Secondary | ICD-10-CM | POA: Diagnosis not present

## 2023-05-10 DIAGNOSIS — M5416 Radiculopathy, lumbar region: Secondary | ICD-10-CM | POA: Diagnosis not present

## 2023-05-11 DIAGNOSIS — M5416 Radiculopathy, lumbar region: Secondary | ICD-10-CM | POA: Diagnosis not present

## 2023-07-21 DIAGNOSIS — M9904 Segmental and somatic dysfunction of sacral region: Secondary | ICD-10-CM | POA: Diagnosis not present

## 2023-07-21 DIAGNOSIS — M9903 Segmental and somatic dysfunction of lumbar region: Secondary | ICD-10-CM | POA: Diagnosis not present

## 2023-07-21 DIAGNOSIS — M9902 Segmental and somatic dysfunction of thoracic region: Secondary | ICD-10-CM | POA: Diagnosis not present

## 2023-07-21 DIAGNOSIS — M5386 Other specified dorsopathies, lumbar region: Secondary | ICD-10-CM | POA: Diagnosis not present

## 2023-07-21 DIAGNOSIS — M51362 Other intervertebral disc degeneration, lumbar region with discogenic back pain and lower extremity pain: Secondary | ICD-10-CM | POA: Diagnosis not present

## 2023-07-21 DIAGNOSIS — M9905 Segmental and somatic dysfunction of pelvic region: Secondary | ICD-10-CM | POA: Diagnosis not present

## 2023-07-23 DIAGNOSIS — M9905 Segmental and somatic dysfunction of pelvic region: Secondary | ICD-10-CM | POA: Diagnosis not present

## 2023-07-23 DIAGNOSIS — M9904 Segmental and somatic dysfunction of sacral region: Secondary | ICD-10-CM | POA: Diagnosis not present

## 2023-07-23 DIAGNOSIS — M9903 Segmental and somatic dysfunction of lumbar region: Secondary | ICD-10-CM | POA: Diagnosis not present

## 2023-07-23 DIAGNOSIS — M5386 Other specified dorsopathies, lumbar region: Secondary | ICD-10-CM | POA: Diagnosis not present

## 2023-07-23 DIAGNOSIS — M51362 Other intervertebral disc degeneration, lumbar region with discogenic back pain and lower extremity pain: Secondary | ICD-10-CM | POA: Diagnosis not present

## 2023-07-27 DIAGNOSIS — M9903 Segmental and somatic dysfunction of lumbar region: Secondary | ICD-10-CM | POA: Diagnosis not present

## 2023-07-27 DIAGNOSIS — M51362 Other intervertebral disc degeneration, lumbar region with discogenic back pain and lower extremity pain: Secondary | ICD-10-CM | POA: Diagnosis not present

## 2023-07-27 DIAGNOSIS — M9904 Segmental and somatic dysfunction of sacral region: Secondary | ICD-10-CM | POA: Diagnosis not present

## 2023-07-27 DIAGNOSIS — M9905 Segmental and somatic dysfunction of pelvic region: Secondary | ICD-10-CM | POA: Diagnosis not present

## 2023-07-27 DIAGNOSIS — M5386 Other specified dorsopathies, lumbar region: Secondary | ICD-10-CM | POA: Diagnosis not present

## 2023-08-11 ENCOUNTER — Telehealth: Payer: Self-pay | Admitting: Adult Health

## 2023-08-30 ENCOUNTER — Encounter: Payer: BC Managed Care – PPO | Admitting: Adult Health

## 2023-09-02 ENCOUNTER — Inpatient Hospital Stay: Admitting: Adult Health

## 2023-09-21 ENCOUNTER — Other Ambulatory Visit: Payer: Self-pay | Admitting: Internal Medicine

## 2023-09-21 DIAGNOSIS — Z1231 Encounter for screening mammogram for malignant neoplasm of breast: Secondary | ICD-10-CM

## 2023-09-27 ENCOUNTER — Other Ambulatory Visit: Payer: Self-pay | Admitting: Internal Medicine

## 2023-09-27 DIAGNOSIS — Z1231 Encounter for screening mammogram for malignant neoplasm of breast: Secondary | ICD-10-CM

## 2023-10-20 ENCOUNTER — Other Ambulatory Visit: Payer: Self-pay | Admitting: Internal Medicine

## 2023-10-20 DIAGNOSIS — N644 Mastodynia: Secondary | ICD-10-CM

## 2023-10-29 DIAGNOSIS — Z01419 Encounter for gynecological examination (general) (routine) without abnormal findings: Secondary | ICD-10-CM | POA: Diagnosis not present

## 2023-11-05 ENCOUNTER — Ambulatory Visit
Admission: RE | Admit: 2023-11-05 | Discharge: 2023-11-05 | Disposition: A | Discharge status: Home / Self Care | Source: Ambulatory Visit | Attending: Internal Medicine | Admitting: Internal Medicine

## 2023-11-05 ENCOUNTER — Ambulatory Visit

## 2023-11-05 DIAGNOSIS — N644 Mastodynia: Secondary | ICD-10-CM

## 2023-11-05 DIAGNOSIS — R928 Other abnormal and inconclusive findings on diagnostic imaging of breast: Secondary | ICD-10-CM | POA: Diagnosis not present

## 2023-11-05 DIAGNOSIS — R92333 Mammographic heterogeneous density, bilateral breasts: Secondary | ICD-10-CM | POA: Diagnosis not present

## 2023-12-07 DIAGNOSIS — H524 Presbyopia: Secondary | ICD-10-CM | POA: Diagnosis not present

## 2023-12-07 DIAGNOSIS — E119 Type 2 diabetes mellitus without complications: Secondary | ICD-10-CM | POA: Diagnosis not present

## 2023-12-07 DIAGNOSIS — H5202 Hypermetropia, left eye: Secondary | ICD-10-CM | POA: Diagnosis not present
